# Patient Record
Sex: Female | Born: 1945 | Race: Black or African American | Hispanic: No | Smoking: Never smoker
Health system: Southern US, Community
[De-identification: ages and names within clinical notes are randomized; demographics above are authoritative.]

## PROBLEM LIST (undated history)

## (undated) DIAGNOSIS — J45909 Unspecified asthma, uncomplicated: Secondary | ICD-10-CM

## (undated) DIAGNOSIS — R011 Cardiac murmur, unspecified: Secondary | ICD-10-CM

## (undated) DIAGNOSIS — K5790 Diverticulosis of intestine, part unspecified, without perforation or abscess without bleeding: Secondary | ICD-10-CM

## (undated) DIAGNOSIS — G4730 Sleep apnea, unspecified: Secondary | ICD-10-CM

## (undated) DIAGNOSIS — K635 Polyp of colon: Secondary | ICD-10-CM

## (undated) DIAGNOSIS — K219 Gastro-esophageal reflux disease without esophagitis: Secondary | ICD-10-CM

## (undated) DIAGNOSIS — I10 Essential (primary) hypertension: Secondary | ICD-10-CM

## (undated) DIAGNOSIS — E785 Hyperlipidemia, unspecified: Secondary | ICD-10-CM

## (undated) HISTORY — PX: OTHER SURGICAL HISTORY: SHX169

## (undated) HISTORY — DX: Gastro-esophageal reflux disease without esophagitis: K21.9

## (undated) HISTORY — PX: TONSILLECTOMY: SUR1361

## (undated) HISTORY — DX: Polyp of colon: K63.5

## (undated) HISTORY — PX: DILATION AND CURETTAGE OF UTERUS: SHX78

## (undated) HISTORY — DX: Cardiac murmur, unspecified: R01.1

## (undated) HISTORY — PX: CATARACT EXTRACTION: SUR2

## (undated) HISTORY — DX: Diverticulosis of intestine, part unspecified, without perforation or abscess without bleeding: K57.90

## (undated) HISTORY — DX: Essential (primary) hypertension: I10

## (undated) HISTORY — DX: Hyperlipidemia, unspecified: E78.5

## (undated) HISTORY — PX: BREAST EXCISIONAL BIOPSY: SUR124

## (undated) HISTORY — DX: Sleep apnea, unspecified: G47.30

## (undated) HISTORY — PX: ROTATOR CUFF REPAIR: SHX139

## (undated) HISTORY — PX: OVARIAN CYST REMOVAL: SHX89

## (undated) HISTORY — DX: Unspecified asthma, uncomplicated: J45.909

---

## 1996-01-09 HISTORY — PX: COLONOSCOPY W/ POLYPECTOMY: SHX1380

## 1997-01-08 DIAGNOSIS — K635 Polyp of colon: Secondary | ICD-10-CM

## 1997-01-08 HISTORY — DX: Polyp of colon: K63.5

## 1997-06-11 ENCOUNTER — Other Ambulatory Visit: Admission: RE | Admit: 1997-06-11 | Discharge: 1997-06-11 | Payer: Self-pay | Admitting: *Deleted

## 1998-06-14 ENCOUNTER — Other Ambulatory Visit: Admission: RE | Admit: 1998-06-14 | Discharge: 1998-06-14 | Payer: Self-pay | Admitting: *Deleted

## 1998-10-14 ENCOUNTER — Encounter (INDEPENDENT_AMBULATORY_CARE_PROVIDER_SITE_OTHER): Payer: Self-pay | Admitting: Specialist

## 1998-10-14 ENCOUNTER — Other Ambulatory Visit: Admission: RE | Admit: 1998-10-14 | Discharge: 1998-10-14 | Payer: Self-pay | Admitting: *Deleted

## 1999-01-11 ENCOUNTER — Ambulatory Visit (HOSPITAL_COMMUNITY): Admission: RE | Admit: 1999-01-11 | Discharge: 1999-01-11 | Payer: Self-pay | Admitting: Internal Medicine

## 1999-01-11 ENCOUNTER — Encounter: Payer: Self-pay | Admitting: Internal Medicine

## 1999-03-27 ENCOUNTER — Encounter: Payer: Self-pay | Admitting: *Deleted

## 1999-03-27 ENCOUNTER — Encounter: Admission: RE | Admit: 1999-03-27 | Discharge: 1999-03-27 | Payer: Self-pay | Admitting: *Deleted

## 1999-05-26 ENCOUNTER — Ambulatory Visit (HOSPITAL_COMMUNITY): Admission: RE | Admit: 1999-05-26 | Discharge: 1999-05-26 | Payer: Self-pay | Admitting: Gastroenterology

## 1999-07-25 ENCOUNTER — Other Ambulatory Visit: Admission: RE | Admit: 1999-07-25 | Discharge: 1999-07-25 | Payer: Self-pay | Admitting: *Deleted

## 1999-11-24 ENCOUNTER — Other Ambulatory Visit: Admission: RE | Admit: 1999-11-24 | Discharge: 1999-11-24 | Payer: Self-pay | Admitting: *Deleted

## 1999-11-24 ENCOUNTER — Encounter (INDEPENDENT_AMBULATORY_CARE_PROVIDER_SITE_OTHER): Payer: Self-pay | Admitting: Specialist

## 2000-03-28 ENCOUNTER — Encounter: Admission: RE | Admit: 2000-03-28 | Discharge: 2000-03-28 | Payer: Self-pay | Admitting: *Deleted

## 2000-03-28 ENCOUNTER — Encounter: Payer: Self-pay | Admitting: *Deleted

## 2000-07-31 ENCOUNTER — Other Ambulatory Visit: Admission: RE | Admit: 2000-07-31 | Discharge: 2000-07-31 | Payer: Self-pay | Admitting: *Deleted

## 2001-04-17 ENCOUNTER — Encounter: Payer: Self-pay | Admitting: *Deleted

## 2001-04-17 ENCOUNTER — Encounter: Admission: RE | Admit: 2001-04-17 | Discharge: 2001-04-17 | Payer: Self-pay | Admitting: *Deleted

## 2001-04-30 ENCOUNTER — Ambulatory Visit (HOSPITAL_COMMUNITY): Admission: RE | Admit: 2001-04-30 | Discharge: 2001-04-30 | Payer: Self-pay | Admitting: Gastroenterology

## 2001-07-30 ENCOUNTER — Other Ambulatory Visit: Admission: RE | Admit: 2001-07-30 | Discharge: 2001-07-30 | Payer: Self-pay | Admitting: *Deleted

## 2002-04-23 ENCOUNTER — Encounter: Payer: Self-pay | Admitting: *Deleted

## 2002-04-23 ENCOUNTER — Encounter: Admission: RE | Admit: 2002-04-23 | Discharge: 2002-04-23 | Payer: Self-pay | Admitting: *Deleted

## 2002-07-21 ENCOUNTER — Other Ambulatory Visit: Admission: RE | Admit: 2002-07-21 | Discharge: 2002-07-21 | Payer: Self-pay | Admitting: *Deleted

## 2003-04-26 ENCOUNTER — Ambulatory Visit (HOSPITAL_COMMUNITY): Admission: RE | Admit: 2003-04-26 | Discharge: 2003-04-26 | Payer: Self-pay | Admitting: *Deleted

## 2003-04-29 ENCOUNTER — Encounter: Admission: RE | Admit: 2003-04-29 | Discharge: 2003-04-29 | Payer: Self-pay | Admitting: *Deleted

## 2003-08-19 ENCOUNTER — Other Ambulatory Visit: Admission: RE | Admit: 2003-08-19 | Discharge: 2003-08-19 | Payer: Self-pay | Admitting: *Deleted

## 2004-05-01 ENCOUNTER — Encounter: Admission: RE | Admit: 2004-05-01 | Discharge: 2004-05-01 | Payer: Self-pay | Admitting: *Deleted

## 2004-08-31 ENCOUNTER — Other Ambulatory Visit: Admission: RE | Admit: 2004-08-31 | Discharge: 2004-08-31 | Payer: Self-pay | Admitting: *Deleted

## 2004-09-28 ENCOUNTER — Ambulatory Visit: Payer: Self-pay | Admitting: Internal Medicine

## 2005-05-16 ENCOUNTER — Encounter: Admission: RE | Admit: 2005-05-16 | Discharge: 2005-05-16 | Payer: Self-pay | Admitting: *Deleted

## 2005-09-18 ENCOUNTER — Ambulatory Visit: Payer: Self-pay | Admitting: Internal Medicine

## 2005-09-28 ENCOUNTER — Ambulatory Visit: Payer: Self-pay | Admitting: Family Medicine

## 2005-10-09 ENCOUNTER — Ambulatory Visit: Payer: Self-pay | Admitting: Internal Medicine

## 2005-10-29 ENCOUNTER — Other Ambulatory Visit: Admission: RE | Admit: 2005-10-29 | Discharge: 2005-10-29 | Payer: Self-pay | Admitting: *Deleted

## 2006-01-29 ENCOUNTER — Ambulatory Visit: Payer: Self-pay | Admitting: Internal Medicine

## 2006-02-12 ENCOUNTER — Ambulatory Visit: Payer: Self-pay | Admitting: Internal Medicine

## 2006-02-12 LAB — CONVERTED CEMR LAB
ALT: 13 units/L (ref 0–40)
Cholesterol: 185 mg/dL (ref 0–200)
HDL: 60 mg/dL (ref >39.0)
LDL Cholesterol: 107 mg/dL — ABNORMAL HIGH (ref 0–99)

## 2006-03-20 ENCOUNTER — Emergency Department (HOSPITAL_COMMUNITY): Admission: EM | Admit: 2006-03-20 | Discharge: 2006-03-20 | Payer: Self-pay | Admitting: Family Medicine

## 2006-05-22 ENCOUNTER — Encounter: Admission: RE | Admit: 2006-05-22 | Discharge: 2006-05-22 | Payer: Self-pay | Admitting: *Deleted

## 2006-06-04 DIAGNOSIS — I10 Essential (primary) hypertension: Secondary | ICD-10-CM | POA: Insufficient documentation

## 2006-06-04 DIAGNOSIS — E785 Hyperlipidemia, unspecified: Secondary | ICD-10-CM

## 2006-06-04 DIAGNOSIS — Z86010 Personal history of colonic polyps: Secondary | ICD-10-CM | POA: Insufficient documentation

## 2006-06-21 ENCOUNTER — Telehealth (INDEPENDENT_AMBULATORY_CARE_PROVIDER_SITE_OTHER): Payer: Self-pay | Admitting: *Deleted

## 2006-07-24 ENCOUNTER — Ambulatory Visit: Payer: Self-pay | Admitting: Internal Medicine

## 2006-09-03 ENCOUNTER — Ambulatory Visit: Payer: Self-pay | Admitting: Internal Medicine

## 2006-09-03 ENCOUNTER — Telehealth (INDEPENDENT_AMBULATORY_CARE_PROVIDER_SITE_OTHER): Payer: Self-pay | Admitting: *Deleted

## 2006-09-03 DIAGNOSIS — R011 Cardiac murmur, unspecified: Secondary | ICD-10-CM

## 2006-09-05 ENCOUNTER — Encounter (INDEPENDENT_AMBULATORY_CARE_PROVIDER_SITE_OTHER): Payer: Self-pay | Admitting: *Deleted

## 2006-09-10 ENCOUNTER — Encounter (INDEPENDENT_AMBULATORY_CARE_PROVIDER_SITE_OTHER): Payer: Self-pay | Admitting: *Deleted

## 2006-09-11 ENCOUNTER — Encounter: Payer: Self-pay | Admitting: Internal Medicine

## 2006-09-11 ENCOUNTER — Ambulatory Visit: Payer: Self-pay

## 2006-09-13 ENCOUNTER — Encounter (INDEPENDENT_AMBULATORY_CARE_PROVIDER_SITE_OTHER): Payer: Self-pay | Admitting: *Deleted

## 2006-09-16 ENCOUNTER — Encounter: Payer: Self-pay | Admitting: Internal Medicine

## 2006-09-17 ENCOUNTER — Encounter (INDEPENDENT_AMBULATORY_CARE_PROVIDER_SITE_OTHER): Payer: Self-pay | Admitting: *Deleted

## 2006-09-19 ENCOUNTER — Ambulatory Visit: Payer: Self-pay

## 2006-09-19 ENCOUNTER — Encounter: Payer: Self-pay | Admitting: Internal Medicine

## 2006-09-23 ENCOUNTER — Encounter (INDEPENDENT_AMBULATORY_CARE_PROVIDER_SITE_OTHER): Payer: Self-pay | Admitting: *Deleted

## 2006-10-30 ENCOUNTER — Other Ambulatory Visit: Admission: RE | Admit: 2006-10-30 | Discharge: 2006-10-30 | Payer: Self-pay | Admitting: *Deleted

## 2006-11-04 ENCOUNTER — Telehealth (INDEPENDENT_AMBULATORY_CARE_PROVIDER_SITE_OTHER): Payer: Self-pay | Admitting: *Deleted

## 2006-12-10 ENCOUNTER — Other Ambulatory Visit: Admission: RE | Admit: 2006-12-10 | Discharge: 2006-12-10 | Payer: Self-pay | Admitting: *Deleted

## 2006-12-16 ENCOUNTER — Telehealth (INDEPENDENT_AMBULATORY_CARE_PROVIDER_SITE_OTHER): Payer: Self-pay | Admitting: *Deleted

## 2006-12-17 ENCOUNTER — Telehealth (INDEPENDENT_AMBULATORY_CARE_PROVIDER_SITE_OTHER): Payer: Self-pay | Admitting: *Deleted

## 2007-05-23 ENCOUNTER — Ambulatory Visit: Payer: Self-pay | Admitting: Internal Medicine

## 2007-05-26 ENCOUNTER — Encounter (INDEPENDENT_AMBULATORY_CARE_PROVIDER_SITE_OTHER): Payer: Self-pay | Admitting: *Deleted

## 2007-05-28 ENCOUNTER — Encounter: Admission: RE | Admit: 2007-05-28 | Discharge: 2007-05-28 | Payer: Self-pay | Admitting: Gynecology

## 2007-05-29 ENCOUNTER — Encounter: Payer: Self-pay | Admitting: Internal Medicine

## 2007-06-06 ENCOUNTER — Telehealth (INDEPENDENT_AMBULATORY_CARE_PROVIDER_SITE_OTHER): Payer: Self-pay | Admitting: *Deleted

## 2007-06-10 ENCOUNTER — Ambulatory Visit: Payer: Self-pay | Admitting: Internal Medicine

## 2007-06-12 ENCOUNTER — Encounter (INDEPENDENT_AMBULATORY_CARE_PROVIDER_SITE_OTHER): Payer: Self-pay | Admitting: *Deleted

## 2007-06-12 LAB — CONVERTED CEMR LAB
ALT: 13 units/L (ref 0–35)
AST: 22 units/L (ref 0–37)
Direct LDL: 119.7 mg/dL
Hgb A1c MFr Bld: 5.5 % (ref 4.6–6.0)
Triglycerides: 86 mg/dL (ref 0–149)
VLDL: 17 mg/dL (ref 0–40)

## 2007-06-18 ENCOUNTER — Telehealth (INDEPENDENT_AMBULATORY_CARE_PROVIDER_SITE_OTHER): Payer: Self-pay | Admitting: *Deleted

## 2007-08-27 ENCOUNTER — Telehealth (INDEPENDENT_AMBULATORY_CARE_PROVIDER_SITE_OTHER): Payer: Self-pay | Admitting: *Deleted

## 2007-10-15 ENCOUNTER — Encounter (INDEPENDENT_AMBULATORY_CARE_PROVIDER_SITE_OTHER): Payer: Self-pay | Admitting: *Deleted

## 2007-11-11 ENCOUNTER — Encounter: Payer: Self-pay | Admitting: Internal Medicine

## 2007-11-11 ENCOUNTER — Ambulatory Visit: Payer: Self-pay | Admitting: Internal Medicine

## 2007-11-17 ENCOUNTER — Other Ambulatory Visit: Admission: RE | Admit: 2007-11-17 | Discharge: 2007-11-17 | Payer: Self-pay | Admitting: Gynecology

## 2007-11-26 ENCOUNTER — Encounter (INDEPENDENT_AMBULATORY_CARE_PROVIDER_SITE_OTHER): Payer: Self-pay | Admitting: *Deleted

## 2008-03-01 ENCOUNTER — Telehealth (INDEPENDENT_AMBULATORY_CARE_PROVIDER_SITE_OTHER): Payer: Self-pay | Admitting: *Deleted

## 2008-04-19 ENCOUNTER — Ambulatory Visit: Payer: Self-pay | Admitting: Internal Medicine

## 2008-04-19 DIAGNOSIS — IMO0002 Reserved for concepts with insufficient information to code with codable children: Secondary | ICD-10-CM | POA: Insufficient documentation

## 2008-04-21 ENCOUNTER — Ambulatory Visit: Payer: Self-pay | Admitting: Internal Medicine

## 2008-04-22 ENCOUNTER — Encounter (INDEPENDENT_AMBULATORY_CARE_PROVIDER_SITE_OTHER): Payer: Self-pay | Admitting: *Deleted

## 2008-06-01 ENCOUNTER — Encounter: Admission: RE | Admit: 2008-06-01 | Discharge: 2008-06-01 | Payer: Self-pay | Admitting: Gynecology

## 2008-07-10 ENCOUNTER — Emergency Department (HOSPITAL_COMMUNITY): Admission: EM | Admit: 2008-07-10 | Discharge: 2008-07-10 | Payer: Self-pay | Admitting: Emergency Medicine

## 2008-07-16 ENCOUNTER — Telehealth (INDEPENDENT_AMBULATORY_CARE_PROVIDER_SITE_OTHER): Payer: Self-pay | Admitting: *Deleted

## 2008-07-21 ENCOUNTER — Ambulatory Visit: Payer: Self-pay | Admitting: Internal Medicine

## 2008-07-25 LAB — CONVERTED CEMR LAB
HDL: 64.4 mg/dL (ref >39.00)
Total CHOL/HDL Ratio: 3
Triglycerides: 78 mg/dL (ref 0.0–149.0)
VLDL: 15.6 mg/dL (ref 0.0–40.0)

## 2008-07-26 ENCOUNTER — Encounter (INDEPENDENT_AMBULATORY_CARE_PROVIDER_SITE_OTHER): Payer: Self-pay | Admitting: *Deleted

## 2008-07-29 ENCOUNTER — Ambulatory Visit: Payer: Self-pay | Admitting: Internal Medicine

## 2008-07-30 ENCOUNTER — Encounter: Payer: Self-pay | Admitting: Internal Medicine

## 2008-10-21 ENCOUNTER — Telehealth (INDEPENDENT_AMBULATORY_CARE_PROVIDER_SITE_OTHER): Payer: Self-pay | Admitting: *Deleted

## 2008-11-22 ENCOUNTER — Encounter: Payer: Self-pay | Admitting: Internal Medicine

## 2009-01-08 HISTORY — PX: OTHER SURGICAL HISTORY: SHX169

## 2009-02-22 ENCOUNTER — Emergency Department (HOSPITAL_COMMUNITY): Admission: EM | Admit: 2009-02-22 | Discharge: 2009-02-22 | Payer: Self-pay | Admitting: Family Medicine

## 2009-05-16 ENCOUNTER — Telehealth (INDEPENDENT_AMBULATORY_CARE_PROVIDER_SITE_OTHER): Payer: Self-pay | Admitting: *Deleted

## 2009-05-26 ENCOUNTER — Encounter: Payer: Self-pay | Admitting: Internal Medicine

## 2009-06-03 ENCOUNTER — Encounter: Admission: RE | Admit: 2009-06-03 | Discharge: 2009-06-03 | Payer: Self-pay | Admitting: Gynecology

## 2009-06-13 ENCOUNTER — Telehealth (INDEPENDENT_AMBULATORY_CARE_PROVIDER_SITE_OTHER): Payer: Self-pay | Admitting: *Deleted

## 2009-06-25 ENCOUNTER — Emergency Department (HOSPITAL_COMMUNITY): Admission: EM | Admit: 2009-06-25 | Discharge: 2009-06-25 | Payer: Self-pay | Admitting: Family Medicine

## 2009-07-12 ENCOUNTER — Telehealth (INDEPENDENT_AMBULATORY_CARE_PROVIDER_SITE_OTHER): Payer: Self-pay | Admitting: *Deleted

## 2009-08-01 ENCOUNTER — Ambulatory Visit: Payer: Self-pay | Admitting: Internal Medicine

## 2009-08-09 LAB — CONVERTED CEMR LAB
ALT: 12 units/L (ref 0–35)
AST: 18 units/L (ref 0–37)
Albumin: 4.4 g/dL (ref 3.5–5.2)
Basophils Absolute: 0 10*3/uL (ref 0.0–0.1)
Bilirubin, Direct: 0.2 mg/dL (ref 0.0–0.3)
CO2: 28 meq/L (ref 19–32)
Calcium: 9.3 mg/dL (ref 8.4–10.5)
Chloride: 107 meq/L (ref 96–112)
Cholesterol: 196 mg/dL (ref 0–200)
Eosinophils Relative: 1 % (ref 0.0–5.0)
Glucose, Bld: 102 mg/dL — ABNORMAL HIGH (ref 70–99)
HCT: 39.2 % (ref 36.0–46.0)
Hemoglobin: 13.6 g/dL (ref 12.0–15.0)
MCHC: 34.7 g/dL (ref 30.0–36.0)
MCV: 88.3 fL (ref 78.0–100.0)
Monocytes Absolute: 0.4 10*3/uL (ref 0.1–1.0)
RBC: 4.43 M/uL (ref 3.87–5.11)
RDW: 13.4 % (ref 11.5–14.6)
Sodium: 139 meq/L (ref 135–145)
TSH: 1.41 microintl units/mL (ref 0.35–5.50)
Total Bilirubin: 1 mg/dL (ref 0.3–1.2)
Total CHOL/HDL Ratio: 3
VLDL: 16.2 mg/dL (ref 0.0–40.0)

## 2009-08-12 ENCOUNTER — Ambulatory Visit: Payer: Self-pay | Admitting: Internal Medicine

## 2009-08-12 DIAGNOSIS — K5730 Diverticulosis of large intestine without perforation or abscess without bleeding: Secondary | ICD-10-CM | POA: Insufficient documentation

## 2009-08-12 DIAGNOSIS — N959 Unspecified menopausal and perimenopausal disorder: Secondary | ICD-10-CM | POA: Insufficient documentation

## 2009-09-13 ENCOUNTER — Encounter: Payer: Self-pay | Admitting: Internal Medicine

## 2009-11-25 ENCOUNTER — Ambulatory Visit: Payer: Self-pay | Admitting: Internal Medicine

## 2009-11-25 LAB — CONVERTED CEMR LAB
OCCULT 1: NEGATIVE
OCCULT 3: NEGATIVE

## 2009-11-28 ENCOUNTER — Encounter (INDEPENDENT_AMBULATORY_CARE_PROVIDER_SITE_OTHER): Payer: Self-pay | Admitting: *Deleted

## 2009-12-20 ENCOUNTER — Encounter: Payer: Self-pay | Admitting: Internal Medicine

## 2009-12-20 ENCOUNTER — Ambulatory Visit: Payer: Self-pay | Admitting: Internal Medicine

## 2010-02-05 LAB — CONVERTED CEMR LAB
CK-MB: 1.6 ng/mL (ref 0.3–4.0)
LDL Goal: 160 mg/dL

## 2010-02-09 NOTE — Medication Information (Signed)
Summary: Nonadherence with Labetalol/[facility]  Nonadherence with Labetalol/[facility]   Imported By: Lanelle Bal 06/07/2009 12:16:13  _____________________________________________________________________  External Attachment:    Type:   Image     Comment:   External Document

## 2010-02-09 NOTE — Progress Notes (Signed)
Summary: refill   Phone Note Refill Request Message from:  Patient on May 16, 2009 3:18 PM  Refills Requested: Medication #1:  LABETALOL HCL 300 MG  TABS 1 by mouth bid patient want rx for 90 day supply for mail order - mail rx to  patient   Method Requested: Mail to Patient Next Appointment Scheduled: [id number] cpx Initial call taken by: Okey Regal Spring,  May 16, 2009 3:28 PM  Follow-up for Phone Call        RX Mailed Follow-up by: Shonna Chock,  May 16, 2009 4:26 PM    Prescriptions: LABETALOL HCL 300 MG  TABS (LABETALOL HCL) 1 by mouth bid  #180 x 1   Entered by:   Shonna Chock   Authorized by:   Marga Melnick MD   Signed by:   Shonna Chock on 05/16/2009   Method used:   Print then Mail to Patient   RxID:   [id number]

## 2010-02-09 NOTE — Progress Notes (Signed)
Summary: Med Concerns  Phone Note Call from Patient Call back at Work Phone [phone]   Caller: Patient Summary of Call: Labetolol (Ivax)  is not being disgested properly, she is seeing of the pill in her stool. Her lips are also tingling, but not swollen. Normally they send her Evelene Croon instead. [facility] [address] [facility] rx and also called to [facility] at [phone], option 2.  Initial call taken by: Harold Barban,  June 13, 2009 12:53 PM  Follow-up for Phone Call        patient called back - she said to [facility] [address]  - the mfg is sandoz & [facility] m[facility]  Follow-up by: Okey Regal Spring,  June 14, 2009 8:53 AM  Additional Follow-up for Phone Call Additional follow up Details #1::        Spoke with patient and was told to disreguard info about [facility] and to just [facility] rx [facility] [address]. Patient indicated that she already checked with them and they have the manufactor that she previously had. [facility] manufactors and she has had problems every since like GI problems and Lip swelling, I offerd patient OV and she said she will wait and she how she recovers and call for OV if needed Additional Follow-up by: Shonna Chock,  June 14, 2009 10:05 AM    Prescriptions: LABETALOL HCL 300 MG  TABS (LABETALOL HCL) 1 by mouth bid  #180 x 1   Entered by:   Shonna Chock   Authorized by:   Marga Melnick MD   Signed by:   Shonna Chock on 06/14/2009   Method used:   Electronically to        [facility] [phone][facility] (retail)       [address]       [place], [place]  [id number]       Ph: [phone] or [phone]       Fax: [phone]   RxID:   [id number]

## 2010-02-09 NOTE — Miscellaneous (Signed)
Summary: Orders Update  Clinical Lists Changes  Problems: Added new problem of POSTMENOPAUSAL SYNDROME (ICD-627.9) Orders: Added new Referral order of Radiology Referral (Radiology) - Signed 

## 2010-02-09 NOTE — Progress Notes (Signed)
Summary: NEED LABS FOR 7/25 APPOINTMENT  Phone Note Call from Patient   Caller: Patient Summary of Call: PATIENT HAS APPOINTMENT TO SEE DR HOPPER FOR YEARLY EXAM ON 8/5----HAS LABS SCHEDULED FOR 7/25---  WHAT LABS SHOULD SHE HAVE ON 7/25  ??   AND I WILL PUT THEM ON HER LAB APPOINTMENT   Initial call taken by: Jerolyn Shin,  July 12, 2009 9:34 AM  Follow-up for Phone Call        V70.0, 401.9,272.4, 211.3, 995.20(lipids, BMET, CBC & dif, TSH, hep panel) Follow-up by: Marga Melnick MD,  July 13, 2009 6:02 AM  Additional Follow-up for Phone Call Additional follow up Details #1::        ADDED LABS TO APPT Additional Follow-up by: Jerolyn Shin,  July 13, 2009 3:33 PM

## 2010-02-09 NOTE — Letter (Signed)
Summary: Results Follow up Letter  [facility]  [address] [place], [place] [address]   Phone: [phone]  Fax: [phone]    11/28/2009 MRN: [id number]  [name] [address] [place], [place]  [address]  Dear Ms. Sydney Robinson,  The following are the results of your recent test(s):  Test         Result    Pap Smear:        Normal _____  Not Normal _____ Comments: ______________________________________________________ Cholesterol: LDL(Bad cholesterol):         Your goal is less than:         HDL (Good cholesterol):       Your goal is more than: Comments:  ______________________________________________________ Mammogram:        Normal _____  Not Normal _____ Comments:  ___________________________________________________________________ Hemoccult:        Normal _X____  Not normal _______ Comments:    _____________________________________________________________________ Other Tests:    We routinely do not discuss normal results over the telephone.  If you desire a copy of the results, or you have any questions about this information we can discuss them at your next office visit.   Sincerely,

## 2010-02-09 NOTE — Letter (Signed)
Summary: Cancer Screening/[facility] Personalized Risk Profile  Cancer Screening/[facility] Risk Profile   Imported By: Lanelle Bal 08/23/2009 09:32:57  _____________________________________________________________________  External Attachment:    Type:   Image     Comment:   External Document

## 2010-02-09 NOTE — Assessment & Plan Note (Signed)
Summary: cpx   Vital Signs:  Patient profile:   65 year old female Height:      67 inches Weight:      200.4 pounds BMI:     31.50 Temp:     98.6 degrees F oral Pulse rate:   63 / minute Resp:     16 per minute BP sitting:   127 / 79  (left arm) Cuff size:   large  Vitals Entered By: Shonna Chock CMA (August 12, 2009 9:52 AM)  CC: Lipid Management   CC:  Lipid Management.  History of Present Illness: Sydney Robinson is here for a physical; she is asymptomatic except for some edema. Her daughter's health issues have been a stress.  Hypertension Follow-Up      This is a 65 year old woman who presents for Hypertension follow-up.  The patient reports lightheadedness, but denies urinary frequency, headaches, and fatigue.  Associated symptoms include pedal edema.  The patient denies the following associated symptoms: chest pain, chest pressure, exercise intolerance, dyspnea, palpitations, syncope, and leg edema.  Compliance with medications (by patient report) has been near 100%.  The patient reports that dietary compliance has been fair.  Adjunctive measures currently used by the patient include salt restriction. BP 125-130/84 @ home.  Hyperlipidemia Follow-Up      The patient also presents for Hyperlipidemia follow-up.  The patient denies muscle aches, GI upset, abdominal pain, flushing, itching, constipation, and diarrhea.  The patient denies the following symptoms: syncope.  Compliance with medications (by patient report) has been near 100%.  Adjunctive measures currently used by the patient include fish oil supplements minimally.    Lipid Management History:      Positive NCEP/ATP III risk factors include female age 63 years old or older and hypertension.  Negative NCEP/ATP III risk factors include no history of early menopause without estrogen hormone replacement, non-diabetic, HDL cholesterol greater than 60, no family history for ischemic heart disease, non-tobacco-user status, no ASHD  (atherosclerotic heart disease), no prior stroke/TIA, no peripheral vascular disease, and no history of aortic aneurysm.     Preventive Screening-Counseling & Management  Caffeine-Diet-Exercise     Does Patient Exercise: yes  Allergies (verified): No Known Drug Allergies  Past History:  Past Medical History: Colonic polyps, PMH of, Dr Kinnie Scales 2003 Hyperlipidemia: [place] Study LDL goal = < 160. Hypertension MURMUR, CARDIAC, UNDIAGNOSED (ICD-785.2) Diverticulosis, colon, 2008  Past Surgical History: Fibroidectomy X3; D&C X3; G 2 P 2; Ovarian Cystectomy, Dr Randell Patient Colon polypectomy X1, adenomatous 2003; Sigmoid diverticula 2008; repeat colonoscopy 2013 Rotator cuff repair bilaterally  Tonsillectomy Neuroma resected R foot  Family History: Mother:  DM,Alsheimer's,CHF; M aunt: MI @ 67, P aunt :CVA @ 31; P aunt :uterine cancer; 2 sisters: breast cancer;1 sister: Diverticulitis;1 sister : ? gastroenteritis; MGM: DM;MGGM: DM; M aunts & uncles: DM; P aunt: DM  Social History: Never Smoked Alcohol use-no Retired Regular exercise-yes: walking 3-4 X/ week Married Does Patient Exercise:  yes  Review of Systems  The patient denies anorexia, fever, weight loss, weight gain, vision loss, decreased hearing, hoarseness, prolonged cough, hemoptysis, melena, hematochezia, severe indigestion/heartburn, hematuria, incontinence, suspicious skin lesions, depression, unusual weight change, abnormal bleeding, enlarged lymph nodes, and angioedema.    Physical Exam  General:  well-nourished; alert,appropriate and cooperative throughout examination Head:  Normocephalic and atraumatic without obvious abnormalities.  Eyes:  No corneal or conjunctival inflammation noted. EOMI. Perrla. Funduscopic exam benign, without hemorrhages, exudates or papilledema. Vision grossly normal. Ears:  External  ear exam shows no significant lesions or deformities.  Otoscopic examination reveals clear canals, tympanic  membranes are intact bilaterally without bulging, retraction, inflammation or discharge. Hearing is grossly normal bilaterally. Nose:  External nasal examination shows no deformity or inflammation. Nasal mucosa are pink and moist without lesions or exudates. Mouth:  Oral mucosa and oropharynx without lesions or exudates.  Teeth in good repair. Neck:  No deformities, masses, or tenderness noted. Thyroid full Lungs:  Normal respiratory effort, chest expands symmetrically. Lungs are clear to auscultation, no crackles or wheezes. Heart:  Normal rate and regular rhythm. S1 and S2 normal without gallop, murmur, click, rub . S4 with slurring Abdomen:  Bowel sounds positive,abdomen soft and non-tender without masses, organomegaly or hernias noted. Genitalia:  Dr Chevis Pretty Msk:  No deformity or scoliosis noted of thoracic or lumbar spine.   Pulses:  R and L carotid,radial,dorsalis pedis and posterior tibial pulses are full and equal bilaterally Extremities:  No clubbing, cyanosis, edema, or deformity noted with normal full range of motion of all joints.  Crepitus of knees , L > R Neurologic:  alert & oriented X3 and DTRs symmetrical and normal.   Skin:  Intact without suspicious lesions or rashes Cervical Nodes:  No lymphadenopathy noted Axillary Nodes:  No palpable lymphadenopathy Psych:  memory intact for recent and remote, normally interactive, and good eye contact.     Impression & Recommendations:  Problem # 1:  ROUTINE GENERAL MEDICAL EXAM@HEALTH  CARE FACL (ICD-V70.0)  Orders: EKG w/ Interpretation (93000)  Problem # 2:  HYPERTENSION (ICD-401.9) Controlled Her updated medication list for this problem includes:    Labetalol Hcl 300 Mg Tabs (Labetalol hcl) .Marland Kitchen... 1 by mouth bid  Problem # 3:  HYPERLIPIDEMIA (ICD-272.4) Lipids @ goal Her updated medication list for this problem includes:    Simvastatin 40 Mg Tabs (Simvastatin) .Marland Kitchen... 1 by mouth qd  Problem # 4:  COLONIC POLYPS, HX OF  (ICD-V12.72) as per Dr Kinnie Scales repeat colonoscopy 2013  Complete Medication List: 1)  Simvastatin 40 Mg Tabs (Simvastatin) .Marland Kitchen.. 1 by mouth qd 2)  Labetalol Hcl 300 Mg Tabs (Labetalol hcl) .Marland Kitchen.. 1 by mouth bid 3)  Zyrtec Allergy 10 Mg Tabs (Cetirizine hcl) .Marland Kitchen.. 1 by mouth once daily as needed 4)  Clonazepam 0.5 Mg Tabs (Clonazepam) .... 1/2 to 1 tab hs prn 5)  Multivitamin  6)  Fish Oil  7)  Cyclobenzaprine Hcl 5 Mg Tabs (Cyclobenzaprine hcl) .Marland Kitchen.. 1-2 at bedtime 8)  Caltrate 600 1500 Mg Tabs (Calcium carbonate) .Marland Kitchen.. 1 by mouth once daily  Lipid Assessment/Plan:      Based on NCEP/ATP III, the patient's risk factor category is "0-1 risk factors".  The patient's lipid goals are as follows: Total cholesterol goal is 200; LDL cholesterol goal is 160; HDL cholesterol goal is 40; Triglyceride goal is 150.  Her LDL cholesterol goal has been met.    Patient Instructions: 1)  Check your Blood Pressure regularly. If it is above: 135/85 ON AVERAGE you should make an appointment.Please complete stool cards. Prescriptions: CLONAZEPAM 0.5 MG  TABS (CLONAZEPAM) 1/2 to 1 tab hs prn  #30 x 5   Entered and Authorized by:   Marga Melnick MD   Signed by:   Marga Melnick MD on 08/12/2009   Method used:   Print then Give to Patient   RxID:   [id number] LABETALOL HCL 300 MG  TABS (LABETALOL HCL) 1 by mouth bid  #180 x 3   Entered and Authorized by:  Marga Melnick MD   Signed by:   Marga Melnick MD on 08/12/2009   Method used:   Print then Give to Patient   RxID:   [id number] SIMVASTATIN 40 MG  TABS (SIMVASTATIN) 1 by mouth qd  #90 x 3   Entered and Authorized by:   Marga Melnick MD   Signed by:   Marga Melnick MD on 08/12/2009   Method used:   Print then Give to Patient   RxID:   [id number]

## 2010-02-09 NOTE — Letter (Signed)
Summary: Results Follow up Letter  [facility]  [address] [place], [place] [address]   Phone: [phone]  Fax: [phone]    11/26/2007 MRN: [id number]  Sydney Robinson [address] [place], [place]  [address]  Dear Ms. Raul Del,  The following are the results of your recent test(s):  Test         Result    Pap Smear:        Normal _____  Not Normal _____ Comments: ______________________________________________________ Cholesterol: LDL(Bad cholesterol):         Your goal is less than:         HDL (Good cholesterol):       Your goal is more than: Comments:  ______________________________________________________ Mammogram:        Normal _____  Not Normal _____ Comments:  ___________________________________________________________________ Hemoccult:        Normal _____  Not normal _______ Comments:    _____________________________________________________________________ Other Tests: PLEASE SEE NORMAL BONE DENSITY TEST RESULTS ATTACHED    We routinely do not discuss normal results over the telephone.  If you desire a copy of the results, or you have any questions about this information we can discuss them at your next office visit.   Sincerely,

## 2010-02-09 NOTE — Miscellaneous (Signed)
Summary: BONE DENSITY  Clinical Lists Changes  Orders: Added new Test order of T-Bone Densitometry (77080) - Signed Added new Test order of T-Lumbar Vertebral Assessment (77082) - Signed 

## 2010-04-08 ENCOUNTER — Encounter: Payer: Self-pay | Admitting: Internal Medicine

## 2010-04-11 ENCOUNTER — Ambulatory Visit (INDEPENDENT_AMBULATORY_CARE_PROVIDER_SITE_OTHER): Payer: BC Managed Care – PPO | Admitting: Internal Medicine

## 2010-04-11 ENCOUNTER — Encounter: Payer: Self-pay | Admitting: Internal Medicine

## 2010-04-11 VITALS — BP 108/70 | HR 76 | Temp 98.7°F | Wt 202.4 lb

## 2010-04-11 DIAGNOSIS — D126 Benign neoplasm of colon, unspecified: Secondary | ICD-10-CM

## 2010-04-11 DIAGNOSIS — R198 Other specified symptoms and signs involving the digestive system and abdomen: Secondary | ICD-10-CM

## 2010-04-11 DIAGNOSIS — R194 Change in bowel habit: Secondary | ICD-10-CM

## 2010-04-11 NOTE — Patient Instructions (Signed)
Stop the Neosporin; it has a high risk for causing allergic reactions. Use the vitamin infused  A&D ointment as per the package label. Please complete the stool cards. Take Align once daily; this is a probiotic which will replace normal organisms which should be in the gut. Monitor to make sure that there is no change in bowels related to increased wheat product intake.

## 2010-04-11 NOTE — Progress Notes (Signed)
  Subjective:    Patient ID: Sydney Robinson, female    DOB: 04-08-1945, 65 y.o.   MRN: [id number]  HPI see below    Review of Systemssee below; also cracking @  Corner of mouth on L X 5 days,Rx: Neosporin     Objective:   Physical Exam Loose stool  Onset: 6-8 mos ago;Time course: stable over this period of time;Description: loose but not watery Number of stools per day: 2  Previous treatment:  decreased coffee intake w/o change; Pepto Bismol occasionally helped "extra loose stool" Vomiting: no  Abdominal Pain: no  Weight loss: no  Decreased urine output: no  Lightheadedness: not persistant  ( only related to BP meds)  Recent travel history: no    Sick contacts: no    Suspicious food or water: no (well water used only for cooking). Change in diet: no ;no relation to wheat; she is lactose intolerant & avoids milk except for occasional cheese   Red Flags Fever: no  Bloody stools: no  Recent antibiotics: no  Immuncompromised: no  but  PMH of pre cancerous polyps(Tubular Adenoma 1998) & diverticulosis. Repeat colonoscopy 2003 by Dr Kinnie Scales & by Dr Arlyce Dice  2008; FH of cancers:breast , ovarian , pancreatic , prostate . No FH of  colon cancer; sister had Diverticulitis.  PE: well nourished appearing;in no distress.Eye - Pupils Equal Round Reactive to light, Extraocular movements intact.No icterus. Conjunctiva without redness or discharge Neck:  No deformities, thyromegaly, masses, or tenderness noted.   Good dental hygiene; no oropharyngeal erythema;minor cheilosis corner L mouth..Heart:  Normal rate and regular rhythm. S1 and S2 normal without gallop, murmur, click, rub or other extra sounds.  Lungs:Chest clear to auscultation; no wheezes, rhonchi,rales ,or rubs present.No increased work of breathing. Abdomen: bowel sounds normal, soft and non-tender without masses, organomegaly or hernias noted.  No guarding or rebound Skin:  Intact without suspicious lesions or rashes. No jaundice. No LA  of neck or axilla.   Assessment & Plan:  #1 bowel changes with loose stool; no true diarrhea  #2 past medical history of adenomatous polyps in 1998  #3 cheilosis of lip  Plan: #1 CBC and differential will be collected. Align one daily will be recommended for bowel changes. Stool fecal occult blood will also be collected. Referral to GI will be pursued if this does not resolve her symptoms.  Vitamin A and D ointment will be recommended for the lip lesion. She was asked to stop the Neosporin because of increased risk of drug reaction.

## 2010-04-12 LAB — CBC WITH DIFFERENTIAL/PLATELET
Basophils Absolute: 0 10*3/uL (ref 0.0–0.1)
HCT: 39.7 % (ref 36.0–46.0)
Hemoglobin: 13.8 g/dL (ref 12.0–15.0)
MCHC: 34.6 g/dL (ref 30.0–36.0)
MCV: 88.3 fl (ref 78.0–100.0)
Monocytes Relative: 9.2 % (ref 3.0–12.0)
Neutro Abs: 2 10*3/uL (ref 1.4–7.7)
Neutrophils Relative %: 46.7 % (ref 43.0–77.0)
Platelets: 217 10*3/uL (ref 150.0–400.0)
RBC: 4.5 Mil/uL (ref 3.87–5.11)

## 2010-05-10 ENCOUNTER — Other Ambulatory Visit (INDEPENDENT_AMBULATORY_CARE_PROVIDER_SITE_OTHER): Payer: BC Managed Care – PPO

## 2010-05-10 DIAGNOSIS — Z1211 Encounter for screening for malignant neoplasm of colon: Secondary | ICD-10-CM

## 2010-05-10 LAB — HEMOCCULT GUIAC POC 1CARD (OFFICE)
Card #3 Fecal Occult Blood, POC: NEGATIVE
Fecal Occult Blood, POC: NEGATIVE

## 2010-05-18 ENCOUNTER — Other Ambulatory Visit: Payer: Self-pay | Admitting: Gynecology

## 2010-05-18 DIAGNOSIS — Z1231 Encounter for screening mammogram for malignant neoplasm of breast: Secondary | ICD-10-CM

## 2010-05-26 NOTE — Procedures (Signed)
[facility]  Patient:    LATISIA, HILAIRE                     MRN: [id number] Proc. Date: 05/26/99 Adm. Date:  [id number] Disc. Date: [id number] Attending:  Judeth Cornfield                           Procedure Report  HISTORY OF PRESENT ILLNESS:  Ms. Eliberto Ivory has a history of colon polyps. She was last studied 3 years ago. She has a sister with colon polyps. She has no current GI complaints.  INFORMED CONSENT:  The patient provided consent after risks, benefits, and alternatives were explained.  MEDICATIONS:  Versed 7.5, fentanyl 75 mcg IV.  DESCRIPTION OF PROCEDURE:  The patient was placed in the left lateral decubitus position, administered continuous low flow oxygen and was placed on pulse oximetry. The Olympus video colonoscope was inserted to the cecum which was identified by the ileocecal valve. The prep was good. The scope was then withdrawn and all areas of the colon were examined.  FINDINGS:  Normal colonoscopy to the cecum.  IMPRESSION:  Normal colonoscopy. History of colon polyps.  RECOMMENDATION: Follow-up colonoscopy in approximately 5 years. DD:  03/26/99 TD:  05/31/99 Job: [id number]

## 2010-06-07 ENCOUNTER — Ambulatory Visit
Admission: RE | Admit: 2010-06-07 | Discharge: 2010-06-07 | Disposition: A | Discharge status: Home / Self Care | Payer: BC Managed Care – PPO | Source: Ambulatory Visit | Attending: Gynecology | Admitting: Gynecology

## 2010-06-07 DIAGNOSIS — Z1231 Encounter for screening mammogram for malignant neoplasm of breast: Secondary | ICD-10-CM

## 2010-11-14 ENCOUNTER — Encounter: Payer: Self-pay | Admitting: Internal Medicine

## 2010-11-15 ENCOUNTER — Ambulatory Visit (INDEPENDENT_AMBULATORY_CARE_PROVIDER_SITE_OTHER): Payer: BC Managed Care – PPO | Admitting: Internal Medicine

## 2010-11-15 VITALS — BP 138/86 | HR 64 | Temp 98.3°F | Ht 66.5 in | Wt 199.0 lb

## 2010-11-15 DIAGNOSIS — I10 Essential (primary) hypertension: Secondary | ICD-10-CM

## 2010-11-15 DIAGNOSIS — D72819 Decreased white blood cell count, unspecified: Secondary | ICD-10-CM

## 2010-11-15 DIAGNOSIS — E785 Hyperlipidemia, unspecified: Secondary | ICD-10-CM

## 2010-11-15 DIAGNOSIS — G479 Sleep disorder, unspecified: Secondary | ICD-10-CM

## 2010-11-15 DIAGNOSIS — Z23 Encounter for immunization: Secondary | ICD-10-CM

## 2010-11-15 LAB — HEPATIC FUNCTION PANEL
Albumin: 4.4 g/dL (ref 3.5–5.2)
Total Bilirubin: 1.5 mg/dL — ABNORMAL HIGH (ref 0.3–1.2)

## 2010-11-15 LAB — CBC WITH DIFFERENTIAL/PLATELET
Basophils Relative: 0.4 % (ref 0.0–3.0)
Eosinophils Relative: 1.2 % (ref 0.0–5.0)
HCT: 39.3 % (ref 36.0–46.0)
MCV: 89.5 fl (ref 78.0–100.0)
Monocytes Absolute: 0.4 10*3/uL (ref 0.1–1.0)
Monocytes Relative: 9.6 % (ref 3.0–12.0)
Neutrophils Relative %: 47.7 % (ref 43.0–77.0)
Platelets: 208 10*3/uL (ref 150.0–400.0)
RBC: 4.39 Mil/uL (ref 3.87–5.11)
WBC: 4.3 10*3/uL — ABNORMAL LOW (ref 4.5–10.5)

## 2010-11-15 LAB — BASIC METABOLIC PANEL
BUN: 11 mg/dL (ref 6–23)
CO2: 28 mEq/L (ref 19–32)
Glucose, Bld: 84 mg/dL (ref 70–99)
Potassium: 4.2 mEq/L (ref 3.5–5.1)
Sodium: 143 mEq/L (ref 135–145)

## 2010-11-15 LAB — TSH: TSH: 1.24 u[IU]/mL (ref 0.35–5.50)

## 2010-11-15 LAB — LIPID PANEL
HDL: 69.8 mg/dL (ref >39.00)
LDL Cholesterol: 107 mg/dL — ABNORMAL HIGH (ref 0–99)
Total CHOL/HDL Ratio: 3
VLDL: 13.8 mg/dL (ref 0.0–40.0)

## 2010-11-15 MED ORDER — SIMVASTATIN 40 MG PO TABS: 40.0000 mg | ORAL_TABLET | Freq: Every day | ORAL | Status: DC

## 2010-11-15 MED ORDER — LABETALOL HCL 300 MG PO TABS: 300.0000 mg | ORAL_TABLET | Freq: Two times a day (BID) | ORAL | Status: DC

## 2010-11-15 MED ORDER — CLONAZEPAM 0.5 MG PO TABS: 0.5000 mg | ORAL_TABLET | ORAL | Status: DC

## 2010-11-15 NOTE — Patient Instructions (Signed)
Preventive Health Care: Exercise  30-45  minutes a day, 3-4 days a week. Walking is especially valuable in preventing Osteoporosis. Eat a low-fat diet with lots of fruits and vegetables, up to 7-9 servings per day. Consume less than 30 grams of sugar per day from foods & drinks with High Fructose Corn Syrup as # 1,2,3 or #4 on label. Blood Pressure Goal  Ideally is an AVERAGE < 135/85. This AVERAGE should be calculated from @ least 5-7 BP readings taken @ different times of day on different days of week. You should not respond to isolated BP readings , but rather the AVERAGE for that week To prevent sleep dysfunction follow these instructions for sleep hygiene. Do not read, watch TV, or eat in bed. Do not get into bed until you are ready to turn off the light &  to go to sleep. Do not ingest stimulants ( decongestants, diet pills, nicotine, caffeine) after the evening meal.

## 2010-11-15 NOTE — Progress Notes (Signed)
  Subjective:    Patient ID: Sydney Robinson, female    DOB: 12/06/1945, 65 y.o.   MRN: [id number]  HPI  HYPERTENSION: Disease Monitoring  Blood pressure range: < 135/85 on average; 117/72- 140/87  Chest pain: no   Dyspnea: no   Claudication: no   Medication compliance: yes,   Medication Side Effects  Lightheadedness: rarely when standing up   Urinary frequency: no   Edema: minor    Preventitive Healthcare:  Exercise: yes, walking 3-4 X/ week   Diet Pattern: no plan, heart healthy  Salt Restriction: yes     Review of Systems She questions the decrease in her white count on the last 2 CBCs and differentials. Review of of labs reveals that her white blood count has ranged from 4000-4200. In July 2011 she had an increase in lymphocytes at 46.5. This resolved in April 2012 .She denies fever, chills sweats, or weight loss.  She's been taking the clonazepam for sleep disorder on a very irregular basis to good effect.     Objective:   Physical Exam Gen.: Healthy and well-nourished in appearance. Alert, appropriate and cooperative throughout exam. Lungs: Normal respiratory effort; chest expands symmetrically. Lungs are clear to auscultation without rales, wheezes, or increased work of breathing. Heart: Normal rate and rhythm. Normal S1 and S2. No gallop, click, or rub. s 4 w/o  murmur. Abdomen: Bowel sounds normal; abdomen soft and nontender. No masses, organomegaly or hernias noted. No AAA or renal artery bruits                                                                                 Musculoskeletal/extremities:  No clubbing, cyanosis, edema, or deformity noted.  Nail health  good. Vascular: Carotid, radial artery, dorsalis pedis and  posterior tibial pulses are full and equal. No bruits present. Neurologic: Alert and oriented x3. Deep tendon reflexes symmetrical and normal.          Skin: Intact without suspicious lesions or rashes.  Psych: Mood and affect are normal.  Normally interactive                                                                                         Assessment & Plan:  #1 hypertension, control is excellent based on measurements  #2 reduced white blood count with essentially normal differential. No organomegaly or lymphadenopathy on exam  Plan: See orders and recommendations.

## 2011-04-01 ENCOUNTER — Emergency Department (INDEPENDENT_AMBULATORY_CARE_PROVIDER_SITE_OTHER)
Admission: EM | Admit: 2011-04-01 | Discharge: 2011-04-01 | Disposition: A | Discharge status: Home / Self Care | Payer: BC Managed Care – PPO | Source: Home / Self Care | Attending: Emergency Medicine | Admitting: Emergency Medicine

## 2011-04-01 ENCOUNTER — Emergency Department (INDEPENDENT_AMBULATORY_CARE_PROVIDER_SITE_OTHER): Payer: BC Managed Care – PPO

## 2011-04-01 ENCOUNTER — Encounter (HOSPITAL_COMMUNITY): Payer: Self-pay

## 2011-04-01 DIAGNOSIS — J45909 Unspecified asthma, uncomplicated: Secondary | ICD-10-CM

## 2011-04-01 MED ORDER — BENZONATATE 200 MG PO CAPS: 200.0000 mg | ORAL_CAPSULE | Freq: Three times a day (TID) | ORAL | Status: AC | PRN

## 2011-04-01 MED ORDER — AZITHROMYCIN 250 MG PO TABS: ORAL_TABLET | ORAL | Status: AC

## 2011-04-01 MED ORDER — ALBUTEROL SULFATE HFA 108 (90 BASE) MCG/ACT IN AERS: 1.0000 | INHALATION_SPRAY | Freq: Four times a day (QID) | RESPIRATORY_TRACT | Status: DC | PRN

## 2011-04-01 NOTE — ED Notes (Signed)
Pt has had cough for 3 days and has wheezing at hs

## 2011-04-01 NOTE — ED Provider Notes (Signed)
Chief Complaint  Patient presents with  . Cough    History of Present Illness:   Sydney Robinson is a 66 year old female who has had a four-day history of cough productive of yellowish, bloody drainage, wheezing at nighttime, ear congestion, sore throat, and nasal congestion with clear drainage. She also has had some headaches, but denies any fevers, chills, chest pain, nausea, vomiting, or diarrhea.  Review of Systems:  Other than noted above, the patient denies any of the following symptoms. Systemic:  No fever, chills, sweats, fatigue, myalgias, headache, or anorexia. Eye:  No redness, pain or drainage. ENT:  No earache, nasal congestion, rhinorrhea, sinus pressure, or sore throat. Lungs:  No cough, sputum production, wheezing, shortness of breath. Or chest pain. GI:  No nausea, vomiting, abdominal pain or diarrhea. Skin:  No rash or itching.  PMFSH:  Past medical history, family history, social history, meds, and allergies were reviewed.  Physical Exam:   Vital signs:  BP 116/78  Pulse 65  Temp(Src) 98.5 F (36.9 C) (Oral)  Resp 16  SpO2 98% General:  Alert, in no distress. Eye:  No conjunctival injection or drainage. ENT:  TMs and canals were normal, without erythema or inflammation.  Nasal mucosa was clear and uncongested, without drainage.  Mucous membranes were moist.  Pharynx was clear, without exudate or drainage.  There were no oral ulcerations or lesions. Neck:  Supple, no adenopathy, tenderness or mass. Lungs:  No respiratory distress.  Lungs were clear to auscultation, without wheezes, rales or rhonchi.  Breath sounds were clear and equal bilaterally. Heart:  Regular rhythm, without gallops, murmers or rubs. Skin:  Clear, warm, and dry, without rash or lesions.   Radiology:  Dg Chest 2 View  04/01/2011  *RADIOLOGY REPORT*  Clinical Data: Cough, wheezing, hemoptysis, no fever  CHEST - 2 VIEW  Comparison: 07/10/2008  Findings: Grossly unchanged borderline enlarged cardiac  silhouette with persistent mild tortuosity/ectasia of the thoracic aorta.  No focal parenchymal opacities.  No pleural effusion or pneumothorax. No acute osseous abnormalities.  IMPRESSION: 1.  No acute cardiopulmonary disease.  2.  Grossly unchanged borderline cardiomegaly with mild tortuosity/ectasia of the thoracic aorta.  Original Report Authenticated By: Waynard Reeds, M.D.    Assessment:   Diagnoses that have been ruled out:  None  Diagnoses that are still under consideration:  None  Final diagnoses:  Asthmatic bronchitis      Plan:   1.  The following meds were prescribed:   New Prescriptions   ALBUTEROL (PROVENTIL HFA;VENTOLIN HFA) 108 (90 BASE) MCG/ACT INHALER    Inhale 1-2 puffs into the lungs every 6 (six) hours as needed for wheezing.   AZITHROMYCIN (ZITHROMAX Z-PAK) 250 MG TABLET    Take as directed.   BENZONATATE (TESSALON) 200 MG CAPSULE    Take 1 capsule (200 mg total) by mouth 3 (three) times daily as needed for cough.   2.  The patient was instructed in symptomatic care and handouts were given. 3.  The patient was told to return if becoming worse in any way, if no better in 3 or 4 days, and given some red flag symptoms that would indicate earlier return.   Reuben Likes, MD 04/01/11 [phone]

## 2011-04-01 NOTE — Discharge Instructions (Signed)
Using Your Inhaler 1. Take the cap off the mouthpiece.  2. Shake the inhaler for 5 seconds.  3. Turn the inhaler so the bottle is above the mouthpiece. Hold it away from your mouth, at a distance of the width of 2 fingers.  4. Open your mouth widely, and tilt your head back slightly. Let your breath out.  5. Take a deep breath in slowly through your mouth. At the same time, push down on the bottle 1 time. You will feel the medicine enter your mouth and throat as you breathe.  6. Continue to take a deep breath in very slowly.  7. After you have breathed in completely, hold your breath for 10 seconds. This will help the medicine to settle in your lungs. If you cannot hold your breath for 10 seconds, hold it for as long as you can before you breathe out.  8. If your doctor has told you to take more than 1 puff, wait at least 1 minute between puffs. This will help you get the best results from your medicine.  9. If you use a steroid inhaler, rinse out your mouth after each dose.  10. Wash your inhaler once a day. Remove the bottle from the mouthpiece. Rinse the mouthpiece and cap with warm water. Dry everything well before you put the inhaler back together.  Document Released: 10/04/2007 Document Revised: 12/14/2010 Document Reviewed: 10/12/2008 [facility] Patient Information 2012 [place], [place].  Most upper respiratory infections are caused by viruses and do not require antibiotics.  We try to save the antibiotics for when we really need them to avoid resistance.  This does not mean that there is nothing that can be done.  Here are a few hints about things that can be done at home to get over an upper respiratory infection quicker:  Get extra sleep and extra fluids.  Get 7 to 9 hours of sleep per night and 6 to 8 glasses of water a day.  Getting extra sleep keeps the immune system from getting run down.  Most people with an upper respiratory infection are a little dehydrated.  The extra fluids also keep  the secretions liquified and easier to deal with.  Also, get extra vitamin C.  4000 mg per day is the recommended dose. For the aches, headache, and fever, acetaminophen or ibuprofen are helpful.  These can be alternated every 4 hours.  People with liver disease should avoid large amounts of acetaminophen, and people with ulcer disease, gastroesophageal reflux, gastritis, congestive heart failure, chronic kidney disease, coronary artery disease and the elderly should avoid ibuprofen. For nasal congestion try Mucinex-D, or if you're having lots of sneezing or copious clear nasal drainage Allegra-D-24 hour.  A Saline nasal spray such as Ocean Spray can also help as can decongestant sprays such as Afrin, but you should not use the decongestant sprays for more than 3 or 4 days since they can be habituating.  If nasal dryness is a problem, Ayr Nasal Gel can help moisturize your nasal passages.  Breath Rite nasal strips can also offer a non-drug alternative treatment to nasal congestion, especially at night. For people with symptoms of sinusitis, sleeping with your head elevated can be helpful.  For sinus pain, moist, hot compresses to the face may provide some relief.  Many people find that inhaling steam as in a shower or from a pot of steaming water can help. For sore throat, zinc containing lozenges such as Cold-Eze or Zicam are helpful.  Zinc  helps to fight infection and has a mild astringent effect that relieves the sore, achey throat.  Hot salt water gargles (8 oz of hot water, 1/2 tsp of table salt, and a pinch of baking soda) can give relief as well as hot beverages such as hot tea. For the cough, old time remedies such as honey or honey and lemon are tried and true.  Over the counter cough syrups such as Delsym 2 tsp every 12 hours can help as well.  It's important when you have an upper respiratory infection not to pass the infection to others.  This involves being very careful about the  following:  Frequent hand washing or use of hand sanitizer, especially after coughing, sneezing, blowing your nose or touching your face, nose or eyes. Do not shake hands or touch anyone and try to avoid touching surfaces that other people use such as doorknobs, shopping carts, telephones and computer keyboards. Use tissues and dispose of them properly in a garbage can or ziplock bag. Cough into your sleeve. Do not let others eat or drink after you.  It's also important to recognize the signs of serious illness and get evaluated if they occur: Any respiratory infection that lasts more than 7 to 10 days.  Yellow nasal drainage and sputum are not reliable indicators of a bacterial infection, but if they last for more than 1 week, see your doctor. Fever and sore throat can indicate strep. Fever and cough can indicate influenza or pneumonia. Any kind of severe symptom such as difficulty breathing, intractable vomiting, or severe pain should prompt you to see a doctor as soon as possible.   Your body's immune system is really the thing that will get rid of this infection.  Your immune system is comprised of 2 types of specialized cells called T cells and B cells.  T cells coordinate the array of cells in your body that engulf invading bacteria or viruses while B cells orchestrate the production of antibodies that neutralize infection.  Anything we do or any medications we give you, will just strengthen your immune system or help it clear up the infection quicker.  Here are a few helpful hints to improve your immune system to help overcome this illness or to prevent future infections:  A few vitamins can improve the health of your immune system.  That's why your diet should include plenty of fruits, vegetables, fish, nuts, and whole grains.  Vitamin A and bet-carotene can increase the cells that fight infections (T cells and B cells).  Vitamin A is abundant in dark greens and orange vegetables such as  spinach, greens, sweet potatoes, and carrots.  Vitamin B6 contributes to the maturation of white blood cells, the cells that fight disease.  Foods with vitamin B6 include cold cereal and bananas.  Vitamin C is credited with preventing colds because it increases white blood cells and also prevents cellular damage.  Citrus fruits, peaches and green and red bell peppers are all hight in vitamin C.  Vitamin E is an anti-oxidant that encourages the production of natural killer cells which reject foreign invaders and B cells that produce antibodies.  Foods high in vitamin E include wheat germ, nuts and seeds.  Foods high in omega-3 fatty acids found in foods like salmon, tuna and mackerel boost your immune system and help cells to engulf and absorb germs.  Probiotics are good bacteria that increase your T cells.  These can be found in yogurt and are available in  supplements such as Culturelle or Align.  Moderate exercise increases the strength of your immune system and your ability to recover from illness.  I suggest 3 to 5 moderate intensity 30 minute workouts per week.    Sleep is another component of maintaining a strong immune system.  It enables your body to recuperate from the day's activities, stress and work.  My recommendation is to get between 7 and 9 hours of sleep per night.  If you smoke, try to quit completely or at least cut down.  Drink alcohol only in moderation if at all.  No more than 2 drinks daily for men or 1 for women.  Get a flu vaccine early in the fall or if you have not gotten one yet, once this illness has run its course.  If you are over 65, a smoker, or an asthmatic, get a pneumococcal vaccine.  My final recommendation is to maintain a healthy weight.  Excess weight can impair the immune system by interfering with the way the immune system deals with invading viruses or bacteria.

## 2011-04-30 ENCOUNTER — Telehealth: Payer: Self-pay | Admitting: Internal Medicine

## 2011-04-30 NOTE — Telephone Encounter (Signed)
All Dr.Hopper Records faxed to [facility]  @ [phone]  04/30/11/KM

## 2011-05-18 ENCOUNTER — Other Ambulatory Visit: Payer: Self-pay | Admitting: Gynecology

## 2011-05-18 DIAGNOSIS — Z1231 Encounter for screening mammogram for malignant neoplasm of breast: Secondary | ICD-10-CM

## 2011-06-07 ENCOUNTER — Encounter: Payer: Self-pay | Admitting: Family Medicine

## 2011-06-07 ENCOUNTER — Ambulatory Visit (INDEPENDENT_AMBULATORY_CARE_PROVIDER_SITE_OTHER): Payer: BC Managed Care – PPO | Admitting: Family Medicine

## 2011-06-07 VITALS — BP 112/70 | HR 77 | Temp 98.7°F | Wt 207.4 lb

## 2011-06-07 DIAGNOSIS — L255 Unspecified contact dermatitis due to plants, except food: Secondary | ICD-10-CM

## 2011-06-07 DIAGNOSIS — G479 Sleep disorder, unspecified: Secondary | ICD-10-CM

## 2011-06-07 DIAGNOSIS — L247 Irritant contact dermatitis due to plants, except food: Secondary | ICD-10-CM

## 2011-06-07 MED ORDER — CLONAZEPAM 0.5 MG PO TABS: 0.5000 mg | ORAL_TABLET | ORAL | Status: DC

## 2011-06-07 MED ORDER — PREDNISONE 10 MG PO TABS: ORAL_TABLET | ORAL | Status: DC

## 2011-06-07 NOTE — Patient Instructions (Signed)
Poison Ivy Poison ivy is a inflammation of the skin (contact dermatitis) caused by touching the allergens on the leaves of the ivy plant following previous exposure to the plant. The rash usually appears 48 hours after exposure. The rash is usually bumps (papules) or blisters (vesicles) in a linear pattern. Depending on your own sensitivity, the rash may simply cause redness and itching, or it may also progress to blisters which may break open. These must be well cared for to prevent secondary bacterial (germ) infection, followed by scarring. Keep any open areas dry, clean, dressed, and covered with an antibacterial ointment if needed. The eyes may also get puffy. The puffiness is worst in the morning and gets better as the day progresses. This dermatitis usually heals without scarring, within 2 to 3 weeks without treatment. HOME CARE INSTRUCTIONS  Thoroughly wash with soap and water as soon as you have been exposed to poison ivy. You have about one half hour to remove the plant resin before it will cause the rash. This washing will destroy the oil or antigen on the skin that is causing, or will cause, the rash. Be sure to wash under your fingernails as any plant resin there will continue to spread the rash. Do not rub skin vigorously when washing affected area. Poison ivy cannot spread if no oil from the plant remains on your body. A rash that has progressed to weeping sores will not spread the rash unless you have not washed thoroughly. It is also important to wash any clothes you have been wearing as these may carry active allergens. The rash will return if you wear the unwashed clothing, even several days later. Avoidance of the plant in the future is the best measure. Poison ivy plant can be recognized by the number of leaves. Generally, poison ivy has three leaves with flowering branches on a single stem. Diphenhydramine may be purchased over the counter and used as needed for itching. Do not drive with  this medication if it makes you drowsy.Ask your caregiver about medication for children. SEEK MEDICAL CARE IF:  Open sores develop.   Redness spreads beyond area of rash.   You notice purulent (pus-like) discharge.   You have increased pain.   Other signs of infection develop (such as fever).  Document Released: 12/23/1999 Document Revised: 12/14/2010 Document Reviewed: 11/10/2008 [employer] Patient Information 2012 [employer]. 

## 2011-06-07 NOTE — Progress Notes (Signed)
  Subjective:     Sydney Robinson is a 66 y.o. female who complains of a rash. Symptoms began several weeks ago. Patient describes the rash as erythematous, red, scattered. Characteristics of rash and associated history: Similar rash in the past? yes, Is rash pruritic?  yes, Alleviating factors? no. Patient's previous dermatologic history includes contact dermatitis: plants poison ivy. Family history of derm problems: contact dermatitis: plants poison ivy. Medications currently using: benadryl. Environmental exposures or allergies: poison ivy  The following portions of the patient's history were reviewed and updated as appropriate: allergies, current medications, past family history, past medical history, past social history, past surgical history and problem list.   Review of Systems Pertinent items are noted in HPI.    Objective:    BP 112/70  Pulse 77  Temp(Src) 98.7 F (37.1 C) (Oral)  Wt 207 lb 6.4 oz (94.076 kg)  SpO2 97% Physical Exam  General:  alert, cooperative, appears stated age and no distress  HEENT:  PERRLA and extra ocular movement intact  Lymph Nodes:  Cervical, supraclavicular, and axillary nodes normal.  Lungs:  clear to auscultation bilaterally  Heart:  regular rate and rhythm, S1, S2 normal, no murmur, click, rub or gallop  Extremities:  extremities normal, atraumatic, no cyanosis or edema  Skin:   Rash located on the abdomen, extremities, legs.   Shape:   linear   Consistency:   nontender  Color:   red  Type: papule(s) -arm(s) bilateral, abdomen, lower leg(s) bilateral  Size: severalmm    The remainder of the skin exam:  color normal, vascularity normal, no rashes or suspicious lesions  Laboratory: ;none      Assessment:    contact dermatitis: plants poison ivy    Plan:    1.  benadryl and steroids: prednisone taper 2.  Written patient instruction given. 3. Follow up as needed for acute illness

## 2011-06-15 ENCOUNTER — Ambulatory Visit: Payer: BC Managed Care – PPO

## 2011-06-18 ENCOUNTER — Ambulatory Visit
Admission: RE | Admit: 2011-06-18 | Discharge: 2011-06-18 | Disposition: A | Discharge status: Home / Self Care | Payer: BC Managed Care – PPO | Source: Ambulatory Visit | Attending: Gynecology | Admitting: Gynecology

## 2011-06-18 DIAGNOSIS — Z1231 Encounter for screening mammogram for malignant neoplasm of breast: Secondary | ICD-10-CM

## 2011-11-20 ENCOUNTER — Ambulatory Visit (INDEPENDENT_AMBULATORY_CARE_PROVIDER_SITE_OTHER): Payer: BC Managed Care – PPO | Admitting: Internal Medicine

## 2011-11-20 ENCOUNTER — Encounter: Payer: Self-pay | Admitting: Internal Medicine

## 2011-11-20 VITALS — BP 126/82 | HR 75 | Temp 98.2°F | Wt 205.0 lb

## 2011-11-20 DIAGNOSIS — J069 Acute upper respiratory infection, unspecified: Secondary | ICD-10-CM

## 2011-11-20 MED ORDER — AMOXICILLIN 500 MG PO CAPS: 1000.0000 mg | ORAL_CAPSULE | Freq: Two times a day (BID) | ORAL | Status: DC

## 2011-11-20 MED ORDER — AZELASTINE HCL 0.1 % NA SOLN: 2.0000 | Freq: Two times a day (BID) | NASAL | Status: DC

## 2011-11-20 NOTE — Progress Notes (Signed)
  Subjective:    Patient ID: Sydney Robinson, female    DOB: March 28, 1945, 66 y.o.   MRN: [id number]  HPI Acute visit 5-6 days history of frontal headache, nasal and sinus congestion, sneezing, itchy eyes. She is taking Advil over-the-counter, she's not taking decongestants. No history of asthma but at some point she was prescribed albuterol, currently denies wheezing.  Past Medical History  Diagnosis Date  . 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 Diverticulosis   . Hyperlipidemia   . Hypertension   . Murmur       Review of Systems 3 days ago had a low-grade fever and some chills, no further problems. Very mild cough but she does bring up yellow or green sputum. Some nasal discharge, yellow in color. No nausea, vomiting, diarrhea    Objective:   Physical Exam  General -- alert, well-developed.No apparent distress.  HEENT -- TMs normal, throat w/o redness, face symmetric and not tender to palpation, nose moderately  congested   Lungs -- normal respiratory effort, no intercostal retractions, no accessory muscle use, and normal breath sounds.   Heart-- normal rate, regular rhythm, no murmur, and no gallop.    Psych-- Cognition and judgment appear intact. Alert and cooperative with normal attention span and concentration.  not anxious appearing and not depressed appearing.       Assessment & Plan:   URI, see instructions

## 2011-11-20 NOTE — Patient Instructions (Addendum)
Rest, fluids , tylenol For cough, take Mucinex DM twice a day as needed  For congestion use astelin nasal spray twice a day until you feel better Take the antibiotic as prescribed  (Amoxicillin) only if not better in 2-3 days Call if no better in few days Call anytime if the symptoms are severe Get your flu shot once you are better

## 2011-11-21 ENCOUNTER — Encounter: Payer: Self-pay | Admitting: Internal Medicine

## 2011-12-03 ENCOUNTER — Ambulatory Visit (INDEPENDENT_AMBULATORY_CARE_PROVIDER_SITE_OTHER): Payer: BC Managed Care – PPO | Admitting: Internal Medicine

## 2011-12-03 ENCOUNTER — Encounter: Payer: Self-pay | Admitting: Internal Medicine

## 2011-12-03 VITALS — BP 102/70 | HR 69 | Temp 98.1°F | Ht 66.08 in | Wt 203.2 lb

## 2011-12-03 DIAGNOSIS — Z0000 Encounter for general adult medical examination without abnormal findings: Secondary | ICD-10-CM

## 2011-12-03 DIAGNOSIS — I10 Essential (primary) hypertension: Secondary | ICD-10-CM

## 2011-12-03 DIAGNOSIS — Z23 Encounter for immunization: Secondary | ICD-10-CM

## 2011-12-03 DIAGNOSIS — J45909 Unspecified asthma, uncomplicated: Secondary | ICD-10-CM | POA: Insufficient documentation

## 2011-12-03 DIAGNOSIS — E785 Hyperlipidemia, unspecified: Secondary | ICD-10-CM

## 2011-12-03 MED ORDER — CLONAZEPAM 0.5 MG PO TABS: ORAL_TABLET | ORAL | Status: DC

## 2011-12-03 MED ORDER — LABETALOL HCL 300 MG PO TABS: 300.0000 mg | ORAL_TABLET | Freq: Two times a day (BID) | ORAL | Status: DC

## 2011-12-03 MED ORDER — SIMVASTATIN 40 MG PO TABS: 40.0000 mg | ORAL_TABLET | Freq: Every day | ORAL | Status: DC

## 2011-12-03 NOTE — Patient Instructions (Addendum)
Exercise  30-45  minutes a day, 3-4 days a week. Walking is especially valuable in preventing Osteoporosis.The best exercises for the low back include freestyle swimming, stretch aerobics, and yoga. Eat a low-fat diet with lots of fruits and vegetables, up to 7-9 servings per day.  Consume less than 30 grams of sugar per day from foods & drinks with High Fructose Corn Syrup as #1,2,3 or #4 on label. Please  schedule fasting Labs : BMET,Lipids, hepatic panel, CBC & dif, TSH. PLEASE BRING THESE INSTRUCTIONS TO FOLLOW UP  LAB APPOINTMENT.This will guarantee correct labs are drawn, eliminating need for repeat blood sampling ( needle sticks ! ). Diagnoses /Codes: V70.0. If you activate My Chart; the results can be released to you as soon as they populate from the lab. If you choose not to use this program; the labs have to be reviewed, copied & mailed   causing a delay in getting the results to you.

## 2011-12-03 NOTE — Progress Notes (Signed)
  Subjective:    Patient ID: Sydney Robinson, female    DOB: 1945-12-09, 66 y.o.   MRN: [id number]  HPI  Sydney Robinson is here for a physical;acute issues include leg weakness .      Review of Systems  She has a history of bulging disc. She has noted some weakness in the right lower extremity in the thigh area. This improves with walking and take a nonsteroidal agent. There's been no associated numbness, tingling, or incontinence of urine or stool.     Objective:   Physical Exam Gen.: Healthy and well-nourished in appearance. Alert, appropriate and cooperative throughout exam. Head: Normocephalic without obvious abnormalities  Eyes: No corneal or conjunctival inflammation noted. Pupils equal round reactive to light and accommodation. Fundal exam is benign without hemorrhages, exudate, papilledema. Extraocular motion intact. Vision grossly normal. Ears: External  ear exam reveals no significant lesions or deformities. Canals clear .TMs normal. Hearing is grossly normal bilaterally. Nose: External nasal exam reveals no deformity or inflammation. Nasal mucosa are pink and moist. No lesions or exudates noted.   Mouth: Oral mucosa and oropharynx reveal no lesions or exudates. Teeth in good repair.Upper partial Neck: No deformities, masses, or tenderness noted. Range of motion & Thyroid normal Lungs: Normal respiratory effort; chest expands symmetrically. Lungs are clear to auscultation without rales, wheezes, or increased work of breathing. Heart: Normal rate and rhythm. Normal S1 and S2. No gallop, click, or rub. S4 with slurring at  LSB  Abdomen: Bowel sounds normal; abdomen soft and nontender. No masses, organomegaly or hernias noted. Genitalia: Dr Chevis Pretty, Gyn Musculoskeletal/extremities: No deformity or scoliosis noted of  the thoracic or lumbar spine. No clubbing, cyanosis, edema, or deformity noted. Range of motion  normal .Tone & strength  normal.Joints normal. Nail health  good. Vascular:  Carotid, radial artery, dorsalis pedis and  posterior tibial pulses are full and equal. No bruits present. Neurologic: Alert and oriented x3. Deep tendon reflexes symmetrical and normal.          Skin: Intact without suspicious lesions or rashes. Lymph: No cervical, axillary lymphadenopathy present. Psych: Mood and affect are normal. Normally interactive                                                                                       Assessment & Plan:  #1 comprehensive physical exam; no acute findings #2 L-1 radiculopthy Plan: see Orders

## 2011-12-11 ENCOUNTER — Other Ambulatory Visit (INDEPENDENT_AMBULATORY_CARE_PROVIDER_SITE_OTHER): Payer: BC Managed Care – PPO

## 2011-12-11 DIAGNOSIS — Z0000 Encounter for general adult medical examination without abnormal findings: Secondary | ICD-10-CM

## 2011-12-11 LAB — HEPATIC FUNCTION PANEL
ALT: 12 U/L (ref 0–35)
AST: 16 U/L (ref 0–37)
Bilirubin, Direct: 0.1 mg/dL (ref 0.0–0.3)
Total Bilirubin: 0.9 mg/dL (ref 0.3–1.2)

## 2011-12-11 LAB — CBC WITH DIFFERENTIAL/PLATELET
Basophils Relative: 0.5 % (ref 0.0–3.0)
Eosinophils Absolute: 0.1 10*3/uL (ref 0.0–0.7)
Lymphs Abs: 1.8 10*3/uL (ref 0.7–4.0)
MCHC: 33.7 g/dL (ref 30.0–36.0)
MCV: 87.7 fl (ref 78.0–100.0)
Monocytes Absolute: 0.3 10*3/uL (ref 0.1–1.0)
Neutrophils Relative %: 48.7 % (ref 43.0–77.0)
RBC: 4.4 Mil/uL (ref 3.87–5.11)

## 2011-12-11 LAB — BASIC METABOLIC PANEL
CO2: 28 mEq/L (ref 19–32)
Calcium: 8.9 mg/dL (ref 8.4–10.5)
Creatinine, Ser: 0.9 mg/dL (ref 0.4–1.2)
Glucose, Bld: 101 mg/dL — ABNORMAL HIGH (ref 70–99)

## 2011-12-11 LAB — LDL CHOLESTEROL, DIRECT: Direct LDL: 140 mg/dL

## 2011-12-17 ENCOUNTER — Ambulatory Visit: Payer: BC Managed Care – PPO

## 2011-12-17 DIAGNOSIS — R7309 Other abnormal glucose: Secondary | ICD-10-CM

## 2012-06-16 ENCOUNTER — Other Ambulatory Visit: Payer: Self-pay

## 2012-06-16 DIAGNOSIS — Z1231 Encounter for screening mammogram for malignant neoplasm of breast: Secondary | ICD-10-CM

## 2012-06-23 ENCOUNTER — Ambulatory Visit
Admission: RE | Admit: 2012-06-23 | Discharge: 2012-06-23 | Disposition: A | Discharge status: Home / Self Care | Payer: BC Managed Care – PPO | Source: Ambulatory Visit

## 2012-06-23 DIAGNOSIS — Z1231 Encounter for screening mammogram for malignant neoplasm of breast: Secondary | ICD-10-CM | POA: Diagnosis not present

## 2012-10-14 DIAGNOSIS — Z23 Encounter for immunization: Secondary | ICD-10-CM | POA: Diagnosis not present

## 2012-11-04 ENCOUNTER — Ambulatory Visit (INDEPENDENT_AMBULATORY_CARE_PROVIDER_SITE_OTHER): Payer: Medicare Other | Admitting: Internal Medicine

## 2012-11-04 ENCOUNTER — Other Ambulatory Visit: Payer: Self-pay | Admitting: Internal Medicine

## 2012-11-04 VITALS — BP 121/78 | HR 71 | Temp 98.8°F | Wt 208.8 lb

## 2012-11-04 DIAGNOSIS — B369 Superficial mycosis, unspecified: Secondary | ICD-10-CM

## 2012-11-04 DIAGNOSIS — Z833 Family history of diabetes mellitus: Secondary | ICD-10-CM | POA: Diagnosis not present

## 2012-11-04 DIAGNOSIS — R7301 Impaired fasting glucose: Secondary | ICD-10-CM

## 2012-11-04 DIAGNOSIS — R7303 Prediabetes: Secondary | ICD-10-CM | POA: Insufficient documentation

## 2012-11-04 MED ORDER — KETOCONAZOLE 2 % EX CREA: TOPICAL_CREAM | CUTANEOUS | Status: DC

## 2012-11-04 NOTE — Progress Notes (Signed)
  Subjective:    Patient ID: Sydney Robinson, female    DOB: 02-13-1945, 67 y.o.   MRN: [id number]  HPI  She's had a pruritis in the left axilla for over a year. In the last 2 weeks she's had a pruritis rash in the right axilla. This was not related to any new cosmetics or deodorants.  She denied any associated rash in the inguinal areas or in the inframammary areas.  She's had some chills which she attributed to recent upper respiratory tract infection. She did have a fungal infection with vaginal itching one month ago after antibiotics.  She is used cortisone cream, baking soda, & Neosporin with variable response.    Review of Systems  She denies any other rash or skin lesions.  She has not had fever or sweats. She also denies frontal headache, facial pain, or significant nasal purulence at this time  She has no polyuria, polydipsia, or polyphagia. Her last A1c was 5.7% in December 2013.     Objective:   Physical Exam General appearance:good health ;well nourished; no acute distress or increased work of breathing is present.  No  lymphadenopathy about the head, neck, or axilla noted.   Eyes: No conjunctival inflammation or lid edema is present.   Ears:  External ear exam shows no significant lesions or deformities.  Otoscopic examination reveals clear canals, tympanic membranes are intact bilaterally without bulging, retraction, inflammation or discharge.  Nose:  External nasal examination shows no deformity or inflammation. Nasal mucosa are pink and moist without lesions or exudates. No septal dislocation or deviation.No obstruction to airflow.   Oral exam: Dental hygiene is good; lips and gums are healthy appearing.There is no oropharyngeal erythema or exudate noted.   Neck:  No deformities,  masses, or tenderness noted.     Heart:  Normal rate and regular rhythm. S1 and S2 normal without gallop, murmur, click, rub or other extra sounds.   Lungs:Chest clear to auscultation;  no wheezes, rhonchi,rales ,or rubs present.No increased work of breathing.    Extremities:  No cyanosis, edema, or clubbing  noted   No organomegaly or masses present  Skin: 4.5 x 5 cm raised violaceous rash in the right axilla. No dermatitis in the left axilla. Well-healed operative scar on the left        Assessment & Plan:  #1 dermatitis right axilla rule out active fungal infection. Neosporin could cause a contact dermatitis as well.  Plan see orders

## 2012-11-04 NOTE — Patient Instructions (Signed)
Your next office appointment will be determined based upon  your medication response.  Please report any significant change in your symptoms.

## 2012-11-05 ENCOUNTER — Telehealth: Payer: Self-pay | Admitting: *Deleted

## 2012-11-05 ENCOUNTER — Other Ambulatory Visit: Payer: Self-pay | Admitting: *Deleted

## 2012-11-05 DIAGNOSIS — Z79899 Other long term (current) drug therapy: Secondary | ICD-10-CM | POA: Diagnosis not present

## 2012-11-05 MED ORDER — CLONAZEPAM 0.5 MG PO TABS: ORAL_TABLET | ORAL | Status: DC

## 2012-11-05 NOTE — Telephone Encounter (Signed)
clonazePAM (KLONOPIN) 0.5 MG tablet Last refill: 12/03/2011 #30, 5 refills Last OV: 11/04/2012 No contract on file

## 2012-11-05 NOTE — Telephone Encounter (Signed)
Clonazepam refilled. Controlled substance contract printed for patient to sign. Pt notified.

## 2012-11-05 NOTE — Telephone Encounter (Signed)
OK # [phone] , R X [phone]

## 2012-11-13 ENCOUNTER — Other Ambulatory Visit: Payer: Self-pay

## 2012-11-25 ENCOUNTER — Other Ambulatory Visit: Payer: Self-pay | Admitting: Internal Medicine

## 2012-11-27 NOTE — Telephone Encounter (Signed)
Labetalol refilled

## 2012-12-19 ENCOUNTER — Telehealth: Payer: Self-pay

## 2012-12-19 NOTE — Telephone Encounter (Signed)
Medication and allergies:  Reviewed and updated  90 day supply/mail order: na Local pharmacy: [facility] [address]   Immunizations due:  Shingles, PNA  A/P:   No changes to FH or PSH [facility]--06/2012--benign findings Bone Density--none [facility]--04/2011--Dr Medoff--next 2018--polyp hx Flu vaccine--10/14/2012  To Discuss with Provider: Shingles and PNA vaccines Possible Derm referral Husband is in hospital

## 2012-12-22 ENCOUNTER — Encounter: Payer: Self-pay | Admitting: Internal Medicine

## 2012-12-22 ENCOUNTER — Ambulatory Visit (INDEPENDENT_AMBULATORY_CARE_PROVIDER_SITE_OTHER): Payer: Medicare Other | Admitting: Internal Medicine

## 2012-12-22 VITALS — BP 121/75 | HR 80 | Temp 98.8°F | Ht 66.75 in | Wt 211.6 lb

## 2012-12-22 DIAGNOSIS — Z124 Encounter for screening for malignant neoplasm of cervix: Secondary | ICD-10-CM | POA: Diagnosis not present

## 2012-12-22 DIAGNOSIS — E785 Hyperlipidemia, unspecified: Secondary | ICD-10-CM

## 2012-12-22 DIAGNOSIS — R079 Chest pain, unspecified: Secondary | ICD-10-CM

## 2012-12-22 DIAGNOSIS — I10 Essential (primary) hypertension: Secondary | ICD-10-CM

## 2012-12-22 DIAGNOSIS — Z86010 Personal history of colonic polyps: Secondary | ICD-10-CM | POA: Diagnosis not present

## 2012-12-22 DIAGNOSIS — Z01419 Encounter for gynecological examination (general) (routine) without abnormal findings: Secondary | ICD-10-CM | POA: Diagnosis not present

## 2012-12-22 DIAGNOSIS — Z0000 Encounter for general adult medical examination without abnormal findings: Secondary | ICD-10-CM | POA: Diagnosis not present

## 2012-12-22 NOTE — Patient Instructions (Signed)
Order for labs entered into  the computer; these will be performed at [address] [facility]. across from [facility]. No appointment is necessary.

## 2012-12-22 NOTE — Progress Notes (Signed)
Subjective:    Patient ID: Sydney Robinson, female    DOB: Oct 14, 1945, 67 y.o.   MRN: [id number]  HPI  Medicare Wellness Visit: Psychosocial and medical history were reviewed as required by Medicare (history related to abuse, antisocial behavior , firearm risk). Social history: Caffeine:2 cups/ day  , Alcohol: rarely , Tobacco ZHY:QMVHQ Exercise:see below Personal safety/fall risk:no Limitations of activities of daily living:no Seatbelt/ smoke alarm use:yes Healthcare Power of Attorney/Living Will status: ? POA  needed Ophthalmologic exam status:current Hearing evaluation status:not current Orientation: Oriented X 3 Memory and recall: good Spelling or math testing: good Depression/anxiety assessment: in context of husband's issues (BM transplant) Foreign travel history:2008 [place] Immunization status for influenza/pneumonia/ shingles /tetanus:Shingles & PNA needed Transfusion history:no Preventive health care maintenance status: Colonoscopy/BMD/mammogram/Pap as per protocol/standard care:current Dental care:every 6 mos Chart reviewed and updated. Active issues reviewed and addressed as documented below.    Review of Systems A heart healthy diet is not followed;no  exercise program . Family history is + for premature coronary disease in M aunt MI < 109.. No advanced cholesterol testing to date. There is medication non compliance with the statin; it is taken 3-4 X/ week  Low dose ASA not taken She has had intermittent chest pain in last week in context of stress w/o palpitations. Some DOE due to inactivity.No claudication.  Significant abdominal symptoms, memory deficit, or myalgias not present. BP @ home 110/70s- 130/80.     Objective:   Physical Exam Gen.: Healthy and well-nourished in appearance. Alert, appropriate and cooperative throughout exam. Appears younger than stated age  Head: Normocephalic without obvious abnormalities Eyes: No corneal or conjunctival  inflammation noted. Pupils equal round reactive to light and accommodation. Extraocular motion intact.  Ears: External  ear exam reveals no significant lesions or deformities. Canals clear .TMs normal. Hearing is grossly normal bilaterally. Nose: External nasal exam reveals no deformity or inflammation. Nasal mucosa are pink and moist. No lesions or exudates noted.   Mouth: Oral mucosa and oropharynx reveal no lesions or exudates. Teeth in good repair. Neck: No deformities, masses, or tenderness noted. Range of motion & Thyroid normal. Lungs: Normal respiratory effort; chest expands symmetrically. Lungs are clear to auscultation without rales, wheezes, or increased work of breathing. Heart: Normal rate and rhythm. Normal S1 and S2. No gallop, click, or rub.  S4 with slurring at  LSB; no definite  murmur. Abdomen: Bowel sounds normal; abdomen soft and nontender. No masses, organomegaly or hernias noted. Genitalia: as per Gyn                                  Musculoskeletal/extremities:    Accentuated curvature of upper thoracic spine. No clubbing, cyanosis, edema, or significant extremity  deformity noted. Range of motion normal .Tone & strength normal. Crepitus knees Hand joints normal . Fingernail  health good. Able to lie down & sit up w/o help. Negative SLR bilaterally Vascular: Carotid, radial artery, dorsalis pedis and  posterior tibial pulses are full and equal. No bruits present. Neurologic: Alert and oriented x3. Deep tendon reflexes symmetrical and normal.     Skin: Intact without suspicious lesions or rashes. Lymph: No cervical, axillary lymphadenopathy present. Psych: Mood and affect are normal. Normally interactive  Assessment & Plan:  #1 Medicare Wellness Exam; criteria met ; data entered #2 Problem List/Diagnoses reviewed Plan:  Assessments made/ Orders entered  

## 2012-12-22 NOTE — Progress Notes (Signed)
Pre visit review using our clinic review tool, if applicable. No additional management support is needed unless otherwise documented below in the visit note. 

## 2012-12-29 ENCOUNTER — Other Ambulatory Visit (INDEPENDENT_AMBULATORY_CARE_PROVIDER_SITE_OTHER): Payer: Medicare Other

## 2012-12-29 DIAGNOSIS — I10 Essential (primary) hypertension: Secondary | ICD-10-CM | POA: Diagnosis not present

## 2012-12-29 DIAGNOSIS — Z860100 Personal history of colon polyps, unspecified: Secondary | ICD-10-CM

## 2012-12-29 DIAGNOSIS — E785 Hyperlipidemia, unspecified: Secondary | ICD-10-CM | POA: Diagnosis not present

## 2012-12-29 LAB — CBC WITH DIFFERENTIAL/PLATELET
Basophils Absolute: 0 10*3/uL (ref 0.0–0.1)
Eosinophils Absolute: 0.1 10*3/uL (ref 0.0–0.7)
HCT: 38.7 % (ref 36.0–46.0)
Lymphs Abs: 2 10*3/uL (ref 0.7–4.0)
MCHC: 34.1 g/dL (ref 30.0–36.0)
MCV: 85.8 fl (ref 78.0–100.0)
Monocytes Absolute: 0.4 10*3/uL (ref 0.1–1.0)
Neutro Abs: 2.2 10*3/uL (ref 1.4–7.7)
Platelets: 203 10*3/uL (ref 150.0–400.0)
RDW: 13.7 % (ref 11.5–14.6)

## 2012-12-29 LAB — BASIC METABOLIC PANEL
BUN: 12 mg/dL (ref 6–23)
CO2: 27 mEq/L (ref 19–32)
Chloride: 108 mEq/L (ref 96–112)
GFR: 80.57 mL/min (ref >60.00)
Glucose, Bld: 97 mg/dL (ref 70–99)
Potassium: 3.8 mEq/L (ref 3.5–5.1)

## 2012-12-29 LAB — LIPID PANEL
Cholesterol: 209 mg/dL — ABNORMAL HIGH (ref 0–200)
Total CHOL/HDL Ratio: 3
Triglycerides: 89 mg/dL (ref 0.0–149.0)
VLDL: 17.8 mg/dL (ref 0.0–40.0)

## 2012-12-29 LAB — HEPATIC FUNCTION PANEL
Albumin: 4.3 g/dL (ref 3.5–5.2)
Total Bilirubin: 1.1 mg/dL (ref 0.3–1.2)

## 2013-01-20 ENCOUNTER — Encounter: Payer: Self-pay | Admitting: Internal Medicine

## 2013-02-12 ENCOUNTER — Other Ambulatory Visit: Payer: Self-pay | Admitting: Internal Medicine

## 2013-02-12 NOTE — Telephone Encounter (Signed)
Med filled.  

## 2013-05-11 ENCOUNTER — Encounter: Payer: Self-pay | Admitting: Internal Medicine

## 2013-05-11 ENCOUNTER — Ambulatory Visit (INDEPENDENT_AMBULATORY_CARE_PROVIDER_SITE_OTHER): Payer: Medicare Other | Admitting: Internal Medicine

## 2013-05-11 VITALS — BP 118/80 | HR 87 | Temp 98.3°F | Resp 14 | Wt 210.2 lb

## 2013-05-11 DIAGNOSIS — R000 Tachycardia, unspecified: Secondary | ICD-10-CM

## 2013-05-11 NOTE — Progress Notes (Signed)
Pre visit review using our clinic review tool, if applicable. No additional management support is needed unless otherwise documented below in the visit note. 

## 2013-05-11 NOTE — Patient Instructions (Signed)
Please do not exercise on machines which have documented exertional  tachycardia until the stress test has been completed. Any exercise such as walking which is not associated with tachycardia could be conducted.

## 2013-05-11 NOTE — Progress Notes (Signed)
   Subjective:    Patient ID: Sydney Robinson, female    DOB: 1945-04-04, 68 y.o.   MRN: [id number]  HPI   She's been exercising since January of this year as [facility] She'll exercise 3 times a week for 60-90 minutes.  Although asymptomatic the machine will measure her heart rate up to 216. Her sister uses the same machine but no such phenomenon occurs.  Extensive labs 12/29/12 were normal    Review of Systems  She denies associated chest pain, dyspnea, palpitations, nausea, or diaphoresis.      Objective:   Physical Exam Appears healthy and well-nourished & in no acute distress. Some weight excess  No carotid bruits are present.No neck pain distention present at 10 - 15 degrees. Thyroid normal to palpation  Heart rhythm and rate are normal with no gallop or murmur  Chest is clear with no increased work of breathing  There is no evidence of aortic aneurysm or renal artery bruits  Abdomen soft with no organomegaly or masses. No HJR  No clubbing, cyanosis  Trace edema present.  Pedal pulses are intact   No ischemic skin changes are present . Fingernails/ toenails healthy   Alert and oriented. Strength, tone, DTRs reflexes normal         Assessment & Plan:  #1 exertional tachycardia. EKG is normal with no accessory pathway. See orders

## 2013-05-22 ENCOUNTER — Ambulatory Visit (INDEPENDENT_AMBULATORY_CARE_PROVIDER_SITE_OTHER): Payer: Medicare Other | Admitting: Internal Medicine

## 2013-05-22 ENCOUNTER — Encounter: Payer: Self-pay | Admitting: Internal Medicine

## 2013-05-22 VITALS — BP 110/72 | HR 79 | Temp 98.3°F | Ht 67.0 in | Wt 209.2 lb

## 2013-05-22 DIAGNOSIS — M545 Low back pain, unspecified: Secondary | ICD-10-CM | POA: Diagnosis not present

## 2013-05-22 MED ORDER — CYCLOBENZAPRINE HCL 5 MG PO TABS: ORAL_TABLET | ORAL | Status: DC

## 2013-05-22 MED ORDER — TRAMADOL HCL 50 MG PO TABS: 50.0000 mg | ORAL_TABLET | Freq: Four times a day (QID) | ORAL | Status: DC | PRN

## 2013-05-22 NOTE — Progress Notes (Signed)
   Subjective:    Patient ID: Sydney Robinson, female    DOB: [birth year], 68 y.o.   MRN: [id number]  HPI   Her symptoms started 05/14/13 less than 24 hours after working in yard & painting over several hours on 5/6. The pain is described at the right lumbosacral/hip area as sharp up to level IX and positional. It does improve when she stands up. It is worse with thorax movement or waist flexion.  Nonsteroidals, massage, bio freeze, and heat had been of partial benefit  temporarily.  She has associated myalgias    Review of Systems  She denies fever, chills, sweats, or unexplained weight loss  She's had no abdominal pain, trauma, rectal bleeding, or change in stools  She also denies dysuria, pyuria, or hematuria.  There is no radiation of the pain into the legs. She's had no associated limb weakness, numbness, or tingling.  There's been no incontinence of urine and stool  There has been no rash or change in color/temperature of skin in the area of the pain      Objective:   Physical Exam  General appearance is one of good health and nourishment w/o distress.  Eyes: No conjunctival inflammation or scleral icterus is present. Heart:  Normal rate and regular rhythm. S1 and S2 normal without gallop, murmur, click, rub or other extra sounds     Lungs:Chest clear to auscultation; no wheezes, rhonchi,rales ,or rubs present.No increased work of breathing.   Abdomen: bowel sounds normal, soft and non-tender without masses, organomegaly or hernias noted.  No guarding or rebound . No tenderness over the flanks to percussion  Musculoskeletal: Able to lie flat and sit up without help. Negative straight leg raising bilaterally. Gait normal  Skin:Warm & dry.  Intact without suspicious lesions or rashes ; no jaundice or tenting  Lymphatic: No lymphadenopathy is noted about the head, neck, axilla.             Assessment & Plan:  #1 acute low back pain, musculoskeletal without  neurologic deficit  See orders and after visit summary.  Physical therapy referral if symptoms persist or progress

## 2013-05-22 NOTE — Progress Notes (Signed)
Pre visit review using our clinic review tool, if applicable. No additional management support is needed unless otherwise documented below in the visit note. 

## 2013-05-22 NOTE — Patient Instructions (Signed)
The best exercises for the low back include freestyle swimming, stretch aerobics, and yoga.Cybex & Nautilus machines with < 25 #  rather than dead weights are better for the back.

## 2013-06-15 ENCOUNTER — Other Ambulatory Visit: Payer: Self-pay

## 2013-06-15 DIAGNOSIS — Z1231 Encounter for screening mammogram for malignant neoplasm of breast: Secondary | ICD-10-CM

## 2013-06-25 ENCOUNTER — Ambulatory Visit
Admission: RE | Admit: 2013-06-25 | Discharge: 2013-06-25 | Disposition: A | Discharge status: Home / Self Care | Payer: Medicare Other | Source: Ambulatory Visit

## 2013-06-25 DIAGNOSIS — Z1231 Encounter for screening mammogram for malignant neoplasm of breast: Secondary | ICD-10-CM | POA: Diagnosis not present

## 2013-06-26 ENCOUNTER — Telehealth: Payer: Self-pay

## 2013-06-26 ENCOUNTER — Other Ambulatory Visit: Payer: Self-pay | Admitting: Internal Medicine

## 2013-06-26 DIAGNOSIS — R000 Tachycardia, unspecified: Secondary | ICD-10-CM

## 2013-06-26 NOTE — Telephone Encounter (Signed)
Phone call from patient stating a stress test was to be ordered for her. Please advise.

## 2013-06-26 NOTE — Telephone Encounter (Signed)
I entered order but EMR states already ordered 05/11/13; please F/U

## 2013-09-11 ENCOUNTER — Encounter: Payer: Self-pay | Admitting: Internal Medicine

## 2013-09-11 ENCOUNTER — Ambulatory Visit (INDEPENDENT_AMBULATORY_CARE_PROVIDER_SITE_OTHER): Payer: Medicare Other | Admitting: Internal Medicine

## 2013-09-11 VITALS — BP 140/100 | HR 82 | Temp 98.2°F | Wt 209.2 lb

## 2013-09-11 DIAGNOSIS — J309 Allergic rhinitis, unspecified: Secondary | ICD-10-CM | POA: Diagnosis not present

## 2013-09-11 DIAGNOSIS — H1010 Acute atopic conjunctivitis, unspecified eye: Secondary | ICD-10-CM

## 2013-09-11 DIAGNOSIS — R42 Dizziness and giddiness: Secondary | ICD-10-CM

## 2013-09-11 DIAGNOSIS — I10 Essential (primary) hypertension: Secondary | ICD-10-CM | POA: Diagnosis not present

## 2013-09-11 MED ORDER — HYDROCHLOROTHIAZIDE 12.5 MG PO CAPS: 12.5000 mg | ORAL_CAPSULE | Freq: Every day | ORAL | Status: DC

## 2013-09-11 NOTE — Progress Notes (Signed)
   Subjective:    Patient ID: Sydney Robinson, female    DOB: 17-Sep-1945, 68 y.o.   MRN: [id number]  HPI   She has had sensation of near syncope intermittently over the last two months. Specifically this occurs after she's been up and walked approximately 15 steps. It is not related to  postural change such as standing up & not related to turning over in bed Symptoms of near syncope will last several seconds. She does describe symptoms of extrinsic rhinoconjunctivitis which she's taken Zyrtec. She denies taking Zyrtec D No symptoms of rhinosinusitis.  She has no history of asthma and does not smoke.     Review of Systems   Denied were any change in heart rhythm or rate prior to the events. There was no associated chest pain or shortness of breath .  Chest pain, palpitations, tachycardia,  paroxysmal nocturnal dyspnea, claudication or edema are absent.   Increased humidity has been a trigger for some exertional dyspnea. She has no other cardiovascular symptoms .She has no dyspnea while active in air-conditioned environments.  She has no hearing loss, tinnitus, blurred vision, double vision,or loss of vision. She has occasional mild headaches over the crown. She denies any numbness, tingling, or weakness in the extremities.  Frontal headache, facial pain , nasal purulence, dental pain, sore throat , otic pain or otic discharge denied. No fever , chills or sweats.      Objective:   Physical Exam  Positive or pertinent findings include: Upper partial. The first heart sound is slightly accentuated . Abdomen slightly protuberant. She has crepitus of her knees.  General appearance :adequately nourished; in no distress. Eyes: No conjunctival inflammation or scleral icterus is present. Oral exam: Lips and gums are healthy appearing.There is no oropharyngeal erythema or exudate noted.  Heart:  Normal rate and regular rhythm. S2 normal without gallop, murmur, click, rub or other extra  sounds   Lungs:Chest clear to auscultation; no wheezes, rhonchi,rales ,or rubs present.No increased work of breathing.  Abdomen: bowel sounds normal, soft and non-tender without masses, organomegaly or hernias noted.  No guarding or rebound.  Skin:Warm & dry.  Intact without suspicious lesions or rashes ; no jaundice or tenting Lymphatic: No lymphadenopathy is noted about the head, neck, axilla Neurologic exam :Cn 2-7 intact Strength equal & normal in upper & lower extremities Able to walk on heels and toes.   Balance normal  Romberg normal, finger to nose normal           Assessment & Plan:  #1 dizziness in context of #2 #2extrinsic rhinoconjunctivitis #3 HTN See orders

## 2013-09-11 NOTE — Patient Instructions (Signed)

## 2013-09-11 NOTE — Progress Notes (Signed)
Pre visit review using our clinic review tool, if applicable. No additional management support is needed unless otherwise documented below in the visit note. 

## 2013-10-13 DIAGNOSIS — Z23 Encounter for immunization: Secondary | ICD-10-CM | POA: Diagnosis not present

## 2013-11-23 ENCOUNTER — Other Ambulatory Visit: Payer: Self-pay

## 2013-11-23 DIAGNOSIS — I10 Essential (primary) hypertension: Secondary | ICD-10-CM

## 2013-11-23 MED ORDER — HYDROCHLOROTHIAZIDE 12.5 MG PO CAPS: 12.5000 mg | ORAL_CAPSULE | Freq: Every day | ORAL | Status: DC

## 2013-12-07 ENCOUNTER — Other Ambulatory Visit (INDEPENDENT_AMBULATORY_CARE_PROVIDER_SITE_OTHER): Payer: Medicare Other

## 2013-12-07 ENCOUNTER — Other Ambulatory Visit: Payer: Self-pay | Admitting: Internal Medicine

## 2013-12-07 ENCOUNTER — Encounter: Payer: Self-pay | Admitting: Internal Medicine

## 2013-12-07 ENCOUNTER — Ambulatory Visit (INDEPENDENT_AMBULATORY_CARE_PROVIDER_SITE_OTHER): Payer: Medicare Other | Admitting: Internal Medicine

## 2013-12-07 VITALS — BP 122/80 | HR 70 | Temp 97.9°F | Resp 13 | Ht 67.0 in | Wt 210.0 lb

## 2013-12-07 DIAGNOSIS — R42 Dizziness and giddiness: Secondary | ICD-10-CM | POA: Diagnosis not present

## 2013-12-07 DIAGNOSIS — E785 Hyperlipidemia, unspecified: Secondary | ICD-10-CM | POA: Diagnosis not present

## 2013-12-07 DIAGNOSIS — R0600 Dyspnea, unspecified: Secondary | ICD-10-CM

## 2013-12-07 DIAGNOSIS — R000 Tachycardia, unspecified: Secondary | ICD-10-CM | POA: Diagnosis not present

## 2013-12-07 DIAGNOSIS — Z86010 Personal history of colonic polyps: Secondary | ICD-10-CM | POA: Diagnosis not present

## 2013-12-07 DIAGNOSIS — R7301 Impaired fasting glucose: Secondary | ICD-10-CM

## 2013-12-07 DIAGNOSIS — I10 Essential (primary) hypertension: Secondary | ICD-10-CM

## 2013-12-07 DIAGNOSIS — Z0000 Encounter for general adult medical examination without abnormal findings: Secondary | ICD-10-CM | POA: Diagnosis not present

## 2013-12-07 LAB — CBC WITH DIFFERENTIAL/PLATELET
BASOS ABS: 0 10*3/uL (ref 0.0–0.1)
Basophils Relative: 0.5 % (ref 0.0–3.0)
Eosinophils Absolute: 0.1 10*3/uL (ref 0.0–0.7)
Eosinophils Relative: 1.5 % (ref 0.0–5.0)
HCT: 42.3 % (ref 36.0–46.0)
Hemoglobin: 14.2 g/dL (ref 12.0–15.0)
LYMPHS ABS: 2 10*3/uL (ref 0.7–4.0)
Lymphocytes Relative: 38.3 % (ref 12.0–46.0)
MCHC: 33.5 g/dL (ref 30.0–36.0)
MCV: 86.2 fl (ref 78.0–100.0)
Monocytes Absolute: 0.4 10*3/uL (ref 0.1–1.0)
Monocytes Relative: 7.3 % (ref 3.0–12.0)
NEUTROS PCT: 52.4 % (ref 43.0–77.0)
Neutro Abs: 2.7 10*3/uL (ref 1.4–7.7)
Platelets: 246 10*3/uL (ref 150.0–400.0)
RBC: 4.91 Mil/uL (ref 3.87–5.11)
RDW: 13.6 % (ref 11.5–15.5)
WBC: 5.2 10*3/uL (ref 4.0–10.5)

## 2013-12-07 LAB — HEPATIC FUNCTION PANEL
ALT: 14 U/L (ref 0–35)
AST: 20 U/L (ref 0–37)
Albumin: 4.6 g/dL (ref 3.5–5.2)
Alkaline Phosphatase: 64 U/L (ref 39–117)
BILIRUBIN DIRECT: 0.1 mg/dL (ref 0.0–0.3)
BILIRUBIN TOTAL: 1.1 mg/dL (ref 0.2–1.2)
Total Protein: 7.4 g/dL (ref 6.0–8.3)

## 2013-12-07 LAB — BASIC METABOLIC PANEL
BUN: 15 mg/dL (ref 6–23)
CALCIUM: 9.4 mg/dL (ref 8.4–10.5)
CO2: 27 mEq/L (ref 19–32)
Chloride: 101 mEq/L (ref 96–112)
Creatinine, Ser: 1 mg/dL (ref 0.4–1.2)
GFR: 68 mL/min (ref >60.00)
GLUCOSE: 91 mg/dL (ref 70–99)
Potassium: 3.8 mEq/L (ref 3.5–5.1)
Sodium: 137 mEq/L (ref 135–145)

## 2013-12-07 LAB — TSH: TSH: 1.52 u[IU]/mL (ref 0.35–4.50)

## 2013-12-07 MED ORDER — CLONAZEPAM 0.5 MG PO TABS: ORAL_TABLET | ORAL | Status: DC

## 2013-12-07 NOTE — Progress Notes (Signed)
Pre visit review using our clinic review tool, if applicable. No additional management support is needed unless otherwise documented below in the visit note. 

## 2013-12-07 NOTE — Assessment & Plan Note (Signed)
Blood pressure goals reviewed. BMET 

## 2013-12-07 NOTE — Progress Notes (Signed)
Subjective:    Patient ID: Sydney Robinson, female    DOB: 01/06/1946, 68 y.o.   MRN: [id number]  HPI UHC/Medicare Wellness Visit: Psychosocial and medical history were reviewed as required by Medicare (history related to abuse, antisocial behavior , firearm risk). Social history: Caffeine: 2 twelve oz cups / day Alcohol: rare wine Tobacco WUJ:[id number] Exercise:see below Personal safety/fall risk:no Limitations of activities of daily living:no Seatbelt/ smoke alarm use:yes Healthcare Power of Attorney/Living Will status and End of Life process assessment : UTD Ophthalmologic exam status:UTD Hearing evaluation status:not UTD Orientation: Oriented X 3 Memory and recall: good Spelling testing: good Depression/anxiety assessment: no Foreign travel history: 2008 [place] Immunization status for influenza/pneumonia/ shingles /tetanus:Shingles & PNA needed Transfusion history:no Preventive health care maintenance status: Colonoscopy/BMD/mammogram/Pap as per protocol/standard care: UTD Dental care:every 6 mos Chart reviewed and updated. Active issues reviewed and addressed as documented below.   She exercises on average once a week. She does have some exertional dyspnea when she rushes. She's not noted the exertional dyspnea when she does exercise per se. Stress test had been ordered in 5/15 to assess exertional tachycardia; but she was never called.  She has been compliant with her medicines; some lightheadedness in context of BP 90/72. BP averages 116/78.  She is on a heart healthy diet for the most part.    Review of Systems  Significant headaches, epistaxis, chest pain, palpitations, claudication, paroxysmal nocturnal dyspnea, or edema absent. No GI symptoms , memory loss or myalgias       Objective:   Physical Exam Gen.: Healthy and well-nourished in appearance. Alert, appropriate and cooperative throughout exam. Appears younger than stated age  Head: Normocephalic without  obvious abnormalities  Eyes: No corneal or conjunctival inflammation noted. Pupils equal round reactive to light and accommodation. Extraocular motion intact.  Ears: External  ear exam reveals no significant lesions or deformities. Canals clear .TMs normal. Hearing is grossly normal bilaterally. Nose: External nasal exam reveals no deformity or inflammation. Nasal mucosa are pink and moist. No lesions or exudates noted.   Mouth: Oral mucosa and oropharynx reveal no lesions or exudates. Teeth in good repair.Upper partial  Neck: No deformities, masses, or tenderness noted. Range of motion & Thyroid normal. Lungs: Normal respiratory effort; chest expands symmetrically. Lungs are clear to auscultation without rales, wheezes, or increased work of breathing. Heart: Normal rate and rhythm. Normal S1 and S2. No gallop, click, or rub. No murmur. Abdomen: Bowel sounds normal; abdomen soft and nontender. No masses, organomegaly or hernias noted. Genitalia: as per Gyn                                  Musculoskeletal/extremities: No deformity or scoliosis noted of  the thoracic or lumbar spine.  No clubbing, cyanosis, edema, or significant extremity  deformity noted.  Range of motion normal . Tone & strength normal. Hand joints normal  Fingernail / toenail health good. Crepitus of  Knees, L > R Pes planus  Able to lie down & sit up w/o help.  Negative SLR bilaterally Vascular: Carotid, radial artery, dorsalis pedis and  posterior tibial pulses are full and equal. Decreased DPP.No bruits present. Neurologic: Alert and oriented x3. Deep tendon reflexes symmetrical and normal.  Gait normal    Skin: Intact without suspicious lesions or rashes. Lymph: No cervical, axillary lymphadenopathy present. Psych: Mood and affect are normal. Normally interactive  Assessment & Plan:  See Current Assessment & Plan in Problem  List under specific DiagnosisThe labs will be reviewed and risks and options assessed. Written recommendations will be provided by mail or directly through My Chart.Further evaluation or change in medical therapy will be directed by those results. #2 dyspnea #3 exertional tachycardia

## 2013-12-07 NOTE — Assessment & Plan Note (Signed)
NMR Lipids, LFTs, TSH  

## 2013-12-07 NOTE — Patient Instructions (Addendum)
The stress test will be scheduled and you'll be notified of the time.Please call the Referral Co-Ordinator @ [phone] if you have not been notified of appointment time within 7-10 days.  Minimal Blood Pressure Goal= AVERAGE < 140/90;  Ideal is an AVERAGE < 135/85. This AVERAGE should be calculated from @ least 5-7 BP readings taken @ different times of day on different days of week. You should not respond to isolated BP readings , but rather the AVERAGE for that week .Please bring your  blood pressure cuff to office visits to verify that it is reliable.It  can also be checked against the blood pressure device at the pharmacy. Finger or wrist cuffs are not dependable; an arm cuff is.  Your next office appointment will be determined based upon review of your pending labs . Those instructions will be transmitted to you through My Chart

## 2013-12-07 NOTE — Assessment & Plan Note (Signed)
A1c

## 2013-12-09 LAB — NMR LIPOPROFILE WITH LIPIDS
Cholesterol, Total: 264 mg/dL — ABNORMAL HIGH (ref 100–199)
HDL PARTICLE NUMBER: 50.4 umol/L (ref >=30.5)
HDL Size: 9.1 nm — ABNORMAL LOW (ref >=9.2)
HDL-C: 74 mg/dL (ref >39)
LARGE HDL: 8.1 umol/L (ref >=4.8)
LDL (calc): 155 mg/dL — ABNORMAL HIGH (ref 0–99)
LDL Particle Number: 1998 nmol/L — ABNORMAL HIGH (ref <1000)
LDL SIZE: 20.6 nm (ref >=20.8)
LP-IR Score: 52 — ABNORMAL HIGH (ref <=45)
Large VLDL-P: 6.7 nmol/L — ABNORMAL HIGH (ref <=2.7)
Small LDL Particle Number: 1111 nmol/L — ABNORMAL HIGH (ref <=527)
Triglycerides: 176 mg/dL — ABNORMAL HIGH (ref 0–149)
VLDL Size: 47.8 nm — ABNORMAL HIGH (ref <=46.6)

## 2013-12-23 DIAGNOSIS — Z1212 Encounter for screening for malignant neoplasm of rectum: Secondary | ICD-10-CM | POA: Diagnosis not present

## 2013-12-23 DIAGNOSIS — Z01419 Encounter for gynecological examination (general) (routine) without abnormal findings: Secondary | ICD-10-CM | POA: Diagnosis not present

## 2014-01-19 ENCOUNTER — Telehealth (HOSPITAL_COMMUNITY): Payer: Self-pay

## 2014-01-19 NOTE — Telephone Encounter (Signed)
Encounter complete. 

## 2014-01-21 ENCOUNTER — Ambulatory Visit (HOSPITAL_COMMUNITY)
Admission: RE | Admit: 2014-01-21 | Discharge: 2014-01-21 | Disposition: A | Discharge status: Home / Self Care | Payer: Medicare Other | Source: Ambulatory Visit | Attending: Internal Medicine | Admitting: Internal Medicine

## 2014-01-21 DIAGNOSIS — R000 Tachycardia, unspecified: Secondary | ICD-10-CM

## 2014-01-21 DIAGNOSIS — I10 Essential (primary) hypertension: Secondary | ICD-10-CM

## 2014-01-21 DIAGNOSIS — R0600 Dyspnea, unspecified: Secondary | ICD-10-CM

## 2014-01-22 ENCOUNTER — Telehealth: Payer: Self-pay | Admitting: Internal Medicine

## 2014-01-22 NOTE — Telephone Encounter (Signed)
Pt requesting call regarding a stress test, BP was too high so test not performed.  [phone]

## 2014-01-22 NOTE — Telephone Encounter (Signed)
Phone call to patient. Advised her of Dr Hopper's response. She states she is not going to do the stress test at this time.

## 2014-01-22 NOTE — Telephone Encounter (Signed)
The cardiologist notified me that it has been rescheduled HCTZ can be taken that am.Also she could take Clonazepam that am as well pre test

## 2014-02-01 ENCOUNTER — Other Ambulatory Visit: Payer: Self-pay

## 2014-02-01 DIAGNOSIS — I10 Essential (primary) hypertension: Secondary | ICD-10-CM

## 2014-02-01 MED ORDER — LABETALOL HCL 300 MG PO TABS: ORAL_TABLET | ORAL | Status: DC

## 2014-02-08 ENCOUNTER — Telehealth: Payer: Self-pay | Admitting: Internal Medicine

## 2014-02-08 NOTE — Telephone Encounter (Signed)
I called patient back. Very confusing. She would like to have her stress test over at [facility]. Initially she could not get the stress test due to blood pressure being so high. She is wondering if she needs something prescribed in addition to the HCTZ she takes. I advised her she may need an office visit to come in a see you. Please advise.

## 2014-02-08 NOTE — Telephone Encounter (Signed)
The message I received is that she did not want to pursue the stress ; if she has changed her mind; I can schedule it

## 2014-02-08 NOTE — Telephone Encounter (Signed)
Pt called in and would like for Nurse to call her back about her stress Test?  Best number is cell number

## 2014-02-08 NOTE — Telephone Encounter (Signed)
Please bring BP cuff & readings to see if we need to change BP meds

## 2014-02-08 NOTE — Telephone Encounter (Signed)
Results, Dr Linna Darner?

## 2014-02-09 ENCOUNTER — Encounter: Payer: Self-pay | Admitting: Internal Medicine

## 2014-02-09 ENCOUNTER — Ambulatory Visit (INDEPENDENT_AMBULATORY_CARE_PROVIDER_SITE_OTHER): Payer: Medicare Other | Admitting: Internal Medicine

## 2014-02-09 ENCOUNTER — Encounter (HOSPITAL_COMMUNITY): Payer: Medicare Other

## 2014-02-09 VITALS — BP 120/84 | HR 79 | Temp 97.8°F | Ht 67.0 in | Wt 221.1 lb

## 2014-02-09 DIAGNOSIS — I10 Essential (primary) hypertension: Secondary | ICD-10-CM

## 2014-02-09 DIAGNOSIS — R0600 Dyspnea, unspecified: Secondary | ICD-10-CM | POA: Diagnosis not present

## 2014-02-09 MED ORDER — RAMIPRIL 5 MG PO CAPS: 5.0000 mg | ORAL_CAPSULE | Freq: Every day | ORAL | Status: DC

## 2014-02-09 NOTE — Progress Notes (Signed)
   Subjective:    Patient ID: Sydney Robinson, female    DOB: September 14, 1945, 69 y.o.   MRN: [id number]  HPI She is here to F/U on HTN. The stress test could not completed because of profound hypertension off the Beta blocker. The diastolic had been 542; she does not remember the systolic the day of the stress test 01/21/14.  Prior to the scheduled test her blood pressure ranged 90/67-126/86. Since that time blood pressures range 115/76-130/90 as per a detailed spread sheet. No medication side effects reported.  She has started walking 1 time a week without cardio pulmonary symptoms  Family history includes myocardial infarctions in paternal uncle in his 68s and maternal aunt in her 26s.   Review of Systems    Chest pain, palpitations, tachycardia, exertional dyspnea, paroxysmal nocturnal dyspnea, claudication or edema are absent.       Objective:   Physical Exam  Appears healthy and well-nourished & in no acute distress. As per CDC Guidelines ,Epic documents obesity as being present .  No carotid bruits are present.No neck vein distention present at 10 - 15 degrees. Thyroid normal to palpation  Heart rhythm and rate are normal with no gallop or murmur  Chest is clear with no increased work of breathing  There is no evidence of aortic aneurysm or renal artery bruits  Abdomen protuberant but soft with no organomegaly or masses. No HJR  No clubbing, cyanosis or edema present.  Pedal pulses are intact   No ischemic skin changes are present . Fingernails healthy   Alert and oriented. Strength, tone, DTRs reflexes normal        Assessment & Plan:  #1HTN #2 dyspnea, R/O anginal variant See orders

## 2014-02-09 NOTE — Progress Notes (Signed)
Pre visit review using our clinic review tool, if applicable. No additional management support is needed unless otherwise documented below in the visit note. 

## 2014-02-09 NOTE — Patient Instructions (Signed)
Report any significant cough or swelling of the lips or tongue with the new blood pressure pill. These are not expected but there are rare occurrences. Stop the medicine immediately should either occur.

## 2014-02-09 NOTE — Telephone Encounter (Signed)
Patient has been advised and transferred to scheduling for an appointment. 

## 2014-02-17 DIAGNOSIS — H1013 Acute atopic conjunctivitis, bilateral: Secondary | ICD-10-CM | POA: Diagnosis not present

## 2014-02-21 ENCOUNTER — Other Ambulatory Visit: Payer: Self-pay | Admitting: Internal Medicine

## 2014-04-21 ENCOUNTER — Ambulatory Visit (INDEPENDENT_AMBULATORY_CARE_PROVIDER_SITE_OTHER): Payer: Medicare Other | Admitting: Nurse Practitioner

## 2014-04-21 DIAGNOSIS — R0600 Dyspnea, unspecified: Secondary | ICD-10-CM

## 2014-04-21 DIAGNOSIS — I10 Essential (primary) hypertension: Secondary | ICD-10-CM

## 2014-04-21 NOTE — Progress Notes (Signed)
Exercise Treadmill Test  Pre-Exercise Testing Evaluation Rhythm: normal sinus  Rate: 83 bpm     Test  Exercise Tolerance Test Ordering MD: Ena Dawley, MD  Interpreting MD: Ignacia Bayley, NP  Unique Test No: 1  Treadmill:  1  Indication for ETT: exertional dyspnea  Contraindication to ETT: No   Stress Modality: exercise - treadmill  Cardiac Imaging Performed: non   Protocol: standard Bruce - maximal  Max BP:  160/85  Max MPHR (bpm):  153 85% MPR (bpm):  130  MPHR obtained (bpm):  131 % MPHR obtained:  85  Reached 85% MPHR (min:sec):  5:23 Total Exercise Time (min-sec):  6:00  Workload in METS:  7.0 Borg Scale: 13  Reason ETT Terminated:  desired heart rate attained    ST Segment Analysis At Rest: normal ST segments - no evidence of significant ST depression With Exercise: no evidence of significant ST depression  Other Information Arrhythmia:  No Angina during ETT:  absent (0) Quality of ETT:  diagnostic  ETT Interpretation:  normal - no evidence of ischemia by ST analysis  Comments: Fair exercise tolerance. No acute ST/T changes.  Recommendations: No acute ST/T changes.  No chest pain. No further ischemic work-up warranted at this time.

## 2014-04-23 ENCOUNTER — Encounter (HOSPITAL_COMMUNITY): Payer: Self-pay | Admitting: Emergency Medicine

## 2014-04-23 ENCOUNTER — Emergency Department (INDEPENDENT_AMBULATORY_CARE_PROVIDER_SITE_OTHER)
Admission: EM | Admit: 2014-04-23 | Discharge: 2014-04-23 | Disposition: A | Discharge status: Home / Self Care | Payer: Medicare Other | Source: Home / Self Care | Attending: Family Medicine | Admitting: Family Medicine

## 2014-04-23 DIAGNOSIS — J069 Acute upper respiratory infection, unspecified: Secondary | ICD-10-CM

## 2014-04-23 MED ORDER — IPRATROPIUM BROMIDE 0.06 % NA SOLN: 2.0000 | Freq: Four times a day (QID) | NASAL | Status: DC

## 2014-04-23 MED ORDER — MINOCYCLINE HCL 100 MG PO CAPS: 100.0000 mg | ORAL_CAPSULE | Freq: Two times a day (BID) | ORAL | Status: DC

## 2014-04-23 NOTE — ED Notes (Signed)
Patient c/o sinus headaches, persistent productive cough with yellow phlegm x 6 days. Patient is in NAD.

## 2014-04-23 NOTE — ED Provider Notes (Signed)
CSN: [id number]     Arrival date & time 04/23/14  1110 History   First MD Initiated Contact with Patient 04/23/14 1239     Chief Complaint  Patient presents with  . Sinusitis   (Consider location/radiation/quality/duration/timing/severity/associated sxs/prior Treatment) Patient is a 69 y.o. female presenting with URI. The history is provided by the patient.  URI Presenting symptoms: congestion, cough and rhinorrhea   Presenting symptoms: no fever   Severity:  Mild Onset quality:  Gradual Duration:  6 days Progression:  Unchanged Chronicity:  New Relieved by:  None tried Associated symptoms: no sinus pain, no swollen glands and no wheezing   Risk factors: no sick contacts     Past Medical History  Diagnosis Date  . 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 Diverticulosis   . Hyperlipidemia   . Hypertension   . Murmur    Past Surgical History  Procedure Laterality Date  . Dilation and curettage of uterus      x 3  . Ovarian cyst removal    . Colonoscopy w/ polypectomy  1998     negative 2003 & 2008; 2013  . Rotator cuff repair    . Tonsillectomy    . Neuroma resected foot      Right   . Fibroidectomy      x3  . G2 p2    . Hemorrhoidal banding  2011    Dr Earlean Shawl   Family History  Problem Relation Age of Onset  . Diabetes Mother   . Alzheimer's disease Mother   . Heart failure Mother   . Heart attack Maternal Aunt 59  . Stroke Paternal Aunt 7    cns aneurysm  . Uterine cancer Paternal Aunt   . Breast cancer Sister     2 sisters   . Diverticulitis Sister   . Diabetes Maternal Grandmother   . Diabetes Other     MGGM  . Diabetes Maternal Aunt   . Diabetes Maternal Uncle   . Diabetes Paternal Aunt    History  Substance Use Topics  . Smoking status: Never Smoker   . Smokeless tobacco: Never Used  . Alcohol Use: No     Comment: Wine rarely   OB History    No data available     Review of Systems  Constitutional: Negative.  Negative for fever.  HENT:  Positive for congestion, postnasal drip and rhinorrhea. Negative for facial swelling.   Respiratory: Positive for cough. Negative for shortness of breath and wheezing.   Cardiovascular: Negative.     Allergies  Review of patient's allergies indicates no known allergies.  Home Medications   Prior to Admission medications   Medication Sig Start Date End Date Taking? Authorizing Provider  Cetirizine HCl (ZYRTEC PO) Take 1 tablet by mouth daily.    Historical Provider, MD  clonazePAM (KLONOPIN) 0.5 MG tablet 1/2-1 by mouth qhs  prn 12/07/13   Hendricks Limes, MD  hydrochlorothiazide (MICROZIDE) 12.5 MG capsule Take 1 capsule (12.5 mg total) by mouth daily. 11/23/13   Hendricks Limes, MD  ipratropium (ATROVENT) 0.06 % nasal spray Place 2 sprays into both nostrils 4 (four) times daily. 04/23/14   Billy Fischer, MD  minocycline (MINOCIN,DYNACIN) 100 MG capsule Take 1 capsule (100 mg total) by mouth 2 (two) times daily. 04/23/14   Billy Fischer, MD  Multiple Vitamins-Minerals (CENTRUM SILVER PO) Take 1 tablet by mouth daily.    Historical Provider, MD  ramipril (ALTACE) 5 MG capsule  Take 1 capsule (5 mg total) by mouth daily. 02/09/14   Hendricks Limes, MD  simvastatin (ZOCOR) 40 MG tablet TAKE 1 TABLET (40 MG TOTAL) BY MOUTH AT BEDTIME. 02/22/14   Hendricks Limes, MD   BP 125/75 mmHg  Pulse 72  Temp(Src) 98.4 F (36.9 C) (Oral)  Resp 16  SpO2 100% Physical Exam  Constitutional: She is oriented to person, place, and time. She appears well-developed and well-nourished.  HENT:  Head: Normocephalic.  Right Ear: External ear normal.  Left Ear: External ear normal.  Nose: Rhinorrhea present. Right sinus exhibits no maxillary sinus tenderness and no frontal sinus tenderness. Left sinus exhibits no maxillary sinus tenderness and no frontal sinus tenderness.  Mouth/Throat: Oropharynx is clear and moist.  Eyes: Pupils are equal, round, and reactive to light.  Neck: Normal range of motion. Neck  supple.  Lymphadenopathy:    She has no cervical adenopathy.  Neurological: She is alert and oriented to person, place, and time.  Skin: Skin is warm and dry.  Nursing note and vitals reviewed.   ED Course  Procedures (including critical care time) Labs Review Labs Reviewed - No data to display  Imaging Review No results found.   MDM   1. URI (upper respiratory infection)        Billy Fischer, MD 04/23/14 1254

## 2014-04-28 ENCOUNTER — Telehealth: Payer: Self-pay | Admitting: Internal Medicine

## 2014-04-28 NOTE — Telephone Encounter (Signed)
Pt called in would like a nurse to call her about her blood pressure meds?     Best number [phone]

## 2014-04-28 NOTE — Telephone Encounter (Signed)
Left a message for patient to return call.

## 2014-04-28 NOTE — Telephone Encounter (Signed)
We need BP averages to address. Minimal Blood Pressure Goal= AVERAGE < 140/90;  Ideal is an AVERAGE < 135/85. This AVERAGE should be calculated from @ least 5-7 BP readings taken @ different times of day on different days of week. You should not respond to isolated BP readings , but rather the AVERAGE for that week .Please bring your  blood pressure cuff to office visits to verify that it is reliable.It  can also be checked against the blood pressure device at the pharmacy. Finger or wrist cuffs are not dependable; an arm cuff is.

## 2014-04-28 NOTE — Telephone Encounter (Signed)
Patient called back. She states she is currently on Labetolol 300 mg 1/2 in the morning and 1/2 at night. She feels light head x 3 days. She also takes HCTZ. Currently is on an antibiotic.

## 2014-04-28 NOTE — Telephone Encounter (Signed)
Phone call to patient. She has been advised    

## 2014-04-29 ENCOUNTER — Other Ambulatory Visit: Payer: Self-pay

## 2014-04-29 DIAGNOSIS — I10 Essential (primary) hypertension: Secondary | ICD-10-CM

## 2014-04-29 MED ORDER — RAMIPRIL 5 MG PO CAPS: 5.0000 mg | ORAL_CAPSULE | Freq: Every day | ORAL | Status: DC

## 2014-06-14 ENCOUNTER — Other Ambulatory Visit: Payer: Self-pay | Admitting: Internal Medicine

## 2014-06-14 ENCOUNTER — Other Ambulatory Visit: Payer: Self-pay

## 2014-06-14 DIAGNOSIS — Z1231 Encounter for screening mammogram for malignant neoplasm of breast: Secondary | ICD-10-CM

## 2014-06-19 ENCOUNTER — Other Ambulatory Visit: Payer: Self-pay | Admitting: Internal Medicine

## 2014-06-22 ENCOUNTER — Encounter: Payer: Self-pay | Admitting: Internal Medicine

## 2014-06-23 ENCOUNTER — Other Ambulatory Visit: Payer: Self-pay | Admitting: Emergency Medicine

## 2014-06-23 ENCOUNTER — Telehealth: Payer: Self-pay | Admitting: Emergency Medicine

## 2014-06-23 DIAGNOSIS — I10 Essential (primary) hypertension: Secondary | ICD-10-CM

## 2014-06-23 MED ORDER — LABETALOL HCL 100 MG PO TABS: 150.0000 mg | ORAL_TABLET | Freq: Two times a day (BID) | ORAL | Status: DC

## 2014-06-23 NOTE — Telephone Encounter (Signed)
Pt requested Ramipril to be removed from her med list due to side effect of coughing and for  labetalol 150mg  twice a day to be added. She stated she has been on labetalol since 02-24-2014.

## 2014-06-28 ENCOUNTER — Ambulatory Visit
Admission: RE | Admit: 2014-06-28 | Discharge: 2014-06-28 | Disposition: A | Discharge status: Home / Self Care | Payer: Medicare Other | Source: Ambulatory Visit

## 2014-06-28 DIAGNOSIS — Z1231 Encounter for screening mammogram for malignant neoplasm of breast: Secondary | ICD-10-CM | POA: Diagnosis not present

## 2014-11-01 DIAGNOSIS — Z23 Encounter for immunization: Secondary | ICD-10-CM | POA: Diagnosis not present

## 2014-11-12 ENCOUNTER — Ambulatory Visit (INDEPENDENT_AMBULATORY_CARE_PROVIDER_SITE_OTHER): Payer: Medicare Other | Admitting: Internal Medicine

## 2014-11-12 ENCOUNTER — Encounter: Payer: Self-pay | Admitting: Internal Medicine

## 2014-11-12 VITALS — BP 102/78 | HR 84 | Wt 197.0 lb

## 2014-11-12 DIAGNOSIS — R140 Abdominal distension (gaseous): Secondary | ICD-10-CM | POA: Diagnosis not present

## 2014-11-12 DIAGNOSIS — R1013 Epigastric pain: Secondary | ICD-10-CM | POA: Diagnosis not present

## 2014-11-12 MED ORDER — RANITIDINE HCL 150 MG PO TABS: 150.0000 mg | ORAL_TABLET | Freq: Two times a day (BID) | ORAL | Status: DC

## 2014-11-12 NOTE — Patient Instructions (Signed)
Reflux of gastric acid may be asymptomatic as this may occur mainly during sleep.The triggers for reflux  include stress; the "aspirin family" ; alcohol; peppermint; and caffeine (coffee, tea, cola, and chocolate). The aspirin family would include aspirin and the nonsteroidal agents such as ibuprofen &  Naproxen. Tylenol would not cause reflux. If having symptoms ; food & drink should be avoided for @ least 2 hours before going to bed.  

## 2014-11-12 NOTE — Progress Notes (Signed)
Pre visit review using our clinic review tool, if applicable. No additional management support is needed unless otherwise documented below in the visit note. 

## 2014-11-12 NOTE — Progress Notes (Signed)
   Subjective:    Patient ID: Sydney Robinson, female    DOB: 1945/03/26, 69 y.o.   MRN: [id number]  HPI  Her symptoms began 11/08/14 with upper abdominal bloating. She's had intermittent abdominal pain since then. She describes burning in her throat and discomfort below the inferior rib cage bilaterally. She also has burping with a bitter taste. She has taken Pepto-Bismol and TUMS with some relief. She also has used Alka-Seltzer. She has had loose stool for last 4 days without diarrhea.  She denies other GI or GU symptoms.  She does not drink or smoke. She does not ingest peppermint. She denies intake of nonsteroidals. She's increased her amount of tea recently. She will have coffee up to 2-3 cups a day.  Her gynecologic exam is due in January. Colonoscopy revealed diverticulosis. She has had tubular adenoma in 1999 but not on follow-up colonoscopies.  Review of Systems  There is no significant cough, sputum production,hemoptysis, wheezing,or  paroxysmal nocturnal dyspnea. Unexplained weight loss,  dysphagia, melena, rectal bleeding, or persistently small caliber stools are not present. Dysuria, pyuria, hematuria, frequency, nocturia or polyuria are denied.     Objective:   Physical Exam  Pertinent or positive findings include: She has an upper dental plate. She has slight tenderness in the right lower quadrant.  General appearance :adequately nourished; in no distress.  Eyes: No conjunctival inflammation or scleral icterus is present.  Oral exam:  Lips and gums are healthy appearing.There is no oropharyngeal erythema or exudate noted. Dental hygiene is good.  Heart:  Normal rate and regular rhythm. S1 and S2 normal without gallop, murmur, click, rub or other extra sounds    Lungs:Chest clear to auscultation; no wheezes, rhonchi,rales ,or rubs present.No increased work of breathing.   Abdomen: bowel sounds normal, soft  without masses, organomegaly or hernias noted.  No guarding or  rebound.   Vascular : all pulses equal ; no bruits present.  Skin:Warm & dry.  Intact without suspicious lesions or rashes ; no tenting    Lymphatic: No lymphadenopathy is noted about the head, neck, axilla.   Neuro: Strength, tone  normal.      Assessment & Plan:  #1 epigastric abdominal pain suggestive of reflux with esophagitis  #2 bloating  Plan: See orders

## 2014-11-26 ENCOUNTER — Telehealth: Payer: Self-pay

## 2014-11-26 NOTE — Telephone Encounter (Signed)
Call to the patient to schedule AWV; Agreed to come in 11/22 at 2:30 Stated that last year; 2 wellness assessments took place and she was billed and ended up paying this bill. Found [id number] x 1 in procedure codes. Did not see another exam for 2015 but states she had 2 and had to pay for one. Also stated she had "wellness" to soon.  Forward to [place] to Review as appropriate.

## 2014-11-30 ENCOUNTER — Telehealth: Payer: Self-pay

## 2014-11-30 NOTE — Patient Instructions (Signed)
  Sydney Robinson , Thank you for taking time to come for your Medicare Wellness Visit. I appreciate your ongoing commitment to your health goals. Please review the following plan we discussed and let me know if I can assist you in the future.   Exercise;  (recommended 60 minutes; 5 days a week) BMI reviewed and educated Educated regarding online nutrition programs as Gum[employer].nl and http://vang.com/;   To monitor High Fructose corn syrup;    These are the goals we discussed: Goals    . Exercise 150 minutes per week (moderate activity)     Will walk 60  Minutes M-F and will continue until Christmas and re-evaluate       This is a list of the screening recommended for you and due dates:  Health Maintenance  Topic Date Due  .  Hepatitis C: One time screening is recommended by Center for Disease Control  (CDC) for  adults born from [birth year] through 1963.   October 03, 1945  . Shingles Vaccine  01/13/2007  . DEXA scan (bone density measurement)  01/13/2012  . Pneumonia vaccines (1 of 2 - PCV13) 01/13/2012  . Flu Shot  08/09/2015  . Mammogram  06/27/2016  . Tetanus Vaccine  07/30/2018  . Colon Cancer Screening  05/02/2021

## 2014-11-30 NOTE — Telephone Encounter (Signed)
This patient came in for AWV but after a lengthy discussion;  could not determine if she had to wait 11 months between [id number] and G0439 or 12 months. Stated she was charged last year for AWV because there was not 11 months between her G0402 and G0438 and did not want to be charged for the AWV today.  Decided to reschedule for 12/6 at 8:30 to completed AWV; Will see Dr. Quay Burow at [facility] for CPE.  Will fup with [facility] regarding charges last year; see previous note

## 2014-12-14 ENCOUNTER — Encounter: Payer: Self-pay | Admitting: Internal Medicine

## 2014-12-14 ENCOUNTER — Other Ambulatory Visit (INDEPENDENT_AMBULATORY_CARE_PROVIDER_SITE_OTHER): Payer: Medicare Other

## 2014-12-14 ENCOUNTER — Ambulatory Visit (INDEPENDENT_AMBULATORY_CARE_PROVIDER_SITE_OTHER): Payer: Medicare Other | Admitting: Internal Medicine

## 2014-12-14 VITALS — BP 106/66 | HR 70 | Temp 98.1°F | Resp 16 | Wt 202.0 lb

## 2014-12-14 DIAGNOSIS — R7301 Impaired fasting glucose: Secondary | ICD-10-CM | POA: Diagnosis not present

## 2014-12-14 DIAGNOSIS — Z139 Encounter for screening, unspecified: Secondary | ICD-10-CM

## 2014-12-14 DIAGNOSIS — N959 Unspecified menopausal and perimenopausal disorder: Secondary | ICD-10-CM

## 2014-12-14 DIAGNOSIS — Z23 Encounter for immunization: Secondary | ICD-10-CM | POA: Diagnosis not present

## 2014-12-14 DIAGNOSIS — Z0000 Encounter for general adult medical examination without abnormal findings: Secondary | ICD-10-CM

## 2014-12-14 DIAGNOSIS — E785 Hyperlipidemia, unspecified: Secondary | ICD-10-CM | POA: Diagnosis not present

## 2014-12-14 DIAGNOSIS — I10 Essential (primary) hypertension: Secondary | ICD-10-CM

## 2014-12-14 LAB — CBC WITH DIFFERENTIAL/PLATELET
Basophils Absolute: 0 10*3/uL (ref 0.0–0.1)
Basophils Relative: 0.3 % (ref 0.0–3.0)
EOS PCT: 1.3 % (ref 0.0–5.0)
Eosinophils Absolute: 0.1 10*3/uL (ref 0.0–0.7)
HCT: 40.8 % (ref 36.0–46.0)
Hemoglobin: 13.7 g/dL (ref 12.0–15.0)
LYMPHS ABS: 2 10*3/uL (ref 0.7–4.0)
Lymphocytes Relative: 30.5 % (ref 12.0–46.0)
MCHC: 33.6 g/dL (ref 30.0–36.0)
MCV: 85.6 fl (ref 78.0–100.0)
MONO ABS: 0.5 10*3/uL (ref 0.1–1.0)
Monocytes Relative: 7.7 % (ref 3.0–12.0)
Neutro Abs: 4.1 10*3/uL (ref 1.4–7.7)
Neutrophils Relative %: 60.2 % (ref 43.0–77.0)
Platelets: 217 10*3/uL (ref 150.0–400.0)
RBC: 4.76 Mil/uL (ref 3.87–5.11)
RDW: 13.5 % (ref 11.5–15.5)
WBC: 6.7 10*3/uL (ref 4.0–10.5)

## 2014-12-14 LAB — COMPREHENSIVE METABOLIC PANEL
ALK PHOS: 61 U/L (ref 39–117)
ALT: 8 U/L (ref 0–35)
AST: 13 U/L (ref 0–37)
Albumin: 4.4 g/dL (ref 3.5–5.2)
BUN: 15 mg/dL (ref 6–23)
CALCIUM: 9.6 mg/dL (ref 8.4–10.5)
CHLORIDE: 104 meq/L (ref 96–112)
CO2: 30 mEq/L (ref 19–32)
CREATININE: 1.04 mg/dL (ref 0.40–1.20)
GFR: 67.79 mL/min (ref >60.00)
Glucose, Bld: 102 mg/dL — ABNORMAL HIGH (ref 70–99)
Potassium: 3.8 mEq/L (ref 3.5–5.1)
Sodium: 142 mEq/L (ref 135–145)
Total Bilirubin: 0.9 mg/dL (ref 0.2–1.2)
Total Protein: 7 g/dL (ref 6.0–8.3)

## 2014-12-14 LAB — LIPID PANEL
CHOLESTEROL: 199 mg/dL (ref 0–200)
HDL: 62.1 mg/dL (ref >39.00)
LDL Cholesterol: 120 mg/dL — ABNORMAL HIGH (ref 0–99)
NonHDL: 137.3
TRIGLYCERIDES: 85 mg/dL (ref 0.0–149.0)
Total CHOL/HDL Ratio: 3
VLDL: 17 mg/dL (ref 0.0–40.0)

## 2014-12-14 LAB — HEMOGLOBIN A1C: HEMOGLOBIN A1C: 5.5 % (ref 4.6–6.5)

## 2014-12-14 LAB — TSH: TSH: 2.53 u[IU]/mL (ref 0.35–4.50)

## 2014-12-14 NOTE — Assessment & Plan Note (Signed)
Taking simvastatin daily Check lipid panel, CMP

## 2014-12-14 NOTE — Assessment & Plan Note (Signed)
due for DEXA ordered

## 2014-12-14 NOTE — Patient Instructions (Addendum)
We have reviewed your prior records including labs and tests today.  Test(s) ordered today. Your results will be released to [place] (or called to you) after review, usually within 72hours after test completion. If any changes need to be made, you will be notified at that same time.  All other Health Maintenance issues reviewed.   All recommended immunizations and age-appropriate screenings are up-to-date/discussed.  prevnar vaccine administered today.  Schedule a shingles vaccine if it is covered by your insurance.  Medications reviewed and updated.  No changes recommended at this time.  Increase your exercise  Please schedule followup annually, sooner if needed.  Health Maintenance, Female Adopting a healthy lifestyle and getting preventive care can go a long way to promote health and wellness. Talk with your health care provider about what schedule of regular examinations is right for you. This is a good chance for you to check in with your provider about disease prevention and staying healthy. In between checkups, there are plenty of things you can do on your own. Experts have done a lot of research about which lifestyle changes and preventive measures are most likely to keep you healthy. Ask your health care provider for more information. WEIGHT AND DIET  Eat a healthy diet  Be sure to include plenty of vegetables, fruits, low-fat dairy products, and lean protein.  Do not eat a lot of foods high in solid fats, added sugars, or salt.  Get regular exercise. This is one of the most important things you can do for your health.  Most adults should exercise for at least 150 minutes each week. The exercise should increase your heart rate and make you sweat (moderate-intensity exercise).  Most adults should also do strengthening exercises at least twice a week. This is in addition to the moderate-intensity exercise.  Maintain a healthy weight  Body mass index (BMI) is a measurement  that can be used to identify possible weight problems. It estimates body fat based on height and weight. Your health care provider can help determine your BMI and help you achieve or maintain a healthy weight.  For females 17 years of age and older:   A BMI below 18.5 is considered underweight.  A BMI of 18.5 to 24.9 is normal.  A BMI of 25 to 29.9 is considered overweight.  A BMI of 30 and above is considered obese.  Watch levels of cholesterol and blood lipids  You should start having your blood tested for lipids and cholesterol at 67 years of age, then have this test every 5 years.  You may need to have your cholesterol levels checked more often if:  Your lipid or cholesterol levels are high.  You are older than 67 years of age.  You are at high risk for heart disease.  CANCER SCREENING   Lung Cancer  Lung cancer screening is recommended for adults 59-32 years old who are at high risk for lung cancer because of a history of smoking.  A yearly low-dose CT scan of the lungs is recommended for people who:  Currently smoke.  Have quit within the past 15 years.  Have at least a 30-pack-year history of smoking. A pack year is smoking an average of one pack of cigarettes a day for 1 year.  Yearly screening should continue until it has been 15 years since you quit.  Yearly screening should stop if you develop a health problem that would prevent you from having lung cancer treatment.  Breast Cancer  Practice  breast self-awareness. This means understanding how your breasts normally appear and feel.  It also means doing regular breast self-exams. Let your health care provider know about any changes, no matter how small.  If you are in your 20s or 30s, you should have a clinical breast exam (CBE) by a health care provider every 1-3 years as part of a regular health exam.  If you are 61 or older, have a CBE every year. Also consider having a breast X-ray (mammogram) every  year.  If you have a family history of breast cancer, talk to your health care provider about genetic screening.  If you are at high risk for breast cancer, talk to your health care provider about having an MRI and a mammogram every year.  Breast cancer gene (BRCA) assessment is recommended for women who have family members with BRCA-related cancers. BRCA-related cancers include:  Breast.  Ovarian.  Tubal.  Peritoneal cancers.  Results of the assessment will determine the need for genetic counseling and BRCA1 and BRCA2 testing. Cervical Cancer Your health care provider may recommend that you be screened regularly for cancer of the pelvic organs (ovaries, uterus, and vagina). This screening involves a pelvic examination, including checking for microscopic changes to the surface of your cervix (Pap test). You may be encouraged to have this screening done every 3 years, beginning at age 56.  For women ages 68-65, health care providers may recommend pelvic exams and Pap testing every 3 years, or they may recommend the Pap and pelvic exam, combined with testing for human papilloma virus (HPV), every 5 years. Some types of HPV increase your risk of cervical cancer. Testing for HPV may also be done on women of any age with unclear Pap test results.  Other health care providers may not recommend any screening for nonpregnant women who are considered low risk for pelvic cancer and who do not have symptoms. Ask your health care provider if a screening pelvic exam is right for you.  If you have had past treatment for cervical cancer or a condition that could lead to cancer, you need Pap tests and screening for cancer for at least 20 years after your treatment. If Pap tests have been discontinued, your risk factors (such as having a new sexual partner) need to be reassessed to determine if screening should resume. Some women have medical problems that increase the chance of getting cervical cancer. In  these cases, your health care provider may recommend more frequent screening and Pap tests. Colorectal Cancer  This type of cancer can be detected and often prevented.  Routine colorectal cancer screening usually begins at 67 years of age and continues through 67 years of age.  Your health care provider may recommend screening at an earlier age if you have risk factors for colon cancer.  Your health care provider may also recommend using home test kits to check for hidden blood in the stool.  A small camera at the end of a tube can be used to examine your colon directly (sigmoidoscopy or colonoscopy). This is done to check for the earliest forms of colorectal cancer.  Routine screening usually begins at age 47.  Direct examination of the colon should be repeated every 5-10 years through 67 years of age. However, you may need to be screened more often if early forms of precancerous polyps or small growths are found. Skin Cancer  Check your skin from head to toe regularly.  Tell your health care provider about any new  moles or changes in moles, especially if there is a change in a mole's shape or color.  Also tell your health care provider if you have a mole that is larger than the size of a pencil eraser.  Always use sunscreen. Apply sunscreen liberally and repeatedly throughout the day.  Protect yourself by wearing long sleeves, pants, a wide-brimmed hat, and sunglasses whenever you are outside. HEART DISEASE, DIABETES, AND HIGH BLOOD PRESSURE   High blood pressure causes heart disease and increases the risk of stroke. High blood pressure is more likely to develop in:  People who have blood pressure in the high end of the normal range (130-139/85-89 mm Hg).  People who are overweight or obese.  People who are African American.  If you are 26-14 years of age, have your blood pressure checked every 3-5 years. If you are 75 years of age or older, have your blood pressure checked  every year. You should have your blood pressure measured twice--once when you are at a hospital or clinic, and once when you are not at a hospital or clinic. Record the average of the two measurements. To check your blood pressure when you are not at a hospital or clinic, you can use:  An automated blood pressure machine at a pharmacy.  A home blood pressure monitor.  If you are between 52 years and 44 years old, ask your health care provider if you should take aspirin to prevent strokes.  Have regular diabetes screenings. This involves taking a blood sample to check your fasting blood sugar level.  If you are at a normal weight and have a low risk for diabetes, have this test once every three years after 67 years of age.  If you are overweight and have a high risk for diabetes, consider being tested at a younger age or more often. PREVENTING INFECTION  Hepatitis B  If you have a higher risk for hepatitis B, you should be screened for this virus. You are considered at high risk for hepatitis B if:  You were born in a country where hepatitis B is common. Ask your health care provider which countries are considered high risk.  Your parents were born in a high-risk country, and you have not been immunized against hepatitis B (hepatitis B vaccine).  You have HIV or AIDS.  You use needles to inject street drugs.  You live with someone who has hepatitis B.  You have had sex with someone who has hepatitis B.  You get hemodialysis treatment.  You take certain medicines for conditions, including cancer, organ transplantation, and autoimmune conditions. Hepatitis C  Blood testing is recommended for:  Everyone born from [birth year] through 1965.  Anyone with known risk factors for hepatitis C. Sexually transmitted infections (STIs)  You should be screened for sexually transmitted infections (STIs) including gonorrhea and chlamydia if:  You are sexually active and are younger than 67 years  of age.  You are older than 67 years of age and your health care provider tells you that you are at risk for this type of infection.  Your sexual activity has changed since you were last screened and you are at an increased risk for chlamydia or gonorrhea. Ask your health care provider if you are at risk.  If you do not have HIV, but are at risk, it may be recommended that you take a prescription medicine daily to prevent HIV infection. This is called pre-exposure prophylaxis (PrEP). You are considered at risk if:  You are sexually active and do not regularly use condoms or know the HIV status of your partner(s).  You take drugs by injection.  You are sexually active with a partner who has HIV. Talk with your health care provider about whether you are at high risk of being infected with HIV. If you choose to begin PrEP, you should first be tested for HIV. You should then be tested every 3 months for as long as you are taking PrEP.  PREGNANCY   If you are premenopausal and you may become pregnant, ask your health care provider about preconception counseling.  If you may become pregnant, take 400 to 800 micrograms (mcg) of folic acid every day.  If you want to prevent pregnancy, talk to your health care provider about birth control (contraception). OSTEOPOROSIS AND MENOPAUSE   Osteoporosis is a disease in which the bones lose minerals and strength with aging. This can result in serious bone fractures. Your risk for osteoporosis can be identified using a bone density scan.  If you are 53 years of age or older, or if you are at risk for osteoporosis and fractures, ask your health care provider if you should be screened.  Ask your health care provider whether you should take a calcium or vitamin D supplement to lower your risk for osteoporosis.  Menopause may have certain physical symptoms and risks.  Hormone replacement therapy may reduce some of these symptoms and risks. Talk to your  health care provider about whether hormone replacement therapy is right for you.  HOME CARE INSTRUCTIONS   Schedule regular health, dental, and eye exams.  Stay current with your immunizations.   Do not use any tobacco products including cigarettes, chewing tobacco, or electronic cigarettes.  If you are pregnant, do not drink alcohol.  If you are breastfeeding, limit how much and how often you drink alcohol.  Limit alcohol intake to no more than 1 drink per day for nonpregnant women. One drink equals 12 ounces of beer, 5 ounces of wine, or 1 ounces of hard liquor.  Do not use street drugs.  Do not share needles.  Ask your health care provider for help if you need support or information about quitting drugs.  Tell your health care provider if you often feel depressed.  Tell your health care provider if you have ever been abused or do not feel safe at home.   This information is not intended to replace advice given to you by your health care provider. Make sure you discuss any questions you have with your health care provider.   Document Released: 07/10/2010 Document Revised: 01/15/2014 Document Reviewed: 11/26/2012 Elsevier Interactive Patient Education [employer].

## 2014-12-14 NOTE — Assessment & Plan Note (Signed)
Will check A1c Encouraged low sugar/provider diet regular exercise Encouraged weight loss

## 2014-12-14 NOTE — Progress Notes (Signed)
HPI Review of Systems   Physical Exam    Subjective:    Sydney Robinson is a 69 y.o. female who presents for Medicare Annual/Subsequent preventive examination / Physical exam.  Preventive Screening-Counseling & Management  Tobacco History  Smoking status  . Never Smoker   Smokeless tobacco  . Never Used       Current Problems (verified) Patient Active Problem List   Diagnosis Date Noted  . Fasting hyperglycemia 11/04/2012  . RAD (reactive airway disease) 12/03/2011  . DIVERTICULOSIS, COLON 08/12/2009  . Menopausal and postmenopausal disorder 08/12/2009  . LUMBAR RADICULOPATHY, RIGHT 04/19/2008  . SYMPTOM, MURMUR, CARDIAC, UNDIAGNOSED 09/03/2006  . Hyperlipidemia 06/04/2006  . Essential hypertension 06/04/2006  . History of colonic polyps 06/04/2006    Medications Prior to Visit Current Outpatient Prescriptions on File Prior to Visit  Medication Sig Dispense Refill  . Cetirizine HCl (ZYRTEC PO) Take 1 tablet by mouth daily.    . clonazePAM (KLONOPIN) 0.5 MG tablet 1/2-1 by mouth qhs  prn 30 tablet 2  . hydrochlorothiazide (MICROZIDE) 12.5 MG capsule TAKE 1 CAPSULE (12.5 MG TOTAL) BY MOUTH DAILY. 90 capsule 1  . ipratropium (ATROVENT) 0.06 % nasal spray Place 2 sprays into both nostrils 4 (four) times daily. 15 mL 1  . labetalol (NORMODYNE) 100 MG tablet Take 1.5 tablets (150 mg total) by mouth 2 (two) times daily. 60 tablet 0  . Multiple Vitamins-Minerals (CENTRUM SILVER PO) Take 1 tablet by mouth daily.    . ranitidine (ZANTAC) 150 MG tablet Take 1 tablet (150 mg total) by mouth 2 (two) times daily. 60 tablet 2  . simvastatin (ZOCOR) 40 MG tablet TAKE 1 TABLET (40 MG TOTAL) BY MOUTH AT BEDTIME. 90 tablet 2   No current facility-administered medications on file prior to visit.    Current Medications (verified) Current Outpatient Prescriptions  Medication Sig Dispense Refill  . Cetirizine HCl (ZYRTEC PO) Take 1 tablet by mouth daily.    . clonazePAM  (KLONOPIN) 0.5 MG tablet 1/2-1 by mouth qhs  prn 30 tablet 2  . hydrochlorothiazide (MICROZIDE) 12.5 MG capsule TAKE 1 CAPSULE (12.5 MG TOTAL) BY MOUTH DAILY. 90 capsule 1  . ipratropium (ATROVENT) 0.06 % nasal spray Place 2 sprays into both nostrils 4 (four) times daily. 15 mL 1  . labetalol (NORMODYNE) 100 MG tablet Take 1.5 tablets (150 mg total) by mouth 2 (two) times daily. 60 tablet 0  . Multiple Vitamins-Minerals (CENTRUM SILVER PO) Take 1 tablet by mouth daily.    . ranitidine (ZANTAC) 150 MG tablet Take 1 tablet (150 mg total) by mouth 2 (two) times daily. 60 tablet 2  . simvastatin (ZOCOR) 40 MG tablet TAKE 1 TABLET (40 MG TOTAL) BY MOUTH AT BEDTIME. 90 tablet 2   No current facility-administered medications for this visit.     Allergies (verified) Review of patient's allergies indicates no known allergies.   PAST HISTORY  Family History Family History  Problem Relation Age of Onset  . Diabetes Mother   . Alzheimer's disease Mother   . Heart failure Mother   . Heart attack Maternal Aunt 59  . Stroke Paternal Aunt 73    cns aneurysm  . Uterine cancer Paternal Aunt   . Breast cancer Sister     2 sisters   . Diverticulitis Sister   . Diabetes Maternal Grandmother   . Diabetes Other     MGGM  . Colon cancer Other     cousin  . Diabetes Maternal Aunt   .  Diabetes Maternal Uncle   . Diabetes Paternal Aunt     Social History Social History  Substance Use Topics  . Smoking status: Never Smoker   . Smokeless tobacco: Never Used  . Alcohol Use: No     Comment: Wine rarely     Are there smokers in your home (other than you)? No  Risk Factors Current exercise habits:  Dietary issues discussed: watches salt and fat, could do better with carbohydrates, tries to limit sweets   Cardiac risk factors: advanced age (older than 66 for men, 10 for women), dyslipidemia, hypertension and obesity (BMI >= 30 kg/m2).  Depression Screen (Note: if answer to either of the  following is "Yes", a more complete depression screening is indicated)   Over the past two weeks, have you felt down, depressed or hopeless? No  Over the past two weeks, have you felt little interest or pleasure in doing things? No  Have you lost interest or pleasure in daily life? No  Do you often feel hopeless? No  Do you cry easily over simple problems? No  Activities of Daily Living In your present state of health, do you have any difficulty performing the following activities?:  Driving? Yes Managing money?  Yes Feeding yourself? Yes Getting from bed to chair? Yes Climbing a flight of stairs? Yes Preparing food and eating?: Yes Bathing or showering? Yes Getting dressed: Yes Getting to the toilet? Yes Using the toilet:Yes Moving around from place to place: Yes In the past year have you fallen or had a near fall?:No, one month ago - was wearing flip flops, addicent   Are you sexually active?  Yes  Do you have more than one partner?  No  Hearing Difficulties: No Do you often ask people to speak up or repeat themselves? No Do you experience ringing or noises in your ears? No Do you have difficulty understanding soft or whispered voices? No   Do you feel that you have a problem with memory? No  Do you often misplace items? No  Do you feel safe at home?  Yes  Cognitive Testing  Alert? Yes  Normal Appearance?Yes  Oriented to person? Yes  Place? Yes   Time? Yes  Recall of three objects?  Yes  Can perform simple calculations? Yes  Displays appropriate judgment?Yes  Can read the correct time from a watch face?Yes   Advanced Directives have been discussed with the patient? Yes  List the Names of Other Physician/Practitioners you currently use: 1.  Only eye doctor, she does not recall name   Immunization History  Administered Date(s) Administered  . Influenza Split 11/15/2010  . Influenza Whole 01/08/2001, 10/08/2007  . Influenza, High Dose Seasonal PF 11/01/2014  .  Influenza, Seasonal, Injecte, Preservative Fre 12/03/2011  . Influenza,inj,Quad PF,36+ Mos 10/13/2013  . Influenza-Unspecified 10/14/2012, 11/01/2014  . Pneumococcal Conjugate-13 12/14/2014  . Td 07/29/2008    Screening Tests Health Maintenance  Topic Date Due  . Hepatitis C Screening  19-Feb-1945  . ZOSTAVAX  01/13/2007  . DEXA SCAN  01/13/2012  . INFLUENZA VACCINE  08/09/2015  . PNA vac Low Risk Adult (2 of 2 - PPSV23) 12/14/2015  . MAMMOGRAM  06/27/2016  . TETANUS/TDAP  07/30/2018  . COLONOSCOPY  05/02/2021    Review of Systems    Constitutional: Negative for fever, chills, appetite change and fatigue.  HENT: Negative for hearing loss.   Eyes: Negative for visual disturbance.  Respiratory: Negative for cough, shortness of breath and wheezing.  Cardiovascular: Negative for chest pain, palpitations and leg swelling.  Gastrointestinal: Negative for nausea, abdominal pain, diarrhea, constipation and blood in stool.       GERD on occasion  Genitourinary: Negative for dysuria and hematuria.  Musculoskeletal: Negative for back pain and arthralgias.  Skin: Negative for rash.  Neurological: Negative for dizziness, weakness, light-headedness, numbness and headaches.  Psychiatric/Behavioral: Negative for dysphoric mood. The patient is not nervous/anxious.     Objective:      Body mass index is 31.15 kg/(m^2). BP 106/66 mmHg  Pulse 70  Temp(Src) 98.1 F (36.7 C) (Oral)  Resp 16  Wt 202 lb (91.627 kg)  SpO2 98%  Physical Exam: Constitutional: She appears well-developed and well-nourished. No distress.  HENT:  Head: Normocephalic and atraumatic.  Right Ear: External ear normal.  Left Ear: External ear normal.  Mouth/Throat: Oropharynx is clear and moist.  Normal bilateral ear canals and tympanic membranes  Eyes: Conjunctivae and EOM are normal.  Neck: Neck supple. No tracheal deviation present. No thyromegaly present.  No carotid bruit  Cardiovascular: Normal rate,  regular rhythm and normal heart sounds.   No murmur heard. Pulmonary/Chest: Effort normal and breath sounds normal. No respiratory distress. She has no wheezes. She has no rales.  Abdominal: Soft. She exhibits no distension. There is no tenderness.  Musculoskeletal: She exhibits no edema.  Lymphadenopathy:    She has no cervical adenopathy.  Skin: Skin is warm and dry. She is not diaphoretic.  Psychiatric: She has a normal mood and affect. Her behavior is normal.       Assessment:    Physical Exam/wellness visit: Screening blood work ordered, including hepatitis c dexa ordered mammo up to date Colonoscopy up to date EKG done in the past year prevnar given today, discussed shingles vaccine ( will check with insurance if it is covered) Other immunizations up to date Stressed regular exercise encouraged weight loss No depression/anxiety Discussed skin cancer prevention/monitoring skin       Plan:     During the course of the visit the patient was educated and counseled about appropriate screening and preventive services including:    Pneumococcal vaccine   Bone densitometry screening  Diabetes screening  Increase exercise  Diet review for nutrition referral? no   Patient Instructions (the written plan) was given to the patient.  Medicare Attestation I have personally reviewed: The patient's medical and social history Their use of alcohol, tobacco or illicit drugs Their current medications and supplements The patient's functional ability including ADLs,fall risks, home safety risks, cognitive, and hearing and visual impairment Diet and physical activities Evidence for depression or mood disorders  The patient's weight, height, BMI, and visual acuity have been recorded in the chart.  I have made referrals, counseling, and provided education to the patient based on review of the above and I have provided the patient with a written personalized care plan for  preventive services.     See problem list for assessment and plan for chronic medical problems   [name], MD   12/14/2014

## 2014-12-14 NOTE — Assessment & Plan Note (Signed)
Blood pressure well controlled Continue current medication 

## 2014-12-14 NOTE — Progress Notes (Signed)
Pre visit review using our clinic review tool, if applicable. No additional management support is needed unless otherwise documented below in the visit note. 

## 2014-12-15 LAB — HEPATITIS C ANTIBODY: HCV AB: NEGATIVE

## 2014-12-16 ENCOUNTER — Other Ambulatory Visit: Payer: Self-pay | Admitting: Internal Medicine

## 2014-12-16 ENCOUNTER — Encounter: Payer: Self-pay | Admitting: Internal Medicine

## 2014-12-27 ENCOUNTER — Other Ambulatory Visit: Payer: Self-pay | Admitting: Internal Medicine

## 2014-12-28 ENCOUNTER — Other Ambulatory Visit: Payer: Self-pay

## 2014-12-28 MED ORDER — LABETALOL HCL 100 MG PO TABS: 150.0000 mg | ORAL_TABLET | Freq: Two times a day (BID) | ORAL | Status: DC

## 2014-12-28 NOTE — Addendum Note (Signed)
Addended by: Karle Barr on: 12/28/2014 08:31 AM   Modules accepted: Orders

## 2014-12-31 ENCOUNTER — Telehealth: Payer: Self-pay | Admitting: *Deleted

## 2014-12-31 MED ORDER — LABETALOL HCL 300 MG PO TABS: 150.0000 mg | ORAL_TABLET | Freq: Two times a day (BID) | ORAL | Status: DC

## 2014-12-31 NOTE — Telephone Encounter (Signed)
Received call pt states she came in for her appt 12/6, and requested a refill on her Labetalol. She states she was taking Labetalol 300 mg take 1/2 twice a day. The rx that was sent was for 100 mg take 1.5. Twice a day. Pt states she does not want all those pills, she would like what she had before 300 mg and she can cut in half. Verified pharmacy inform will send to [facility].../lmb

## 2015-01-11 DIAGNOSIS — Z124 Encounter for screening for malignant neoplasm of cervix: Secondary | ICD-10-CM | POA: Diagnosis not present

## 2015-01-11 DIAGNOSIS — Z6832 Body mass index (BMI) 32.0-32.9, adult: Secondary | ICD-10-CM | POA: Diagnosis not present

## 2015-01-28 ENCOUNTER — Other Ambulatory Visit: Payer: Self-pay | Admitting: Emergency Medicine

## 2015-01-28 DIAGNOSIS — R1013 Epigastric pain: Secondary | ICD-10-CM

## 2015-01-28 MED ORDER — RANITIDINE HCL 150 MG PO TABS: 150.0000 mg | ORAL_TABLET | Freq: Two times a day (BID) | ORAL | Status: DC

## 2015-02-10 DIAGNOSIS — N958 Other specified menopausal and perimenopausal disorders: Secondary | ICD-10-CM | POA: Diagnosis not present

## 2015-02-10 DIAGNOSIS — N644 Mastodynia: Secondary | ICD-10-CM | POA: Diagnosis not present

## 2015-06-16 ENCOUNTER — Other Ambulatory Visit: Payer: Self-pay | Admitting: Internal Medicine

## 2015-06-16 DIAGNOSIS — Z1231 Encounter for screening mammogram for malignant neoplasm of breast: Secondary | ICD-10-CM

## 2015-06-21 ENCOUNTER — Other Ambulatory Visit: Payer: Self-pay | Admitting: Internal Medicine

## 2015-06-22 ENCOUNTER — Telehealth: Payer: Self-pay | Admitting: Emergency Medicine

## 2015-06-22 NOTE — Telephone Encounter (Signed)
Pts last OV 12/2014. Last refill for Klonopin 2015. Please advise.

## 2015-06-22 NOTE — Telephone Encounter (Signed)
Faxed Klonopin to [facility]

## 2015-07-04 ENCOUNTER — Ambulatory Visit
Admission: RE | Admit: 2015-07-04 | Discharge: 2015-07-04 | Disposition: A | Discharge status: Home / Self Care | Payer: Medicare Other | Source: Ambulatory Visit | Attending: Internal Medicine | Admitting: Internal Medicine

## 2015-07-04 ENCOUNTER — Other Ambulatory Visit: Payer: Self-pay | Admitting: Internal Medicine

## 2015-07-04 DIAGNOSIS — Z1231 Encounter for screening mammogram for malignant neoplasm of breast: Secondary | ICD-10-CM | POA: Diagnosis not present

## 2015-08-06 ENCOUNTER — Other Ambulatory Visit: Payer: Self-pay | Admitting: Internal Medicine

## 2015-08-06 DIAGNOSIS — R1013 Epigastric pain: Secondary | ICD-10-CM

## 2015-08-26 DIAGNOSIS — H10413 Chronic giant papillary conjunctivitis, bilateral: Secondary | ICD-10-CM | POA: Diagnosis not present

## 2015-09-15 ENCOUNTER — Other Ambulatory Visit: Payer: Self-pay | Admitting: Internal Medicine

## 2015-10-05 DIAGNOSIS — Z23 Encounter for immunization: Secondary | ICD-10-CM | POA: Diagnosis not present

## 2015-10-07 ENCOUNTER — Telehealth: Payer: Self-pay | Admitting: *Deleted

## 2015-10-07 MED ORDER — SCOPOLAMINE 1 MG/3DAYS TD PT72: 1.0000 | MEDICATED_PATCH | TRANSDERMAL | 0 refills | Status: DC

## 2015-10-07 NOTE — Telephone Encounter (Signed)
Rec'd call pt states she is going on a 7 day cruise next week wanting to see if MD would rx something for motion sickness. MD out of office pls advise...Sydney Robinson

## 2015-10-07 NOTE — Telephone Encounter (Signed)
Notified pt w/MD response.../lmb 

## 2015-10-07 NOTE — Telephone Encounter (Signed)
Have sent in scopolamine patches that she can use. She will put it behind her ear morning of cruise. Switch in 3 days. Given 2 patches (second patch switch ears and remove old patch on day 3) which should be sufficient for the trip. Another option is dramamine over the counter which is taken only as needed if she gets problems.

## 2015-12-13 ENCOUNTER — Other Ambulatory Visit: Payer: Self-pay | Admitting: Internal Medicine

## 2015-12-23 ENCOUNTER — Ambulatory Visit (INDEPENDENT_AMBULATORY_CARE_PROVIDER_SITE_OTHER): Payer: Medicare Other | Admitting: Internal Medicine

## 2015-12-23 ENCOUNTER — Encounter: Payer: Self-pay | Admitting: Internal Medicine

## 2015-12-23 VITALS — BP 100/74 | HR 75 | Temp 98.1°F | Resp 16 | Ht 68.0 in | Wt 207.0 lb

## 2015-12-23 DIAGNOSIS — E7800 Pure hypercholesterolemia, unspecified: Secondary | ICD-10-CM | POA: Diagnosis not present

## 2015-12-23 DIAGNOSIS — R7301 Impaired fasting glucose: Secondary | ICD-10-CM | POA: Diagnosis not present

## 2015-12-23 DIAGNOSIS — Z0000 Encounter for general adult medical examination without abnormal findings: Secondary | ICD-10-CM

## 2015-12-23 DIAGNOSIS — M545 Low back pain, unspecified: Secondary | ICD-10-CM | POA: Insufficient documentation

## 2015-12-23 DIAGNOSIS — I10 Essential (primary) hypertension: Secondary | ICD-10-CM | POA: Diagnosis not present

## 2015-12-23 DIAGNOSIS — Z23 Encounter for immunization: Secondary | ICD-10-CM | POA: Diagnosis not present

## 2015-12-23 DIAGNOSIS — G8929 Other chronic pain: Secondary | ICD-10-CM

## 2015-12-23 MED ORDER — LABETALOL HCL 300 MG PO TABS: 150.0000 mg | ORAL_TABLET | Freq: Two times a day (BID) | ORAL | 3 refills | Status: DC

## 2015-12-23 NOTE — Progress Notes (Signed)
Pre visit review using our clinic review tool, if applicable. No additional management support is needed unless otherwise documented below in the visit note. 

## 2015-12-23 NOTE — Progress Notes (Signed)
Subjective:    Patient ID: Sydney Robinson, female    DOB: Nov 15, 1945, 70 y.o.   MRN: [id number]  HPI Here for medicare wellness exam and follow up of her chronic medical conditions.  Hypertension: She is taking her medication daily. She is compliant with a low sodium diet.  She denies chest pain, palpitations, edema, shortness of breath and regular headaches. She is exercising regularly.  She does monitor her blood pressure at home at occasion an it is controlleed.    Hyperlipidemia: She is taking her medication daily. She is compliant with a low fat/cholesterol diet. She is exercising regularly. She denies myalgias.   Hyperglycemia: She avoids sweets and is exercising.   GERD:  She is taking her medication as needed only.  She takes it 1-2 times a week.  She denies daily GERD symptoms and feels her GERD is well controlled with taking the medication as needed.    Insomnia:  She takes clonazepam as needed.  She denies side effects and it works well.    Back pain:  She has intermittent lower back pain - it is intermittent. She denies radiation/numbness/tingling/weakness in her legs.  She just restart exercise and is hoping that will help.  She may want it evaluated if it does not improve.    I have personally reviewed and have noted 1.The patient's medical and social history 2.Their use of alcohol, tobacco or illicit drugs 3.Their current medications and supplements 4.The patient's functional ability including ADL's, fall risks, home safety risks and hearing or visual impairment. 5.Diet and physical activities 6.Evidence for depression or mood disorders 7.Care team reviewed - eye - Dr Delman Cheadle, GI - dr Earlean Shawl, Gyn - Dr Ophelia Charter   Are there smokers in your home (other than you)? No  Risk Factors [facility] [facility] [place] [facility] [place] Dietary issues discussed: well balanced, not as much and veges, tries to avoid  fried  Cardiac risk factors: advanced age, hypertension, hyperlipidemia, and obesity (BMI >= 30 kg/m2).  Depression Screen  Have you felt down, depressed or hopeless? No  Have you felt little interest or pleasure in doing things?  No  Activities of Daily Living In your present state of health, do you have any difficulty performing the following activities?:  Driving? No Managing money?  No Feeding yourself? No Getting from bed to chair? No Climbing a flight of stairs? No Preparing food and eating?: No Bathing or showering? No Getting dressed: No Getting to/using the toilet? No Moving around from place to place: No In the past year have you fallen or had a near fall?: No   Are you sexually active?  No  Do you have more than one partner?  N/A  Hearing Difficulties: No Do you often ask people to speak up or repeat themselves? No Do you experience ringing or noises in your ears? No Do you have difficulty understanding soft or whispered voices? No Vision:              Any change in vision:  Mild - new prescription this year             Up to date with eye exam:   Up to date   Memory:  Do you feel that you have a problem with memory? No  Do you often misplace items? No  Do you feel safe at home?  Yes  Cognitive Testing  Alert, Orientated? Yes  Normal Appearance? Yes  Recall of three objects?  Yes  Can perform simple calculations? Yes  Displays appropriate judgment? Yes  Can read the correct time from a watch face? Yes   Advanced Directives have been discussed with the patient? Yes - in place   Medications and allergies reviewed with patient and updated if appropriate.  Patient Active Problem List   Diagnosis Date Noted  . Fasting hyperglycemia 11/04/2012  . RAD (reactive airway disease) 12/03/2011  . DIVERTICULOSIS, COLON 08/12/2009  . Menopausal and postmenopausal disorder 08/12/2009  . LUMBAR RADICULOPATHY, RIGHT 04/19/2008  . SYMPTOM, MURMUR, CARDIAC, UNDIAGNOSED  09/03/2006  . Hyperlipidemia 06/04/2006  . Essential hypertension 06/04/2006  . History of colonic polyps 06/04/2006    Current Outpatient Prescriptions on File Prior to Visit  Medication Sig Dispense Refill  . Cetirizine HCl (ZYRTEC PO) Take 1 tablet by mouth daily.    . clonazePAM (KLONOPIN) 0.5 MG tablet TAKE 1/2-1 TABLET AT BEDTIME AS NEEDED 30 tablet 2  . hydrochlorothiazide (MICROZIDE) 12.5 MG capsule Take 1 capsule (12.5 mg total) by mouth daily. Yearly physical w/labs are due must see MD for future refills 30 capsule 0  . ranitidine (ZANTAC) 150 MG tablet Take 1 tablet (150 mg total) by mouth 2 (two) times daily. 180 tablet 1  . simvastatin (ZOCOR) 40 MG tablet TAKE 1 TABLET (40 MG TOTAL) BY MOUTH AT BEDTIME. 90 tablet 2   No current facility-administered medications on file prior to visit.     Past Medical History:  Diagnosis Date  . 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 Diverticulosis   . Hyperlipidemia   . Hypertension   . Murmur     Past Surgical History:  Procedure Laterality Date  . COLONOSCOPY W/ POLYPECTOMY  1998    negative 2003 & 2008; 2013  . DILATION AND CURETTAGE OF UTERUS     x 3  . fibroidectomy     x3  . g2 p2    . hemorrhoidal banding  2011   Dr Earlean Shawl  . Neuroma Resected Foot     Right   . OVARIAN CYST REMOVAL    . ROTATOR CUFF REPAIR    . TONSILLECTOMY      Social History   Social History  . Marital status: Married    Spouse name: N/A  . Number of children: N/A  . Years of education: N/A   Social History Main Topics  . Smoking status: Never Smoker  . Smokeless tobacco: Never Used  . Alcohol use No     Comment: Wine rarely  . Drug use: No  . Sexual activity: Not Asked   Other Topics Concern  . None   Social History Narrative   Exercise: walking 2-3 times a week    Family History  Problem Relation Age of Onset  . Diabetes Mother   . Alzheimer's disease Mother   . Heart failure Mother   . Heart attack Maternal Aunt 59  .  Stroke Paternal Aunt 27    cns aneurysm  . Uterine cancer Paternal Aunt   . Breast cancer Sister     2 sisters   . Diverticulitis Sister   . Diabetes Maternal Grandmother   . Diabetes Other     MGGM  . Colon cancer Other     cousin  . Diabetes Maternal Aunt   . Diabetes Maternal Uncle   . Diabetes Paternal Aunt     Review of Systems  Constitutional: Negative for appetite change, chills, fever and unexpected weight change.  HENT: Negative for hearing loss and  tinnitus.   Eyes: Negative for visual disturbance.  Respiratory: Negative for cough, shortness of breath and wheezing.   Cardiovascular: Positive for palpitations (rare, transient). Negative for chest pain and leg swelling.  Gastrointestinal: Negative for abdominal pain, blood in stool, constipation, diarrhea and nausea.  Genitourinary: Negative for dysuria and hematuria.  Musculoskeletal: Positive for arthralgias (right hip, left hip) and back pain.  Skin: Negative for color change and rash.  Neurological: Positive for headaches (occ). Negative for dizziness and light-headedness.  Psychiatric/Behavioral: Negative for decreased concentration. The patient is not nervous/anxious.        Objective:   Vitals:   12/23/15 1522  BP: 100/74  Pulse: 75  Resp: 16  Temp: 98.1 F (36.7 C)   Filed Weights   12/23/15 1522  Weight: 207 lb (93.9 kg)   Body mass index is 31.47 kg/m.   Physical Exam Constitutional: She appears well-developed and well-nourished. No distress.  HENT:  Head: Normocephalic and atraumatic.  Right Ear: External ear normal. Normal ear canal and TM Left Ear: External ear normal.  Normal ear canal and TM Mouth/Throat: Oropharynx is clear and moist.  Eyes: Conjunctivae and EOM are normal.  Neck: Neck supple. No tracheal deviation present. No thyromegaly present.  No carotid bruit  Cardiovascular: Normal rate, regular rhythm and normal heart sounds.   1/6 systolic murmur heard.  No  edema. Pulmonary/Chest: Effort normal and breath sounds normal. No respiratory distress. She has no wheezes. She has no rales.  Breast: deferred to Gyn Abdominal: Soft. She exhibits no distension. There is no tenderness.  Lymphadenopathy: She has no cervical adenopathy.  Skin: Skin is warm and dry. She is not diaphoretic.  Psychiatric: She has a normal mood and affect. Her behavior is normal.        Assessment & Plan:   Wellness Exam: Immunizations pneumovax today, shingles discussed Colonoscopy  Up to date  -  Due 04/2011 Mammogram   Up to date  Dexa  Up to date  49  - Up to date - Dr Ophelia Charter Eye exam  Up to date  Hearing loss  none Memory concerns/difficulties  None, beyond what is expected for her age - recall Independent of ADLs -- fully Stressed the importance of regular exercise   Patient received copy of preventative screening tests/immunizations recommended for the next 5-10 years.   See Problem List for Assessment and Plan of chronic medical problems.   F/u annually - wellness and follow up

## 2015-12-23 NOTE — Assessment & Plan Note (Signed)
Check lipid panel  Continue daily statin Regular exercise and healthy diet encouraged  

## 2015-12-23 NOTE — Assessment & Plan Note (Signed)
Intermittent, no radiculopathy Will let me know if she wants a referral for further evaluation

## 2015-12-23 NOTE — Patient Instructions (Addendum)
  Sydney Robinson , Thank you for taking time to come for your Medicare Wellness Visit. I appreciate your ongoing commitment to your health goals. Please review the following plan we discussed and let me know if I can assist you in the future.   These are the goals we discussed: Goals    . Exercise 150 minutes per week (moderate activity)            This is a list of the screening recommended for you and due dates:  Health Maintenance  Topic Date Due  . Shingles Vaccine  01/13/2007  . Pneumonia vaccines (2 of 2 - PPSV23) 12/14/2015  . Colon Cancer Screening  05/02/2016  . DEXA scan (bone density measurement)  02/09/2017  . Mammogram  07/03/2017  . Tetanus Vaccine  07/30/2018  . Flu Shot  Completed  .  Hepatitis C: One time screening is recommended by Center for Disease Control  (CDC) for  adults born from [birth year] through 1963.   Completed   Test(s) ordered today. Your results will be released to [place] (or called to you) after review, usually within 72hours after test completion. If any changes need to be made, you will be notified at that same time.  All other Health Maintenance issues reviewed.   All recommended immunizations and age-appropriate screenings are up-to-date or discussed.  Pneumovax pneumonia vaccine administered today.   Medications reviewed and updated.  No changes recommended at this time.  Your prescription(s) have been submitted to your pharmacy. Please take as directed and contact our office if you believe you are having problem(s) with the medication(s).   Please followup in one year

## 2015-12-23 NOTE — Assessment & Plan Note (Signed)
Exercising Advised weight loss Check a1c

## 2015-12-23 NOTE — Assessment & Plan Note (Signed)
BP well controlled Current regimen effective and well tolerated Continue current medications at current doses Cmp, tsh, cbc.lipid

## 2015-12-28 ENCOUNTER — Other Ambulatory Visit (INDEPENDENT_AMBULATORY_CARE_PROVIDER_SITE_OTHER): Payer: Medicare Other

## 2015-12-28 DIAGNOSIS — R7301 Impaired fasting glucose: Secondary | ICD-10-CM | POA: Diagnosis not present

## 2015-12-28 DIAGNOSIS — E7800 Pure hypercholesterolemia, unspecified: Secondary | ICD-10-CM | POA: Diagnosis not present

## 2015-12-28 DIAGNOSIS — I10 Essential (primary) hypertension: Secondary | ICD-10-CM

## 2015-12-28 LAB — CBC WITH DIFFERENTIAL/PLATELET
BASOS PCT: 0.4 % (ref 0.0–3.0)
Basophils Absolute: 0 10*3/uL (ref 0.0–0.1)
EOS ABS: 0.1 10*3/uL (ref 0.0–0.7)
EOS PCT: 1.5 % (ref 0.0–5.0)
HEMATOCRIT: 39.7 % (ref 36.0–46.0)
HEMOGLOBIN: 13.6 g/dL (ref 12.0–15.0)
LYMPHS PCT: 40.7 % (ref 12.0–46.0)
Lymphs Abs: 2.1 10*3/uL (ref 0.7–4.0)
MCHC: 34.3 g/dL (ref 30.0–36.0)
MCV: 84.7 fl (ref 78.0–100.0)
Monocytes Absolute: 0.5 10*3/uL (ref 0.1–1.0)
Monocytes Relative: 9.4 % (ref 3.0–12.0)
NEUTROS ABS: 2.5 10*3/uL (ref 1.4–7.7)
Neutrophils Relative %: 48 % (ref 43.0–77.0)
PLATELETS: 220 10*3/uL (ref 150.0–400.0)
RBC: 4.68 Mil/uL (ref 3.87–5.11)
RDW: 13.9 % (ref 11.5–15.5)
WBC: 5.3 10*3/uL (ref 4.0–10.5)

## 2015-12-28 LAB — COMPREHENSIVE METABOLIC PANEL
ALT: 10 U/L (ref 0–35)
AST: 14 U/L (ref 0–37)
Albumin: 4.5 g/dL (ref 3.5–5.2)
Alkaline Phosphatase: 52 U/L (ref 39–117)
BILIRUBIN TOTAL: 1 mg/dL (ref 0.2–1.2)
BUN: 18 mg/dL (ref 6–23)
CO2: 31 meq/L (ref 19–32)
Calcium: 9.6 mg/dL (ref 8.4–10.5)
Chloride: 105 mEq/L (ref 96–112)
Creatinine, Ser: 1.13 mg/dL (ref 0.40–1.20)
GFR: 61.41 mL/min (ref >60.00)
GLUCOSE: 108 mg/dL — AB (ref 70–99)
Potassium: 4.2 mEq/L (ref 3.5–5.1)
SODIUM: 143 meq/L (ref 135–145)
Total Protein: 7 g/dL (ref 6.0–8.3)

## 2015-12-28 LAB — LIPID PANEL
CHOL/HDL RATIO: 3
Cholesterol: 204 mg/dL — ABNORMAL HIGH (ref 0–200)
HDL: 71.1 mg/dL (ref >39.00)
LDL CALC: 114 mg/dL — AB (ref 0–99)
NONHDL: 132.77
TRIGLYCERIDES: 96 mg/dL (ref 0.0–149.0)
VLDL: 19.2 mg/dL (ref 0.0–40.0)

## 2015-12-28 LAB — TSH: TSH: 2.07 u[IU]/mL (ref 0.35–4.50)

## 2015-12-28 LAB — HEMOGLOBIN A1C: Hgb A1c MFr Bld: 5.5 % (ref 4.6–6.5)

## 2015-12-29 ENCOUNTER — Encounter: Payer: Self-pay | Admitting: Internal Medicine

## 2016-01-08 ENCOUNTER — Other Ambulatory Visit: Payer: Self-pay | Admitting: Internal Medicine

## 2016-01-18 DIAGNOSIS — Z6832 Body mass index (BMI) 32.0-32.9, adult: Secondary | ICD-10-CM | POA: Diagnosis not present

## 2016-01-18 DIAGNOSIS — Z01419 Encounter for gynecological examination (general) (routine) without abnormal findings: Secondary | ICD-10-CM | POA: Diagnosis not present

## 2016-02-04 ENCOUNTER — Other Ambulatory Visit: Payer: Self-pay | Admitting: Internal Medicine

## 2016-02-04 DIAGNOSIS — R1013 Epigastric pain: Secondary | ICD-10-CM

## 2016-02-08 ENCOUNTER — Other Ambulatory Visit: Payer: Self-pay | Admitting: Internal Medicine

## 2016-03-18 ENCOUNTER — Other Ambulatory Visit: Payer: Self-pay | Admitting: Internal Medicine

## 2016-04-30 DIAGNOSIS — L309 Dermatitis, unspecified: Secondary | ICD-10-CM | POA: Diagnosis not present

## 2016-04-30 DIAGNOSIS — L918 Other hypertrophic disorders of the skin: Secondary | ICD-10-CM | POA: Diagnosis not present

## 2016-04-30 DIAGNOSIS — D2230 Melanocytic nevi of unspecified part of face: Secondary | ICD-10-CM | POA: Diagnosis not present

## 2016-04-30 DIAGNOSIS — L821 Other seborrheic keratosis: Secondary | ICD-10-CM | POA: Diagnosis not present

## 2016-04-30 DIAGNOSIS — L723 Sebaceous cyst: Secondary | ICD-10-CM | POA: Diagnosis not present

## 2016-04-30 DIAGNOSIS — D1800 Hemangioma unspecified site: Secondary | ICD-10-CM | POA: Diagnosis not present

## 2016-05-12 ENCOUNTER — Ambulatory Visit (HOSPITAL_COMMUNITY)
Admission: EM | Admit: 2016-05-12 | Discharge: 2016-05-12 | Disposition: A | Discharge status: Home / Self Care | Payer: Medicare Other | Attending: Internal Medicine | Admitting: Internal Medicine

## 2016-05-12 ENCOUNTER — Encounter (HOSPITAL_COMMUNITY): Payer: Self-pay | Admitting: Emergency Medicine

## 2016-05-12 DIAGNOSIS — R69 Illness, unspecified: Secondary | ICD-10-CM

## 2016-05-12 DIAGNOSIS — R531 Weakness: Secondary | ICD-10-CM

## 2016-05-12 DIAGNOSIS — R05 Cough: Secondary | ICD-10-CM | POA: Diagnosis not present

## 2016-05-12 DIAGNOSIS — R0981 Nasal congestion: Secondary | ICD-10-CM

## 2016-05-12 DIAGNOSIS — J111 Influenza due to unidentified influenza virus with other respiratory manifestations: Secondary | ICD-10-CM

## 2016-05-12 LAB — POCT URINALYSIS DIP (DEVICE)
Bilirubin Urine: NEGATIVE
GLUCOSE, UA: NEGATIVE mg/dL
KETONES UR: NEGATIVE mg/dL
LEUKOCYTES UA: NEGATIVE
Nitrite: NEGATIVE
PROTEIN: NEGATIVE mg/dL
SPECIFIC GRAVITY, URINE: 1.025 (ref 1.005–1.030)
UROBILINOGEN UA: 0.2 mg/dL (ref 0.0–1.0)
pH: 5.5 (ref 5.0–8.0)

## 2016-05-12 MED ORDER — ACETAMINOPHEN 325 MG PO TABS
ORAL_TABLET | ORAL | Status: AC
  Filled 2016-05-12: qty 2

## 2016-05-12 MED ORDER — ACETAMINOPHEN 325 MG PO TABS
650.0000 mg | ORAL_TABLET | Freq: Once | ORAL | Status: AC
  Administered 2016-05-12: 650 mg via ORAL

## 2016-05-12 MED ORDER — OSELTAMIVIR PHOSPHATE 75 MG PO CAPS: 75.0000 mg | ORAL_CAPSULE | Freq: Two times a day (BID) | ORAL | 0 refills | Status: AC

## 2016-05-12 NOTE — ED Triage Notes (Signed)
Fever onset yesterday and today.  Patient is incontinent.    Bites to right thigh  Patient went to [place] a week ago

## 2016-05-12 NOTE — ED Provider Notes (Signed)
CSN: [id number]     Arrival date & time 05/12/16  1911 History   First MD Initiated Contact with Patient 05/12/16 2048     Chief Complaint  Patient presents with  . Fever   (Consider location/radiation/quality/duration/timing/severity/associated sxs/prior Treatment) Patient presents today with flu-like symptoms. Symptoms started suddenly yesterday with coughing, running nose, congestion, fever, chills, body aches, generalized weakness. She had some sore throat and headache but both resolved currently. She has no wheezing or SOB. She also has no abdominal pain or chest pain. Fever highest at 104.0 at home. Current temp is 103.5. Patient denies numbness or tingling sensation. Daughter is present in room and states patient's mental status is intact. She have been in and out of the hospital visiting family members.  Also states that she got bite some insect 1-2 weeks ago on her right thigh; this area is itchy.       Past Medical History:  Diagnosis Date  . 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 Diverticulosis   . Hyperlipidemia   . Hypertension   . Murmur    Past Surgical History:  Procedure Laterality Date  . COLONOSCOPY W/ POLYPECTOMY  1998    negative 2003 & 2008; 2013  . DILATION AND CURETTAGE OF UTERUS     x 3  . fibroidectomy     x3  . g2 p2    . hemorrhoidal banding  2011   Dr Earlean Shawl  . Neuroma Resected Foot     Right   . OVARIAN CYST REMOVAL    . ROTATOR CUFF REPAIR    . TONSILLECTOMY     Family History  Problem Relation Age of Onset  . Diabetes Mother   . Alzheimer's disease Mother   . Heart failure Mother   . Heart attack Maternal Aunt 59  . Stroke Paternal Aunt 67    cns aneurysm  . Uterine cancer Paternal Aunt   . Breast cancer Sister     2 sisters   . Diverticulitis Sister   . Diabetes Maternal Grandmother   . Diabetes Other     MGGM  . Colon cancer Other     cousin  . Diabetes Maternal Aunt   . Diabetes Maternal Uncle   . Diabetes Paternal Aunt     Social History  Substance Use Topics  . Smoking status: Never Smoker  . Smokeless tobacco: Never Used  . Alcohol use No     Comment: Wine rarely   OB History    No data available     Review of Systems  Constitutional: Positive for appetite change, chills, fatigue and fever.  HENT: Positive for congestion, rhinorrhea and sore throat.   Respiratory: Positive for cough. Negative for shortness of breath.   Cardiovascular: Negative for chest pain.  Gastrointestinal: Negative for abdominal pain and nausea.  Neurological: Positive for weakness and headaches. Negative for dizziness.    Allergies  Patient has no known allergies.  Home Medications   Prior to Admission medications   Medication Sig Start Date End Date Taking? Authorizing Provider  Cetirizine HCl (ZYRTEC PO) Take 1 tablet by mouth daily.    [provider]  clonazePAM (KLONOPIN) 0.5 MG tablet TAKE 1/2-1 TABLET AT BEDTIME AS NEEDED 06/22/15   [name], MD  hydrochlorothiazide (MICROZIDE) 12.5 MG capsule Take 1 capsule (12.5 mg total) by mouth daily. 01/10/16   [name], MD  hydrochlorothiazide (MICROZIDE) 12.5 MG capsule TAKE 1 CAPSULE EVERY DAY 02/09/16   Burns, Claudina Lick,  MD  labetalol (NORMODYNE) 300 MG tablet Take 0.5 tablets (150 mg total) by mouth 2 (two) times daily. 12/23/15   [name], MD  oseltamivir (TAMIFLU) 75 MG capsule Take 1 capsule (75 mg total) by mouth every 12 (twelve) hours. 05/12/16 05/17/16  Barry Dienes, NP  ranitidine (ZANTAC) 150 MG tablet TAKE 1 TABLET TWICE A DAY 02/06/16   [name], MD  simvastatin (ZOCOR) 40 MG tablet TAKE 1 TABLET (40 MG TOTAL) BY MOUTH AT BEDTIME. 03/19/16   Burns, Claudina Lick, MD   Meds Ordered and Administered this Visit   Medications  acetaminophen (TYLENOL) tablet 650 mg (650 mg Oral Given 05/12/16 2025)    BP 111/77 (BP Location: Right Arm)   Pulse (!) 104   Temp (!) 103.5 F (39.7 C) (Oral)   Resp (!) 24   SpO2 97%  No data found.   Physical  Exam  Constitutional: She is oriented to person, place, and time. She appears well-developed and well-nourished.  HENT:  Head: Normocephalic and atraumatic.  Right Ear: External ear normal.  Left Ear: External ear normal.  Nose: Nose normal.  Mouth/Throat: Oropharynx is clear and moist. No oropharyngeal exudate.  TM pearly gray with no erythema  Eyes: Conjunctivae are normal. Pupils are equal, round, and reactive to light.  Neck: Normal range of motion. Neck supple.  Cardiovascular: Normal rate, regular rhythm and normal heart sounds.   Pulmonary/Chest: Effort normal and breath sounds normal.  Abdominal: Soft. Bowel sounds are normal. She exhibits no distension. There is no tenderness.  Neurological: She is alert and oriented to person, place, and time. No cranial nerve deficit or sensory deficit. Coordination normal.  Skin: Skin is warm and dry.  Three erythematous distinct papule rash on right thigh.   Nursing note and vitals reviewed.   Urgent Care Course     Procedures (including critical care time)  Labs Review Labs Reviewed  POCT URINALYSIS DIP (DEVICE) - Abnormal; Notable for the following:       Result Value   Hgb urine dipstick SMALL (*)    All other components within normal limits    Imaging Review No results found.  MDM   1. Influenza-like illness    Physical examination unremarkable. No red flags. She lives with husband at home. Temp is coming down. Temp at discharge is 101.5. Will treat for influenza today with tamiflu BID x 5 days. Continue with tylenol and/or ibuprofen for fever. May do robitussin or delsym for cough. A lot of rest and hydration. Return precaution discussed. May apply OTC hydrocortisone cream for the insect bite. Doubt the insect bite and the current symptoms are related.     Barry Dienes, NP 05/12/16 2124

## 2016-05-14 ENCOUNTER — Encounter: Payer: Self-pay | Admitting: Internal Medicine

## 2016-05-14 ENCOUNTER — Ambulatory Visit (INDEPENDENT_AMBULATORY_CARE_PROVIDER_SITE_OTHER): Payer: Medicare Other | Admitting: Internal Medicine

## 2016-05-14 DIAGNOSIS — R69 Illness, unspecified: Secondary | ICD-10-CM

## 2016-05-14 DIAGNOSIS — J111 Influenza due to unidentified influenza virus with other respiratory manifestations: Secondary | ICD-10-CM

## 2016-05-14 MED ORDER — BENZONATATE 200 MG PO CAPS: 200.0000 mg | ORAL_CAPSULE | Freq: Three times a day (TID) | ORAL | 0 refills | Status: DC | PRN

## 2016-05-14 NOTE — Progress Notes (Signed)
   Subjective:    Patient ID: Sydney Robinson, female    DOB: 13-Nov-1945, 71 y.o.   MRN: [id number]  HPI The patient is a 71 YO female coming in for urgent care follow up. She was seen there over the weekend for symptoms starting Friday including headaches, chills, body aches. She was diagnosed with flu and given tamiflu, no flu testing done. She is taking this and starting to feel better today. Still with nose drainage and mild cough. She is not having SOB although she is fatigued. Some of the body aches are gone. She denies fevers or chills since the weekend. She is not taking any otc meds right now.   Review of Systems  Constitutional: Positive for activity change and fatigue. Negative for appetite change, chills, fever and unexpected weight change.  HENT: Positive for congestion, postnasal drip and rhinorrhea. Negative for dental problem, drooling, ear discharge, ear pain, sinus pain, sinus pressure, sore throat and trouble swallowing.   Eyes: Negative.   Respiratory: Positive for cough. Negative for chest tightness, shortness of breath and wheezing.   Cardiovascular: Negative.   Gastrointestinal: Negative.   Musculoskeletal: Negative.   Neurological: Negative.       Objective:   Physical Exam  Constitutional: She appears well-developed and well-nourished.  HENT:  Head: Normocephalic and atraumatic.  Oropharynx with redness and clear drainage, nose without crusting.   Eyes: EOM are normal.  Neck: Normal range of motion.  Cardiovascular: Normal rate and regular rhythm.   Pulmonary/Chest: Effort normal and breath sounds normal. No respiratory distress. She has no wheezes. She has no rales.  Abdominal: Soft. She exhibits no distension. There is no tenderness. There is no rebound.  Skin: Skin is warm and dry.   Vitals:   05/14/16 1558  BP: 126/80  Pulse: 84  Resp: 12  Temp: 98.3 F (36.8 C)  TempSrc: Oral  SpO2: 98%  Weight: 202 lb (91.6 kg)  Height: 5\' 8"  (1.727 m)        Assessment & Plan:

## 2016-05-14 NOTE — Patient Instructions (Signed)
I would keep taking the tamiflu until it is gone.   I would keep taking zyrtec (cetirizine) daily until you feel well.  We have sent in tesslon perles for cough which you can take up to 3 times per day for cough as needed.

## 2016-05-14 NOTE — Progress Notes (Signed)
Pre visit review using our clinic review tool, if applicable. No additional management support is needed unless otherwise documented below in the visit note. 

## 2016-05-15 DIAGNOSIS — J111 Influenza due to unidentified influenza virus with other respiratory manifestations: Secondary | ICD-10-CM | POA: Insufficient documentation

## 2016-05-15 DIAGNOSIS — R69 Illness, unspecified: Principal | ICD-10-CM

## 2016-05-15 NOTE — Assessment & Plan Note (Signed)
Advised to continue tamiflu. Encouraged her that this is likely viral and could continue for another 4-5 days and coughing for another 3-4 weeks. Given rx for tessalon perles for cough. Otherwise conservative measures for symptom relief discussed.

## 2016-06-07 DIAGNOSIS — K648 Other hemorrhoids: Secondary | ICD-10-CM | POA: Diagnosis not present

## 2016-06-07 DIAGNOSIS — Z86010 Personal history of colonic polyps: Secondary | ICD-10-CM | POA: Diagnosis not present

## 2016-06-07 DIAGNOSIS — Z1211 Encounter for screening for malignant neoplasm of colon: Secondary | ICD-10-CM | POA: Diagnosis not present

## 2016-06-07 LAB — HM COLONOSCOPY

## 2016-06-14 ENCOUNTER — Encounter: Payer: Self-pay | Admitting: Internal Medicine

## 2016-07-04 ENCOUNTER — Other Ambulatory Visit: Payer: Self-pay | Admitting: Internal Medicine

## 2016-07-04 DIAGNOSIS — K648 Other hemorrhoids: Secondary | ICD-10-CM | POA: Diagnosis not present

## 2016-07-04 DIAGNOSIS — Z1231 Encounter for screening mammogram for malignant neoplasm of breast: Secondary | ICD-10-CM

## 2016-07-04 DIAGNOSIS — E669 Obesity, unspecified: Secondary | ICD-10-CM | POA: Insufficient documentation

## 2016-07-18 DIAGNOSIS — K648 Other hemorrhoids: Secondary | ICD-10-CM | POA: Diagnosis not present

## 2016-07-24 ENCOUNTER — Ambulatory Visit
Admission: RE | Admit: 2016-07-24 | Discharge: 2016-07-24 | Disposition: A | Discharge status: Home / Self Care | Payer: Medicare Other | Source: Ambulatory Visit | Attending: Internal Medicine | Admitting: Internal Medicine

## 2016-07-24 DIAGNOSIS — Z1231 Encounter for screening mammogram for malignant neoplasm of breast: Secondary | ICD-10-CM | POA: Diagnosis not present

## 2016-08-22 DIAGNOSIS — K648 Other hemorrhoids: Secondary | ICD-10-CM | POA: Diagnosis not present

## 2016-10-08 DIAGNOSIS — Z23 Encounter for immunization: Secondary | ICD-10-CM | POA: Diagnosis not present

## 2016-11-02 ENCOUNTER — Other Ambulatory Visit: Payer: Self-pay | Admitting: Internal Medicine

## 2016-12-21 ENCOUNTER — Other Ambulatory Visit: Payer: Self-pay | Admitting: Internal Medicine

## 2016-12-24 DIAGNOSIS — G4700 Insomnia, unspecified: Secondary | ICD-10-CM | POA: Insufficient documentation

## 2016-12-24 DIAGNOSIS — K219 Gastro-esophageal reflux disease without esophagitis: Secondary | ICD-10-CM | POA: Insufficient documentation

## 2016-12-24 NOTE — Progress Notes (Signed)
Subjective:    Patient ID: DELPHA Robinson, female    DOB: 01/01/1946, 71 y.o.   MRN: [id number]  HPI  Here for medicare wellness exam and follow up of her chronic medical problems  I have personally reviewed and have noted 1.The patient's medical and social history 2.Their use of alcohol, tobacco or illicit drugs 3.Their current medications and supplements 4.The patient's functional ability including ADL's, fall risks, home                 safety risk and hearing or visual impairment. 5.Diet and physical activities 6.Evidence for depression or mood disorders 7.Care team reviewed  -  GI - Dr Sydney Robinson, GYn - Dr Sydney Robinson, eye  Dr.   Right groin pain:  She has intermittent right groin pain.  It is worse when she is not exercising.  She does have an ache sometimes that radiates down to her knee.  She was thinking about going and seen an orthopedic.  Hypertension: She is taking her medication daily. She is compliant with a low sodium diet.  She denies chest pain, palpitations, edema, shortness of breath and regular headaches. She is exercising irregularly.  She knows she needs to start exercising more frequently and regularly.  She does not monitor her blood pressure at home.    Hyperlipidemia: She is taking her medication daily. She is compliant with a low fat/cholesterol diet. She is exercising irregularly. She denies myalgias.   GERD:  She is taking her medication as needed only.  She takes it about once a week .  She denies daily GERD symptoms and feels her GERD is well controlled.   Hyperglycemia:  She is not compliant with a low sugar/carbohydrate diet.  She is exercising irregularly.  Insomnia:  She is taking clonazepam as needed.  She takes it maybe once or twice a week, but only takes half a pill at a time.  She denies side effects.  Are there smokers in your home (other than you)? No  Risk Factors Exercise:   Exercise irregularly, trying to exercise more regularly Dietary issues discussed: Eating too many sugars.  Her husband buys them and she is not able to resist once they are in the house.  She does eat plenty of fruits and vegetables and overall eats a well-rounded  Vitamin and supplement use: Prenatal MVI about twice a week, benefiber  Opiod use: None   Side effects from medication:   Not applicable Does medications benefits outweigh risks/side effects:   Not applicable  Cardiac risk factors: advanced age, hypertension, hyperlipidemia, and obesity (BMI >= 30 kg/m2).  Depression Screen  Have you felt down, depressed or hopeless? No  Have you felt little interest or pleasure in doing things?  No  Activities of Daily Living In your present state of health, do you have any difficulty performing the following activities?:  Driving? No Managing money?  No Feeding yourself? No Getting from bed to chair? No Climbing a flight of stairs? No Preparing food and eating?: No Bathing or showering? No Getting dressed: No Getting to/using the toilet? No Moving around from place to place: No In the past year have you fallen or had a near fall?: No   Are you sexually active?  No  Do you have more than one partner?  No   Hearing Difficulties: No Do you often ask people to speak up or repeat themselves? No Do you experience ringing or noises in your ears? No Do you have difficulty  understanding soft or whispered voices? No Vision:              Any change in vision: He has subtle changes-possibly age-related             Up to date with eye exam:  No - will scheudle  Memory:  Do you feel that you have a problem with memory? No, except name recall.  She is not concerned regarding this and feels this is related to the normal aging process  Do you often misplace items? No  Do you feel safe at home?  Yes  Cognitive Testing  Alert, Orientated? Yes  Normal Appearance? Yes  Recall of three objects?   Yes  Can perform simple calculations? Yes  Displays appropriate judgment? Yes  Can read the correct time from a watch face? Yes   Advanced Directives have been discussed with the patient? Yes     Medications and allergies reviewed with patient and updated if appropriate.  Patient Active Problem List   Diagnosis Date Noted  . GERD (gastroesophageal reflux disease) 12/24/2016  . Insomnia 12/24/2016  . Obesity 07/04/2016  . Internal hemorrhoids 07/04/2016  . Lower back pain 12/23/2015  . Fasting hyperglycemia 11/04/2012  . RAD (reactive airway disease) 12/03/2011  . DIVERTICULOSIS, COLON 08/12/2009  . Menopausal and postmenopausal disorder 08/12/2009  . SYMPTOM, MURMUR, CARDIAC, UNDIAGNOSED 09/03/2006  . Hyperlipidemia 06/04/2006  . Essential hypertension 06/04/2006  . History of colonic polyps 06/04/2006    Current Outpatient Medications on File Prior to Visit  Medication Sig Dispense Refill  . Cetirizine HCl (ZYRTEC PO) Take 1 tablet by mouth daily.    . clonazePAM (KLONOPIN) 0.5 MG tablet TAKE 1/2-1 TABLET AT BEDTIME AS NEEDED 30 tablet 2  . hydrochlorothiazide (MICROZIDE) 12.5 MG capsule TAKE 1 CAPSULE EVERY DAY 90 capsule 3  . labetalol (NORMODYNE) 300 MG tablet TAKE 0.5 TABLETS (150 MG TOTAL) BY MOUTH 2 (TWO) TIMES DAILY. 30 tablet 0  . ranitidine (ZANTAC) 150 MG tablet TAKE 1 TABLET TWICE A DAY 180 tablet 2  . simvastatin (ZOCOR) 40 MG tablet TAKE 1 TABLET (40 MG TOTAL) BY MOUTH AT BEDTIME. 90 tablet 2   No current facility-administered medications on file prior to visit.     Past Medical History:  Diagnosis Date  . 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 Diverticulosis   . Hyperlipidemia   . Hypertension   . Murmur     Past Surgical History:  Procedure Laterality Date  . COLONOSCOPY W/ POLYPECTOMY  1998    negative 2003 & 2008; 2013  . DILATION AND CURETTAGE OF UTERUS     x 3  . fibroidectomy     x3  . g2 p2    . hemorrhoidal banding  2011   Dr Sydney Robinson  .  Neuroma Resected Foot     Right   . OVARIAN CYST REMOVAL    . ROTATOR CUFF REPAIR    . TONSILLECTOMY      Social History   Socioeconomic History  . Marital status: Married    Spouse name: None  . Number of children: None  . Years of education: None  . Highest education level: None  Social Needs  . Financial resource strain: None  . Food insecurity - worry: None  . Food insecurity - inability: None  . Transportation needs - medical: None  . Transportation needs - non-medical: None  Occupational History  . None  Tobacco Use  . Smoking status: Never Smoker  .  Smokeless tobacco: Never Used  Substance and Sexual Activity  . Alcohol use: No    Alcohol/week: 0.0 oz    Comment: Wine rarely  . Drug use: No  . Sexual activity: None  Other Topics Concern  . None  Social History Narrative   Exercise: walking 2-3 times a week    Family History  Problem Relation Age of Onset  . Diabetes Mother   . Alzheimer's disease Mother   . Heart failure Mother   . Heart attack Maternal Aunt 59  . Stroke Paternal Aunt 49       cns aneurysm  . Uterine cancer Paternal Aunt   . Breast cancer Sister        2 sisters   . Diverticulitis Sister   . Diabetes Maternal Grandmother   . Diabetes Other        MGGM  . Colon cancer Other        cousin  . Diabetes Maternal Aunt   . Diabetes Maternal Uncle   . Diabetes Paternal Aunt     Review of Systems  Constitutional: Negative for chills and fever.  HENT: Negative for hearing loss and tinnitus.   Eyes: Positive for visual disturbance (minor changes).  Respiratory: Negative for cough, shortness of breath and wheezing.   Cardiovascular: Negative for chest pain, palpitations and leg swelling.  Neurological: Positive for light-headedness (rare). Negative for headaches.  Psychiatric/Behavioral: Negative for dysphoric mood. The patient is not nervous/anxious.        Objective:   Vitals:   12/25/16 1600  BP: 118/82  Pulse: 77  Resp: 16    Temp: 98 F (36.7 C)  SpO2: 98%   Wt Readings from Last 3 Encounters:  12/25/16 208 lb (94.3 kg)  05/14/16 202 lb (91.6 kg)  12/23/15 207 lb (93.9 kg)   Body mass index is 31.63 kg/m.   Physical Exam    Constitutional: Appears well-developed and well-nourished. No distress.  HENT:  Head: Normocephalic and atraumatic.  Neck: Neck supple. No tracheal deviation present. No thyromegaly present.  No cervical lymphadenopathy Cardiovascular: Normal rate, regular rhythm and normal heart sounds.   No murmur heard. No carotid bruit .  No edema Pulmonary/Chest: Effort normal and breath sounds normal. No respiratory distress. No has no wheezes. No rales.  Skin: Skin is warm and dry. Not diaphoretic.  Psychiatric: Normal mood and affect. Behavior is normal.      Assessment & Plan:    Wellness Exam: Immunizations discussed shingles vaccine, others up-to-date Colonoscopy up-to-date Mammogram up-to-date Dexa    up-to-date Gyn up-to-date Eye exam   has detected mild changes in vision-due for eye exam, will schedule Hearing loss    none Memory concerns/difficulties    some age-related changes, no concerning changes Independent of ADLs    fully independent Stressed the importance of regular exercise   Patient received copy of preventative screening tests/immunizations recommended for the next 5-10 years.  See Problem List for Assessment and Plan of chronic medical problems.  Follow-up in 1 year

## 2016-12-25 ENCOUNTER — Encounter: Payer: Self-pay | Admitting: Internal Medicine

## 2016-12-25 ENCOUNTER — Ambulatory Visit (INDEPENDENT_AMBULATORY_CARE_PROVIDER_SITE_OTHER): Payer: Medicare Other | Admitting: Internal Medicine

## 2016-12-25 VITALS — BP 118/82 | HR 77 | Temp 98.0°F | Resp 16 | Wt 208.0 lb

## 2016-12-25 DIAGNOSIS — I10 Essential (primary) hypertension: Secondary | ICD-10-CM | POA: Diagnosis not present

## 2016-12-25 DIAGNOSIS — E7849 Other hyperlipidemia: Secondary | ICD-10-CM | POA: Diagnosis not present

## 2016-12-25 DIAGNOSIS — G4700 Insomnia, unspecified: Secondary | ICD-10-CM | POA: Diagnosis not present

## 2016-12-25 DIAGNOSIS — K219 Gastro-esophageal reflux disease without esophagitis: Secondary | ICD-10-CM

## 2016-12-25 DIAGNOSIS — Z0000 Encounter for general adult medical examination without abnormal findings: Secondary | ICD-10-CM

## 2016-12-25 DIAGNOSIS — R7301 Impaired fasting glucose: Secondary | ICD-10-CM

## 2016-12-25 MED ORDER — SIMVASTATIN 40 MG PO TABS: ORAL_TABLET | ORAL | 3 refills | Status: DC

## 2016-12-25 MED ORDER — CLONAZEPAM 0.5 MG PO TABS: ORAL_TABLET | ORAL | 2 refills | Status: DC

## 2016-12-25 MED ORDER — RANITIDINE HCL 150 MG PO TABS: 150.0000 mg | ORAL_TABLET | Freq: Two times a day (BID) | ORAL | 3 refills | Status: DC

## 2016-12-25 MED ORDER — LABETALOL HCL 300 MG PO TABS: 150.0000 mg | ORAL_TABLET | Freq: Two times a day (BID) | ORAL | 3 refills | Status: DC

## 2016-12-25 MED ORDER — HYDROCHLOROTHIAZIDE 12.5 MG PO CAPS: 12.5000 mg | ORAL_CAPSULE | Freq: Every day | ORAL | 3 refills | Status: DC

## 2016-12-25 NOTE — Assessment & Plan Note (Signed)
Takes zantac prn only Takes it about once a week

## 2016-12-25 NOTE — Assessment & Plan Note (Signed)
Check a1c Low sugar / carb diet Stressed regular exercise, weight loss  

## 2016-12-25 NOTE — Patient Instructions (Addendum)
  Ms. Tilmon , Thank you for taking time to come for your Medicare Wellness Visit. I appreciate your ongoing commitment to your health goals. Please review the following plan we discussed and let me know if I can assist you in the future.   These are the goals we discussed: Goals    . Exercise regularly       This is a list of the screening recommended for you and due dates:  Health Maintenance  Topic Date Due  . DEXA scan (bone density measurement)  02/09/2017  . Mammogram  07/25/2018  . Tetanus Vaccine  07/30/2018  . Colon Cancer Screening  06/07/2021  . Flu Shot  Completed  .  Hepatitis C: One time screening is recommended by Center for Disease Control  (CDC) for  adults born from [birth year] through 1963.   Completed  . Pneumonia vaccines  Completed     Test(s) ordered today. Your results will be released to [place] (or called to you) after review, usually within 72hours after test completion. If any changes need to be made, you will be notified at that same time.  All other Health Maintenance issues reviewed.   All recommended immunizations and age-appropriate screenings are up-to-date or discussed.  No immunizations administered today.   Medications reviewed and updated. No changes recommended at this time.  Your prescription(s) have been submitted to your pharmacy. Please take as directed and contact our office if you believe you are having problem(s) with the medication(s).  Please followup in one year

## 2016-12-25 NOTE — Assessment & Plan Note (Addendum)
Takes medications 2-3 times a week Check lipid panel  Continue statin-we will determine with blood work if she needs to take it more regularly Regular exercise and healthy diet encouraged

## 2016-12-25 NOTE — Assessment & Plan Note (Signed)
BP well controlled Current regimen effective and well tolerated Continue current medications at current doses cmp  

## 2016-12-25 NOTE — Assessment & Plan Note (Signed)
Clonazepam only as needed Ok to continue

## 2016-12-26 ENCOUNTER — Other Ambulatory Visit (INDEPENDENT_AMBULATORY_CARE_PROVIDER_SITE_OTHER): Payer: Medicare Other

## 2016-12-26 DIAGNOSIS — I10 Essential (primary) hypertension: Secondary | ICD-10-CM

## 2016-12-26 DIAGNOSIS — E7849 Other hyperlipidemia: Secondary | ICD-10-CM | POA: Diagnosis not present

## 2016-12-26 DIAGNOSIS — R7301 Impaired fasting glucose: Secondary | ICD-10-CM | POA: Diagnosis not present

## 2016-12-26 LAB — LIPID PANEL
CHOLESTEROL: 254 mg/dL — AB (ref 0–200)
HDL: 65.3 mg/dL (ref >39.00)
LDL Cholesterol: 161 mg/dL — ABNORMAL HIGH (ref 0–99)
NonHDL: 188.31
TRIGLYCERIDES: 138 mg/dL (ref 0.0–149.0)
Total CHOL/HDL Ratio: 4
VLDL: 27.6 mg/dL (ref 0.0–40.0)

## 2016-12-26 LAB — CBC WITH DIFFERENTIAL/PLATELET
BASOS ABS: 0 10*3/uL (ref 0.0–0.1)
Basophils Relative: 0.7 % (ref 0.0–3.0)
EOS ABS: 0.1 10*3/uL (ref 0.0–0.7)
Eosinophils Relative: 1.1 % (ref 0.0–5.0)
HCT: 41.4 % (ref 36.0–46.0)
HEMOGLOBIN: 13.9 g/dL (ref 12.0–15.0)
LYMPHS ABS: 2.2 10*3/uL (ref 0.7–4.0)
Lymphocytes Relative: 45.7 % (ref 12.0–46.0)
MCHC: 33.6 g/dL (ref 30.0–36.0)
MCV: 86.5 fl (ref 78.0–100.0)
MONO ABS: 0.4 10*3/uL (ref 0.1–1.0)
Monocytes Relative: 9 % (ref 3.0–12.0)
NEUTROS PCT: 43.5 % (ref 43.0–77.0)
Neutro Abs: 2.1 10*3/uL (ref 1.4–7.7)
Platelets: 230 10*3/uL (ref 150.0–400.0)
RBC: 4.79 Mil/uL (ref 3.87–5.11)
RDW: 14.2 % (ref 11.5–15.5)
WBC: 4.8 10*3/uL (ref 4.0–10.5)

## 2016-12-26 LAB — TSH: TSH: 1.77 u[IU]/mL (ref 0.35–4.50)

## 2016-12-26 LAB — COMPREHENSIVE METABOLIC PANEL
ALBUMIN: 4.4 g/dL (ref 3.5–5.2)
ALK PHOS: 59 U/L (ref 39–117)
ALT: 8 U/L (ref 0–35)
AST: 12 U/L (ref 0–37)
BILIRUBIN TOTAL: 0.8 mg/dL (ref 0.2–1.2)
BUN: 11 mg/dL (ref 6–23)
CO2: 28 mEq/L (ref 19–32)
Calcium: 9.6 mg/dL (ref 8.4–10.5)
Chloride: 104 mEq/L (ref 96–112)
Creatinine, Ser: 1.02 mg/dL (ref 0.40–1.20)
GFR: 68.91 mL/min (ref >60.00)
GLUCOSE: 102 mg/dL — AB (ref 70–99)
POTASSIUM: 3.9 meq/L (ref 3.5–5.1)
SODIUM: 141 meq/L (ref 135–145)
TOTAL PROTEIN: 7.2 g/dL (ref 6.0–8.3)

## 2016-12-26 LAB — HEMOGLOBIN A1C: Hgb A1c MFr Bld: 5.7 % (ref 4.6–6.5)

## 2016-12-29 ENCOUNTER — Encounter: Payer: Self-pay | Admitting: Internal Medicine

## 2017-01-21 DIAGNOSIS — Z124 Encounter for screening for malignant neoplasm of cervix: Secondary | ICD-10-CM | POA: Diagnosis not present

## 2017-01-21 DIAGNOSIS — Z6833 Body mass index (BMI) 33.0-33.9, adult: Secondary | ICD-10-CM | POA: Diagnosis not present

## 2017-03-15 DIAGNOSIS — H2513 Age-related nuclear cataract, bilateral: Secondary | ICD-10-CM | POA: Diagnosis not present

## 2017-06-24 ENCOUNTER — Other Ambulatory Visit: Payer: Self-pay | Admitting: Internal Medicine

## 2017-06-24 DIAGNOSIS — Z1231 Encounter for screening mammogram for malignant neoplasm of breast: Secondary | ICD-10-CM

## 2017-07-26 ENCOUNTER — Ambulatory Visit
Admission: RE | Admit: 2017-07-26 | Discharge: 2017-07-26 | Disposition: A | Discharge status: Home / Self Care | Payer: Medicare PPO | Source: Ambulatory Visit | Attending: Internal Medicine | Admitting: Internal Medicine

## 2017-07-26 DIAGNOSIS — Z1231 Encounter for screening mammogram for malignant neoplasm of breast: Secondary | ICD-10-CM | POA: Diagnosis not present

## 2017-07-29 ENCOUNTER — Other Ambulatory Visit: Payer: Self-pay | Admitting: Internal Medicine

## 2017-07-29 DIAGNOSIS — R928 Other abnormal and inconclusive findings on diagnostic imaging of breast: Secondary | ICD-10-CM

## 2017-08-01 ENCOUNTER — Ambulatory Visit
Admission: RE | Admit: 2017-08-01 | Discharge: 2017-08-01 | Disposition: A | Discharge status: Home / Self Care | Payer: Medicare PPO | Source: Ambulatory Visit | Attending: Internal Medicine | Admitting: Internal Medicine

## 2017-08-01 DIAGNOSIS — N6489 Other specified disorders of breast: Secondary | ICD-10-CM | POA: Diagnosis not present

## 2017-08-01 DIAGNOSIS — R928 Other abnormal and inconclusive findings on diagnostic imaging of breast: Secondary | ICD-10-CM

## 2017-08-07 ENCOUNTER — Other Ambulatory Visit: Payer: Self-pay | Admitting: Surgery

## 2017-08-07 DIAGNOSIS — L720 Epidermal cyst: Secondary | ICD-10-CM | POA: Diagnosis not present

## 2017-11-19 ENCOUNTER — Encounter: Payer: Self-pay | Admitting: Internal Medicine

## 2017-11-19 NOTE — Telephone Encounter (Signed)
Documented flu shot.Marland KitchenJohny Chess

## 2017-12-15 NOTE — Patient Instructions (Addendum)
Tests ordered today. Your results will be released to [facility] (or called to you) after review, usually within 72hours after test completion. If any changes need to be made, you will be notified at that same time.  All other Health Maintenance issues reviewed.   All recommended immunizations and age-appropriate screenings are up-to-date or discussed.  No immunizations administered today.   Medications reviewed and updated.  Changes include :   none  Your prescription(s) have been submitted to your pharmacy. Please take as directed and contact our office if you believe you are having problem(s) with the medication(s).  A referral was ordered for orthopedics  Please followup in one year   Health Maintenance, Female Adopting a healthy lifestyle and getting preventive care can go a long way to promote health and wellness. Talk with your health care provider about what schedule of regular examinations is right for you. This is a good chance for you to check in with your provider about disease prevention and staying healthy. In between checkups, there are plenty of things you can do on your own. Experts have done a lot of research about which lifestyle changes and preventive measures are most likely to keep you healthy. Ask your health care provider for more information. Weight and diet Eat a healthy diet  Be sure to include plenty of vegetables, fruits, low-fat dairy products, and lean protein.  Do not eat a lot of foods high in solid fats, added sugars, or salt.  Get regular exercise. This is one of the most important things you can do for your health. ? Most adults should exercise for at least 150 minutes each week. The exercise should increase your heart rate and make you sweat (moderate-intensity exercise). ? Most adults should also do strengthening exercises at least twice a week. This is in addition to the moderate-intensity exercise.  Maintain a healthy weight  Body mass index (BMI)  is a measurement that can be used to identify possible weight problems. It estimates body fat based on height and weight. Your health care provider can help determine your BMI and help you achieve or maintain a healthy weight.  For females 47 years of age and older: ? A BMI below 18.5 is considered underweight. ? A BMI of 18.5 to 24.9 is normal. ? A BMI of 25 to 29.9 is considered overweight. ? A BMI of 30 and above is considered obese.  Watch levels of cholesterol and blood lipids  You should start having your blood tested for lipids and cholesterol at 70 years of age, then have this test every 5 years.  You may need to have your cholesterol levels checked more often if: ? Your lipid or cholesterol levels are high. ? You are older than 70 years of age. ? You are at high risk for heart disease.  Cancer screening Lung Cancer  Lung cancer screening is recommended for adults 59-77 years old who are at high risk for lung cancer because of a history of smoking.  A yearly low-dose CT scan of the lungs is recommended for people who: ? Currently smoke. ? Have quit within the past 15 years. ? Have at least a 30-pack-year history of smoking. A pack year is smoking an average of one pack of cigarettes a day for 1 year.  Yearly screening should continue until it has been 15 years since you quit.  Yearly screening should stop if you develop a health problem that would prevent you from having lung cancer treatment.  Breast Cancer  Practice breast self-awareness. This means understanding how your breasts normally appear and feel.  It also means doing regular breast self-exams. Let your health care provider know about any changes, no matter how small.  If you are in your 20s or 30s, you should have a clinical breast exam (CBE) by a health care provider every 1-3 years as part of a regular health exam.  If you are 44 or older, have a CBE every year. Also consider having a breast X-ray  (mammogram) every year.  If you have a family history of breast cancer, talk to your health care provider about genetic screening.  If you are at high risk for breast cancer, talk to your health care provider about having an MRI and a mammogram every year.  Breast cancer gene (BRCA) assessment is recommended for women who have family members with BRCA-related cancers. BRCA-related cancers include: ? Breast. ? Ovarian. ? Tubal. ? Peritoneal cancers.  Results of the assessment will determine the need for genetic counseling and BRCA1 and BRCA2 testing.  Cervical Cancer Your health care provider may recommend that you be screened regularly for cancer of the pelvic organs (ovaries, uterus, and vagina). This screening involves a pelvic examination, including checking for microscopic changes to the surface of your cervix (Pap test). You may be encouraged to have this screening done every 3 years, beginning at age 83.  For women ages 80-65, health care providers may recommend pelvic exams and Pap testing every 3 years, or they may recommend the Pap and pelvic exam, combined with testing for human papilloma virus (HPV), every 5 years. Some types of HPV increase your risk of cervical cancer. Testing for HPV may also be done on women of any age with unclear Pap test results.  Other health care providers may not recommend any screening for nonpregnant women who are considered low risk for pelvic cancer and who do not have symptoms. Ask your health care provider if a screening pelvic exam is right for you.  If you have had past treatment for cervical cancer or a condition that could lead to cancer, you need Pap tests and screening for cancer for at least 20 years after your treatment. If Pap tests have been discontinued, your risk factors (such as having a new sexual partner) need to be reassessed to determine if screening should resume. Some women have medical problems that increase the chance of getting  cervical cancer. In these cases, your health care provider may recommend more frequent screening and Pap tests.  Colorectal Cancer  This type of cancer can be detected and often prevented.  Routine colorectal cancer screening usually begins at 70 years of age and continues through 69 years of age.  Your health care provider may recommend screening at an earlier age if you have risk factors for colon cancer.  Your health care provider may also recommend using home test kits to check for hidden blood in the stool.  A small camera at the end of a tube can be used to examine your colon directly (sigmoidoscopy or colonoscopy). This is done to check for the earliest forms of colorectal cancer.  Routine screening usually begins at age 35.  Direct examination of the colon should be repeated every 5-10 years through 70 years of age. However, you may need to be screened more often if early forms of precancerous polyps or small growths are found.  Skin Cancer  Check your skin from head to toe regularly.  Tell  your health care provider about any new moles or changes in moles, especially if there is a change in a mole's shape or color.  Also tell your health care provider if you have a mole that is larger than the size of a pencil eraser.  Always use sunscreen. Apply sunscreen liberally and repeatedly throughout the day.  Protect yourself by wearing long sleeves, pants, a wide-brimmed hat, and sunglasses whenever you are outside.  Heart disease, diabetes, and high blood pressure  High blood pressure causes heart disease and increases the risk of stroke. High blood pressure is more likely to develop in: ? People who have blood pressure in the high end of the normal range (130-139/85-89 mm Hg). ? People who are overweight or obese. ? People who are African American.  If you are 89-70 years of age, have your blood pressure checked every 3-5 years. If you are 67 years of age or older, have your  blood pressure checked every year. You should have your blood pressure measured twice-once when you are at a hospital or clinic, and once when you are not at a hospital or clinic. Record the average of the two measurements. To check your blood pressure when you are not at a hospital or clinic, you can use: ? An automated blood pressure machine at a pharmacy. ? A home blood pressure monitor.  If you are between 65 years and 33 years old, ask your health care provider if you should take aspirin to prevent strokes.  Have regular diabetes screenings. This involves taking a blood sample to check your fasting blood sugar level. ? If you are at a normal weight and have a low risk for diabetes, have this test once every three years after 70 years of age. ? If you are overweight and have a high risk for diabetes, consider being tested at a younger age or more often. Preventing infection Hepatitis B  If you have a higher risk for hepatitis B, you should be screened for this virus. You are considered at high risk for hepatitis B if: ? You were born in a country where hepatitis B is common. Ask your health care provider which countries are considered high risk. ? Your parents were born in a high-risk country, and you have not been immunized against hepatitis B (hepatitis B vaccine). ? You have HIV or AIDS. ? You use needles to inject street drugs. ? You live with someone who has hepatitis B. ? You have had sex with someone who has hepatitis B. ? You get hemodialysis treatment. ? You take certain medicines for conditions, including cancer, organ transplantation, and autoimmune conditions.  Hepatitis C  Blood testing is recommended for: ? Everyone born from [birth year] through 1963. ? Anyone with known risk factors for hepatitis C.  Sexually transmitted infections (STIs)  You should be screened for sexually transmitted infections (STIs) including gonorrhea and chlamydia if: ? You are sexually active and  are younger than 70 years of age. ? You are older than 69 years of age and your health care provider tells you that you are at risk for this type of infection. ? Your sexual activity has changed since you were last screened and you are at an increased risk for chlamydia or gonorrhea. Ask your health care provider if you are at risk.  If you do not have HIV, but are at risk, it may be recommended that you take a prescription medicine daily to prevent HIV infection. This is called pre-exposure  prophylaxis (PrEP). You are considered at risk if: ? You are sexually active and do not regularly use condoms or know the HIV status of your partner(s). ? You take drugs by injection. ? You are sexually active with a partner who has HIV.  Talk with your health care provider about whether you are at high risk of being infected with HIV. If you choose to begin PrEP, you should first be tested for HIV. You should then be tested every 3 months for as long as you are taking PrEP. Pregnancy  If you are premenopausal and you may become pregnant, ask your health care provider about preconception counseling.  If you may become pregnant, take 400 to 800 micrograms (mcg) of folic acid every day.  If you want to prevent pregnancy, talk to your health care provider about birth control (contraception). Osteoporosis and menopause  Osteoporosis is a disease in which the bones lose minerals and strength with aging. This can result in serious bone fractures. Your risk for osteoporosis can be identified using a bone density scan.  If you are 75 years of age or older, or if you are at risk for osteoporosis and fractures, ask your health care provider if you should be screened.  Ask your health care provider whether you should take a calcium or vitamin D supplement to lower your risk for osteoporosis.  Menopause may have certain physical symptoms and risks.  Hormone replacement therapy may reduce some of these symptoms and  risks. Talk to your health care provider about whether hormone replacement therapy is right for you. Follow these instructions at home:  Schedule regular health, dental, and eye exams.  Stay current with your immunizations.  Do not use any tobacco products including cigarettes, chewing tobacco, or electronic cigarettes.  If you are pregnant, do not drink alcohol.  If you are breastfeeding, limit how much and how often you drink alcohol.  Limit alcohol intake to no more than 1 drink per day for nonpregnant women. One drink equals 12 ounces of beer, 5 ounces of wine, or 1 ounces of hard liquor.  Do not use street drugs.  Do not share needles.  Ask your health care provider for help if you need support or information about quitting drugs.  Tell your health care provider if you often feel depressed.  Tell your health care provider if you have ever been abused or do not feel safe at home. This information is not intended to replace advice given to you by your health care provider. Make sure you discuss any questions you have with your health care provider. Document Released: 07/10/2010 Document Revised: 06/02/2015 Document Reviewed: 09/28/2014 Elsevier Interactive Patient Education  [employer].

## 2017-12-15 NOTE — Progress Notes (Signed)
Subjective:    Patient ID: Sydney Robinson, female    DOB: 15-Sep-1945, 72 y.o.   MRN: [id number]  HPI She is here for a physical exam.   She has had right hip pain for a while.  It is better with more walking.  She also has some right knee pain.  She thinks she needs to have them evaluated.  She needs to start walking regularly.    Medications and allergies reviewed with patient and updated if appropriate.  Patient Active Problem List   Diagnosis Date Noted  . GERD (gastroesophageal reflux disease) 12/24/2016  . Insomnia 12/24/2016  . Obesity 07/04/2016  . Internal hemorrhoids 07/04/2016  . Lower back pain 12/23/2015  . Prediabetes 11/04/2012  . RAD (reactive airway disease) 12/03/2011  . DIVERTICULOSIS, COLON 08/12/2009  . Hyperlipidemia 06/04/2006  . Essential hypertension 06/04/2006  . History of colonic polyps 06/04/2006    Current Outpatient Medications on File Prior to Visit  Medication Sig Dispense Refill  . Cetirizine HCl (ZYRTEC PO) Take 1 tablet by mouth daily.     No current facility-administered medications on file prior to visit.     Past Medical History:  Diagnosis Date  . 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 Diverticulosis   . Hyperlipidemia   . Hypertension   . Murmur     Past Surgical History:  Procedure Laterality Date  . BREAST EXCISIONAL BIOPSY Bilateral 1970s  . COLONOSCOPY W/ POLYPECTOMY  1998    negative 2003 & 2008; 2013  . DILATION AND CURETTAGE OF UTERUS     x 3  . fibroidectomy     x3  . g2 p2    . hemorrhoidal banding  2011   Dr Earlean Shawl  . Neuroma Resected Foot     Right   . OVARIAN CYST REMOVAL    . ROTATOR CUFF REPAIR    . TONSILLECTOMY      Social History   Socioeconomic History  . Marital status: Married    Spouse name: Not on file  . Number of children: Not on file  . Years of education: Not on file  . Highest education level: Not on file  Occupational History  . Not on file  Social Needs  . Financial resource  strain: Not on file  . Food insecurity:    Worry: Not on file    Inability: Not on file  . Transportation needs:    Medical: Not on file    Non-medical: Not on file  Tobacco Use  . Smoking status: Never Smoker  . Smokeless tobacco: Never Used  Substance and Sexual Activity  . Alcohol use: No    Alcohol/week: 0.0 standard drinks    Comment: Wine rarely  . Drug use: No  . Sexual activity: Not on file  Lifestyle  . Physical activity:    Days per week: Not on file    Minutes per session: Not on file  . Stress: Not on file  Relationships  . Social connections:    Talks on phone: Not on file    Gets together: Not on file    Attends religious service: Not on file    Active member of club or organization: Not on file    Attends meetings of clubs or organizations: Not on file    Relationship status: Not on file  Other Topics Concern  . Not on file  Social History Narrative  . Not on file    Family History  Problem  Relation Age of Onset  . Diabetes Mother   . Alzheimer's disease Mother   . Heart failure Mother   . Glaucoma Mother   . Heart attack Maternal Aunt 59  . Stroke Paternal Aunt 41       cns aneurysm  . Uterine cancer Paternal Aunt   . Breast cancer Sister        2 sisters   . Diverticulitis Sister   . Breast cancer Sister 70  . Glaucoma Sister   . Diabetes Maternal Grandmother   . Diabetes Other        MGGM  . Colon cancer Other        cousin  . Diabetes Maternal Aunt   . Diabetes Maternal Uncle   . Diabetes Paternal Aunt     Review of Systems  Constitutional: Negative for chills and fever.  Eyes: Negative for visual disturbance.  Respiratory: Negative for cough, shortness of breath and wheezing.   Cardiovascular: Negative for chest pain, palpitations and leg swelling.  Gastrointestinal: Negative for abdominal pain, blood in stool, constipation, diarrhea and nausea.       Mild gerd  Genitourinary: Negative for dysuria and hematuria.  Musculoskeletal:  Positive for arthralgias.  Skin: Negative for color change and rash.  Neurological: Positive for headaches (occ). Negative for dizziness and light-headedness.  Psychiatric/Behavioral: Negative for dysphoric mood. The patient is not nervous/anxious.        Objective:   Vitals:   12/16/17 1325  BP: 118/72  Pulse: 67  Resp: 16  Temp: 99.4 F (37.4 C)  SpO2: 95%   Filed Weights   12/16/17 1325  Weight: 198 lb (89.8 kg)   Body mass index is 30.11 kg/m.  BP Readings from Last 3 Encounters:  12/16/17 118/72  12/25/16 118/82  05/14/16 126/80    Wt Readings from Last 3 Encounters:  12/16/17 198 lb (89.8 kg)  12/25/16 208 lb (94.3 kg)  05/14/16 202 lb (91.6 kg)     Physical Exam Constitutional: She appears well-developed and well-nourished. No distress.  HENT:  Head: Normocephalic and atraumatic.  Right Ear: External ear normal. Normal ear canal and TM Left Ear: External ear normal.  Normal ear canal and TM Mouth/Throat: Oropharynx is clear and moist.  Eyes: Conjunctivae and EOM are normal.  Neck: Neck supple. No tracheal deviation present. No thyromegaly present.  No carotid bruit  Cardiovascular: Normal rate, regular rhythm and normal heart sounds.   No murmur heard.  No edema. Pulmonary/Chest: Effort normal and breath sounds normal. No respiratory distress. She has no wheezes. She has no rales.  Breast: deferred to Gyn Abdominal: Soft. She exhibits no distension. There is no tenderness.  Lymphadenopathy: She has no cervical adenopathy.  Skin: Skin is warm and dry. She is not diaphoretic.  Psychiatric: She has a normal mood and affect. Her behavior is normal.        Assessment & Plan:   Physical exam: Screening blood work  ordered Immunizations   Discussed shingrix, others up to date Colonoscopy   Up to date  Mammogram   Up to date  89 -  Dr Radene Knee  - Up to date  Dexa  Due - done a gyn's office - will discuss at her upcoming appt Eye exams  Up to date    Exercise  Walking sporadically - stressed regularly walking Weight   Lost weight since husband's death Skin  No concerns Substance abuse  none  See Problem List for Assessment and Plan of chronic  medical problems.   FU yearly

## 2017-12-16 ENCOUNTER — Encounter: Payer: Self-pay | Admitting: Internal Medicine

## 2017-12-16 ENCOUNTER — Other Ambulatory Visit (INDEPENDENT_AMBULATORY_CARE_PROVIDER_SITE_OTHER): Payer: Medicare PPO

## 2017-12-16 ENCOUNTER — Ambulatory Visit (INDEPENDENT_AMBULATORY_CARE_PROVIDER_SITE_OTHER): Payer: Medicare PPO | Admitting: Internal Medicine

## 2017-12-16 VITALS — BP 118/72 | HR 67 | Temp 99.4°F | Resp 16 | Ht 68.0 in | Wt 198.0 lb

## 2017-12-16 DIAGNOSIS — E7849 Other hyperlipidemia: Secondary | ICD-10-CM

## 2017-12-16 DIAGNOSIS — M25551 Pain in right hip: Secondary | ICD-10-CM | POA: Diagnosis not present

## 2017-12-16 DIAGNOSIS — Z0000 Encounter for general adult medical examination without abnormal findings: Secondary | ICD-10-CM

## 2017-12-16 DIAGNOSIS — M1611 Unilateral primary osteoarthritis, right hip: Secondary | ICD-10-CM | POA: Insufficient documentation

## 2017-12-16 DIAGNOSIS — K219 Gastro-esophageal reflux disease without esophagitis: Secondary | ICD-10-CM | POA: Diagnosis not present

## 2017-12-16 DIAGNOSIS — M25561 Pain in right knee: Secondary | ICD-10-CM | POA: Diagnosis not present

## 2017-12-16 DIAGNOSIS — R7303 Prediabetes: Secondary | ICD-10-CM | POA: Diagnosis not present

## 2017-12-16 DIAGNOSIS — G4700 Insomnia, unspecified: Secondary | ICD-10-CM | POA: Diagnosis not present

## 2017-12-16 DIAGNOSIS — I10 Essential (primary) hypertension: Secondary | ICD-10-CM

## 2017-12-16 LAB — COMPREHENSIVE METABOLIC PANEL
ALT: 6 U/L (ref 0–35)
AST: 13 U/L (ref 0–37)
Albumin: 4.6 g/dL (ref 3.5–5.2)
Alkaline Phosphatase: 56 U/L (ref 39–117)
BUN: 10 mg/dL (ref 6–23)
CO2: 28 meq/L (ref 19–32)
Calcium: 9.8 mg/dL (ref 8.4–10.5)
Chloride: 104 mEq/L (ref 96–112)
Creatinine, Ser: 0.96 mg/dL (ref 0.40–1.20)
GFR: 73.7 mL/min (ref >60.00)
GLUCOSE: 95 mg/dL (ref 70–99)
Potassium: 4 mEq/L (ref 3.5–5.1)
Sodium: 142 mEq/L (ref 135–145)
Total Bilirubin: 1 mg/dL (ref 0.2–1.2)
Total Protein: 7 g/dL (ref 6.0–8.3)

## 2017-12-16 LAB — HEMOGLOBIN A1C: HEMOGLOBIN A1C: 5.7 % (ref 4.6–6.5)

## 2017-12-16 LAB — CBC WITH DIFFERENTIAL/PLATELET
Basophils Absolute: 0 10*3/uL (ref 0.0–0.1)
Basophils Relative: 0.4 % (ref 0.0–3.0)
Eosinophils Absolute: 0.1 10*3/uL (ref 0.0–0.7)
Eosinophils Relative: 1.1 % (ref 0.0–5.0)
HCT: 40.7 % (ref 36.0–46.0)
Hemoglobin: 13.9 g/dL (ref 12.0–15.0)
LYMPHS ABS: 1.9 10*3/uL (ref 0.7–4.0)
Lymphocytes Relative: 39.9 % (ref 12.0–46.0)
MCHC: 34.2 g/dL (ref 30.0–36.0)
MCV: 85.7 fl (ref 78.0–100.0)
MONOS PCT: 8.1 % (ref 3.0–12.0)
Monocytes Absolute: 0.4 10*3/uL (ref 0.1–1.0)
NEUTROS ABS: 2.4 10*3/uL (ref 1.4–7.7)
NEUTROS PCT: 50.5 % (ref 43.0–77.0)
Platelets: 228 10*3/uL (ref 150.0–400.0)
RBC: 4.75 Mil/uL (ref 3.87–5.11)
RDW: 13.4 % (ref 11.5–15.5)
WBC: 4.8 10*3/uL (ref 4.0–10.5)

## 2017-12-16 LAB — LIPID PANEL
CHOL/HDL RATIO: 3
Cholesterol: 193 mg/dL (ref 0–200)
HDL: 71.9 mg/dL (ref >39.00)
LDL Cholesterol: 104 mg/dL — ABNORMAL HIGH (ref 0–99)
NonHDL: 121.39
Triglycerides: 88 mg/dL (ref 0.0–149.0)
VLDL: 17.6 mg/dL (ref 0.0–40.0)

## 2017-12-16 LAB — TSH: TSH: 1.31 u[IU]/mL (ref 0.35–4.50)

## 2017-12-16 MED ORDER — LABETALOL HCL 300 MG PO TABS: 150.0000 mg | ORAL_TABLET | Freq: Two times a day (BID) | ORAL | 3 refills | Status: DC

## 2017-12-16 MED ORDER — HYDROCHLOROTHIAZIDE 12.5 MG PO CAPS: 12.5000 mg | ORAL_CAPSULE | Freq: Every day | ORAL | 3 refills | Status: DC

## 2017-12-16 MED ORDER — SIMVASTATIN 40 MG PO TABS: ORAL_TABLET | ORAL | 3 refills | Status: DC

## 2017-12-16 MED ORDER — CLONAZEPAM 0.5 MG PO TABS: ORAL_TABLET | ORAL | 2 refills | Status: DC

## 2017-12-16 NOTE — Assessment & Plan Note (Signed)
BP well controlled Current regimen effective and well tolerated Continue current medications at current doses cmp  

## 2017-12-16 NOTE — Assessment & Plan Note (Signed)
Takes clonazepam about once a week No side effects Will refill

## 2017-12-16 NOTE — Assessment & Plan Note (Signed)
Check a1c Low sugar / carb diet Stressed regular exercise, weight loss  

## 2017-12-16 NOTE — Assessment & Plan Note (Signed)
Referred to orthopedics

## 2017-12-16 NOTE — Assessment & Plan Note (Signed)
Has mild gerd at times Can take otc pepcid or tums as needed

## 2017-12-16 NOTE — Assessment & Plan Note (Signed)
Check lipid panel,cmp ,tsh Continue daily statin Regular exercise and healthy diet encouraged  

## 2017-12-18 ENCOUNTER — Encounter: Payer: Self-pay | Admitting: Internal Medicine

## 2018-01-23 ENCOUNTER — Ambulatory Visit (INDEPENDENT_AMBULATORY_CARE_PROVIDER_SITE_OTHER): Payer: Self-pay | Admitting: Orthopaedic Surgery

## 2018-01-28 ENCOUNTER — Ambulatory Visit (INDEPENDENT_AMBULATORY_CARE_PROVIDER_SITE_OTHER): Payer: Medicare PPO

## 2018-01-28 ENCOUNTER — Ambulatory Visit (INDEPENDENT_AMBULATORY_CARE_PROVIDER_SITE_OTHER): Payer: Medicare PPO | Admitting: Orthopaedic Surgery

## 2018-01-28 DIAGNOSIS — M25561 Pain in right knee: Secondary | ICD-10-CM

## 2018-01-28 DIAGNOSIS — M1611 Unilateral primary osteoarthritis, right hip: Secondary | ICD-10-CM | POA: Diagnosis not present

## 2018-01-28 DIAGNOSIS — G8929 Other chronic pain: Secondary | ICD-10-CM

## 2018-01-28 NOTE — Progress Notes (Signed)
Office Visit Note   Patient: Sydney Robinson           Date of Birth: 1945/12/25           MRN: [id number] Visit Date: 01/28/2018              Requested by: [name], MD [place], [place] [id number] PCP: [name], MD   Assessment & Plan: Visit Diagnoses:  1. Chronic pain of right knee   2. Primary osteoarthritis of right hip     Plan: Impression is moderate to severe right hip degenerative joint disease although clinically she seems to be doing just fine.  She will to continue to take Advil Aleve and exercise.  I offered cortisone injection but overall she seems to be doing okay.  Right knee is also mild in terms of symptoms.  We will continue to exercise and use Advil or Aleve as needed.  Questions encouraged and answered.  Follow-up as needed.  Follow-Up Instructions: Return if symptoms worsen or fail to improve.   Orders:  Orders Placed This Encounter  Procedures  . XR KNEE 3 VIEW RIGHT  . XR Pelvis 1-2 Views   No orders of the defined types were placed in this encounter.     Procedures: No procedures performed   Clinical Data: No additional findings.   Subjective: Chief Complaint  Patient presents with  . Right Hip - Pain  . Right Knee - Pain    Sydney Robinson is a 73 year old female comes in with hip and groin pain with some mild right knee pain.  She denies any injuries.  The pain in her hip is worse with crossing her legs.  Sometimes it will feel like it wants to give out.  Exercising helps.  Advil Aleve also help.  In terms of her right knee she reports some start up stiffness and pain.  This is mild overall.   Review of Systems  Constitutional: Negative.   HENT: Negative.   Eyes: Negative.   Respiratory: Negative.   Cardiovascular: Negative.   Endocrine: Negative.   Musculoskeletal: Negative.   Neurological: Negative.   Hematological: Negative.   Psychiatric/Behavioral: Negative.   All other systems reviewed and are  negative.    Objective: Vital Signs: There were no vitals taken for this visit.  Physical Exam Vitals signs and nursing note reviewed.  Constitutional:      Appearance: She is well-developed.  HENT:     Head: Normocephalic and atraumatic.  Neck:     Musculoskeletal: Neck supple.  Pulmonary:     Effort: Pulmonary effort is normal.  Abdominal:     Palpations: Abdomen is soft.  Skin:    General: Skin is warm.     Capillary Refill: Capillary refill takes less than 2 seconds.  Neurological:     Mental Status: She is alert and oriented to person, place, and time.  Psychiatric:        Behavior: Behavior normal.        Thought Content: Thought content normal.        Judgment: Judgment normal.     Ortho Exam Right hip exam shows positive FADIR.  Positive Stinchfield sign.  Mildly positive logroll.  Right knee exam shows no joint effusion.  Normal range of motion.  Collaterals and cruciates are stable. Specialty Comments:  No specialty comments available.  Imaging: No results found.   PMFS History: Patient Active Problem List   Diagnosis Date Noted  .  Primary osteoarthritis of right hip 01/28/2018  . Right hip pain 12/16/2017  . Right knee pain 12/16/2017  . GERD (gastroesophageal reflux disease) 12/24/2016  . Insomnia 12/24/2016  . Obesity 07/04/2016  . Internal hemorrhoids 07/04/2016  . Lower back pain 12/23/2015  . Prediabetes 11/04/2012  . RAD (reactive airway disease) 12/03/2011  . DIVERTICULOSIS, COLON 08/12/2009  . Hyperlipidemia 06/04/2006  . Essential hypertension 06/04/2006  . History of colonic polyps 06/04/2006   Past Medical History:  Diagnosis Date  . 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 Diverticulosis   . Hyperlipidemia   . Hypertension   . Murmur     Family History  Problem Relation Age of Onset  . Diabetes Mother   . Alzheimer's disease Mother   . Heart failure Mother   . Glaucoma Mother   . Heart attack Maternal Aunt 59  . Stroke  Paternal Aunt 7       cns aneurysm  . Uterine cancer Paternal Aunt   . Breast cancer Sister        2 sisters   . Diverticulitis Sister   . Breast cancer Sister 19  . Glaucoma Sister   . Diabetes Maternal Grandmother   . Diabetes Other        MGGM  . Colon cancer Other        cousin  . Diabetes Maternal Aunt   . Diabetes Maternal Uncle   . Diabetes Paternal Aunt     Past Surgical History:  Procedure Laterality Date  . BREAST EXCISIONAL BIOPSY Bilateral 1970s  . COLONOSCOPY W/ POLYPECTOMY  1998    negative 2003 & 2008; 2013  . DILATION AND CURETTAGE OF UTERUS     x 3  . fibroidectomy     x3  . g2 p2    . hemorrhoidal banding  2011   Dr Earlean Shawl  . Neuroma Resected Foot     Right   . OVARIAN CYST REMOVAL    . ROTATOR CUFF REPAIR    . TONSILLECTOMY     Social History   Occupational History  . Not on file  Tobacco Use  . Smoking status: Never Smoker  . Smokeless tobacco: Never Used  Substance and Sexual Activity  . Alcohol use: No    Alcohol/week: 0.0 standard drinks    Comment: Wine rarely  . Drug use: No  . Sexual activity: Not on file

## 2018-02-25 DIAGNOSIS — Z6832 Body mass index (BMI) 32.0-32.9, adult: Secondary | ICD-10-CM | POA: Diagnosis not present

## 2018-02-25 DIAGNOSIS — Z01419 Encounter for gynecological examination (general) (routine) without abnormal findings: Secondary | ICD-10-CM | POA: Diagnosis not present

## 2018-07-01 DIAGNOSIS — H25813 Combined forms of age-related cataract, bilateral: Secondary | ICD-10-CM | POA: Diagnosis not present

## 2018-07-01 DIAGNOSIS — H52203 Unspecified astigmatism, bilateral: Secondary | ICD-10-CM | POA: Diagnosis not present

## 2018-07-01 DIAGNOSIS — H5213 Myopia, bilateral: Secondary | ICD-10-CM | POA: Diagnosis not present

## 2018-08-19 DIAGNOSIS — Z20828 Contact with and (suspected) exposure to other viral communicable diseases: Secondary | ICD-10-CM | POA: Diagnosis not present

## 2018-11-10 ENCOUNTER — Other Ambulatory Visit: Payer: Self-pay | Admitting: Internal Medicine

## 2018-11-10 DIAGNOSIS — Z1231 Encounter for screening mammogram for malignant neoplasm of breast: Secondary | ICD-10-CM

## 2018-12-23 ENCOUNTER — Other Ambulatory Visit: Payer: Self-pay | Admitting: Internal Medicine

## 2018-12-31 ENCOUNTER — Ambulatory Visit
Admission: RE | Admit: 2018-12-31 | Discharge: 2018-12-31 | Disposition: A | Discharge status: Home / Self Care | Payer: Medicare PPO | Source: Ambulatory Visit | Attending: Internal Medicine | Admitting: Internal Medicine

## 2018-12-31 ENCOUNTER — Other Ambulatory Visit: Payer: Self-pay

## 2018-12-31 DIAGNOSIS — Z1231 Encounter for screening mammogram for malignant neoplasm of breast: Secondary | ICD-10-CM | POA: Diagnosis not present

## 2019-01-16 ENCOUNTER — Encounter: Payer: Medicare PPO | Admitting: Internal Medicine

## 2019-01-17 ENCOUNTER — Other Ambulatory Visit: Payer: Self-pay | Admitting: Internal Medicine

## 2019-01-19 NOTE — Patient Instructions (Addendum)
Blood work was ordered.    All other Health Maintenance issues reviewed.   All recommended immunizations and age-appropriate screenings are up-to-date or discussed.  No immunization administered today.   Medications reviewed and updated.  Changes include :   none  Your prescription(s) have been submitted to your pharmacy. Please take as directed and contact our office if you believe you are having problem(s) with the medication(s).    Please followup in 1 year    Health Maintenance, Female Adopting a healthy lifestyle and getting preventive care are important in promoting health and wellness. Ask your health care provider about:  The right schedule for you to have regular tests and exams.  Things you can do on your own to prevent diseases and keep yourself healthy. What should I know about diet, weight, and exercise? Eat a healthy diet   Eat a diet that includes plenty of vegetables, fruits, low-fat dairy products, and lean protein.  Do not eat a lot of foods that are high in solid fats, added sugars, or sodium. Maintain a healthy weight Body mass index (BMI) is used to identify weight problems. It estimates body fat based on height and weight. Your health care provider can help determine your BMI and help you achieve or maintain a healthy weight. Get regular exercise Get regular exercise. This is one of the most important things you can do for your health. Most adults should:  Exercise for at least 150 minutes each week. The exercise should increase your heart rate and make you sweat (moderate-intensity exercise).  Do strengthening exercises at least twice a week. This is in addition to the moderate-intensity exercise.  Spend less time sitting. Even light physical activity can be beneficial. Watch cholesterol and blood lipids Have your blood tested for lipids and cholesterol at 72 years of age, then have this test every 5 years. Have your cholesterol levels checked more  often if:  Your lipid or cholesterol levels are high.  You are older than 72 years of age.  You are at high risk for heart disease. What should I know about cancer screening? Depending on your health history and family history, you may need to have cancer screening at various ages. This may include screening for:  Breast cancer.  Cervical cancer.  Colorectal cancer.  Skin cancer.  Lung cancer. What should I know about heart disease, diabetes, and high blood pressure? Blood pressure and heart disease  High blood pressure causes heart disease and increases the risk of stroke. This is more likely to develop in people who have high blood pressure readings, are of African descent, or are overweight.  Have your blood pressure checked: ? Every 3-5 years if you are 18-39 years of age. ? Every year if you are 40 years old or older. Diabetes Have regular diabetes screenings. This checks your fasting blood sugar level. Have the screening done:  Once every three years after age 40 if you are at a normal weight and have a low risk for diabetes.  More often and at a younger age if you are overweight or have a high risk for diabetes. What should I know about preventing infection? Hepatitis B If you have a higher risk for hepatitis B, you should be screened for this virus. Talk with your health care provider to find out if you are at risk for hepatitis B infection. Hepatitis C Testing is recommended for:  Everyone born from 1945 through 1965.  Anyone with known risk factors for hepatitis   C. Sexually transmitted infections (STIs)  Get screened for STIs, including gonorrhea and chlamydia, if: ? You are sexually active and are younger than 72 years of age. ? You are older than 72 years of age and your health care provider tells you that you are at risk for this type of infection. ? Your sexual activity has changed since you were last screened, and you are at increased risk for chlamydia  or gonorrhea. Ask your health care provider if you are at risk.  Ask your health care provider about whether you are at high risk for HIV. Your health care provider may recommend a prescription medicine to help prevent HIV infection. If you choose to take medicine to prevent HIV, you should first get tested for HIV. You should then be tested every 3 months for as long as you are taking the medicine. Pregnancy  If you are about to stop having your period (premenopausal) and you may become pregnant, seek counseling before you get pregnant.  Take 400 to 800 micrograms (mcg) of folic acid every day if you become pregnant.  Ask for birth control (contraception) if you want to prevent pregnancy. Osteoporosis and menopause Osteoporosis is a disease in which the bones lose minerals and strength with aging. This can result in bone fractures. If you are 65 years old or older, or if you are at risk for osteoporosis and fractures, ask your health care provider if you should:  Be screened for bone loss.  Take a calcium or vitamin D supplement to lower your risk of fractures.  Be given hormone replacement therapy (HRT) to treat symptoms of menopause. Follow these instructions at home: Lifestyle  Do not use any products that contain nicotine or tobacco, such as cigarettes, e-cigarettes, and chewing tobacco. If you need help quitting, ask your health care provider.  Do not use street drugs.  Do not share needles.  Ask your health care provider for help if you need support or information about quitting drugs. Alcohol use  Do not drink alcohol if: ? Your health care provider tells you not to drink. ? You are pregnant, may be pregnant, or are planning to become pregnant.  If you drink alcohol: ? Limit how much you use to 0-1 drink a day. ? Limit intake if you are breastfeeding.  Be aware of how much alcohol is in your drink. In the [place] one drink equals one 12 oz bottle of beer (355 mL), one 5 oz  glass of wine (148 mL), or one 1 oz glass of hard liquor (44 mL). General instructions  Schedule regular health, dental, and eye exams.  Stay current with your vaccines.  Tell your health care provider if: ? You often feel depressed. ? You have ever been abused or do not feel safe at home. Summary  Adopting a healthy lifestyle and getting preventive care are important in promoting health and wellness.  Follow your health care provider's instructions about healthy diet, exercising, and getting tested or screened for diseases.  Follow your health care provider's instructions on monitoring your cholesterol and blood pressure. This information is not intended to replace advice given to you by your health care provider. Make sure you discuss any questions you have with your health care provider. Document Revised: 12/18/2017 Document Reviewed: 12/18/2017 Elsevier Patient Education  2020 [employer] Inc[employer]  

## 2019-01-19 NOTE — Progress Notes (Signed)
Subjective:    Patient ID: Sydney Robinson, female    DOB: 1945/05/24, 74 y.o.   MRN: [id number]  HPI She is here for a physical exam.   With exerting herself she states that other people tell her that she sounds winded.  She does not notice this and she does not feel short of breath with exerting herself.  She was concerned this may be a side effect from one of her blood pressure medications-she did agree that it could cause shortness of breath.  He again denies feeling short of breath at any time she has not been exercising regularly.  She walks occasionally in the park.  She denies any changes in her health.  He has no concerns.   Medications and allergies reviewed with patient and updated if appropriate.  Patient Active Problem List   Diagnosis Date Noted  . Primary osteoarthritis of right hip 01/28/2018  . Right hip pain 12/16/2017  . Right knee pain 12/16/2017  . Insomnia 12/24/2016  . Obesity 07/04/2016  . Internal hemorrhoids 07/04/2016  . Lower back pain 12/23/2015  . Prediabetes 11/04/2012  . RAD (reactive airway disease) 12/03/2011  . DIVERTICULOSIS, COLON 08/12/2009  . Hyperlipidemia 06/04/2006  . Essential hypertension 06/04/2006  . History of colonic polyps 06/04/2006    Current Outpatient Medications on File Prior to Visit  Medication Sig Dispense Refill  . Cetirizine HCl (ZYRTEC PO) Take 1 tablet by mouth daily.    . clonazePAM (KLONOPIN) 0.5 MG tablet TAKE 1/2-1 TABLET AT BEDTIME AS NEEDED 30 tablet 2  . hydrochlorothiazide (MICROZIDE) 12.5 MG capsule TAKE 1 CAPSULE BY MOUTH DAILY. *NEED OFFICE VISIT FOR MORE REFILLS* 30 capsule 0  . labetalol (NORMODYNE) 300 MG tablet Take 0.5 tablets (150 mg total) by mouth 2 (two) times daily. 180 tablet 3  . polycarbophil (FIBERCON) 625 MG tablet Take 625 mg by mouth daily.    . simvastatin (ZOCOR) 40 MG tablet TAKE 1 TABLET (40 MG TOTAL) BY MOUTH AT BEDTIME. 90 tablet 3   No current facility-administered medications  on file prior to visit.    Past Medical History:  Diagnosis Date  . 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 Diverticulosis   . Hyperlipidemia   . Hypertension   . Murmur     Past Surgical History:  Procedure Laterality Date  . BREAST EXCISIONAL BIOPSY Bilateral 1970s  . COLONOSCOPY W/ POLYPECTOMY  1998    negative 2003 & 2008; 2013  . DILATION AND CURETTAGE OF UTERUS     x 3  . fibroidectomy     x3  . g2 p2    . hemorrhoidal banding  2011   Dr Earlean Shawl  . Neuroma Resected Foot     Right   . OVARIAN CYST REMOVAL    . ROTATOR CUFF REPAIR    . TONSILLECTOMY      Social History   Socioeconomic History  . Marital status: Widowed    Spouse name: Not on file  . Number of children: Not on file  . Years of education: Not on file  . Highest education level: Not on file  Occupational History  . Not on file  Tobacco Use  . Smoking status: Never Smoker  . Smokeless tobacco: Never Used  Substance and Sexual Activity  . Alcohol use: No    Alcohol/week: 0.0 standard drinks    Comment: Wine rarely  . Drug use: No  . Sexual activity: Not on file  Other Topics Concern  .  Not on file  Social History Narrative  . Not on file   Social Determinants of Health   Financial Resource Strain:   . Difficulty of Paying Living Expenses: Not on file  Food Insecurity:   . Worried About Charity fundraiser in the Last Year: Not on file  . Ran Out of Food in the Last Year: Not on file  Transportation Needs:   . Lack of Transportation (Medical): Not on file  . Lack of Transportation (Non-Medical): Not on file  Physical Activity:   . Days of Exercise per Week: Not on file  . Minutes of Exercise per Session: Not on file  Stress:   . Feeling of Stress : Not on file  Social Connections:   . Frequency of Communication with Friends and Family: Not on file  . Frequency of Social Gatherings with Friends and Family: Not on file  . Attends Religious Services: Not on file  . Active Member of  Clubs or Organizations: Not on file  . Attends Archivist Meetings: Not on file  . Marital Status: Not on file    Family History  Problem Relation Age of Onset  . Diabetes Mother   . Alzheimer's disease Mother   . Heart failure Mother   . Glaucoma Mother   . Heart attack Maternal Aunt 59  . Stroke Paternal Aunt 53       cns aneurysm  . Uterine cancer Paternal Aunt   . Breast cancer Sister        2 sisters   . Diverticulitis Sister   . Breast cancer Sister 16  . Glaucoma Sister   . Diabetes Maternal Grandmother   . Diabetes Other        MGGM  . Colon cancer Other        cousin  . Diabetes Maternal Aunt   . Diabetes Maternal Uncle   . Diabetes Paternal Aunt     Review of Systems  Constitutional: Negative for chills, fatigue and fever.  Eyes: Negative for visual disturbance.  Respiratory: Negative for cough, shortness of breath and wheezing.   Cardiovascular: Negative for chest pain, palpitations and leg swelling.  Gastrointestinal: Negative for abdominal pain, blood in stool, constipation, diarrhea and nausea.       Occ GERD  Genitourinary: Negative for dysuria and hematuria.  Musculoskeletal: Positive for arthralgias and back pain.  Skin: Negative for color change and rash.  Neurological: Positive for headaches (occ). Negative for dizziness and light-headedness.  Psychiatric/Behavioral: Negative for dysphoric mood. The patient is not nervous/anxious.        Objective:   Vitals:   01/20/19 1024  BP: 136/84  Pulse: 65  Resp: 16  Temp: 98.3 F (36.8 C)  SpO2: 99%   Filed Weights   01/20/19 1024  Weight: 198 lb (89.8 kg)   Body mass index is 30.11 kg/m.  BP Readings from Last 3 Encounters:  01/20/19 136/84  12/16/17 118/72  12/25/16 118/82    Wt Readings from Last 3 Encounters:  01/20/19 198 lb (89.8 kg)  12/16/17 198 lb (89.8 kg)  12/25/16 208 lb (94.3 kg)     Physical Exam Constitutional: She appears well-developed and well-nourished.  No distress.  HENT:  Head: Normocephalic and atraumatic.  Right Ear: External ear normal. Normal ear canal and TM Left Ear: External ear normal.  Normal ear canal and TM Mouth/Throat: Oropharynx is clear and moist.  Eyes: Conjunctivae and EOM are normal.  Neck: Neck supple. No tracheal deviation  present. No thyromegaly present.  No carotid bruit  Cardiovascular: Normal rate, regular rhythm and normal heart sounds.   No murmur heard.  No edema. Pulmonary/Chest: Effort normal and breath sounds normal. No respiratory distress. She has no wheezes. She has no rales.  Breast: deferred   Abdominal: Soft. She exhibits no distension. There is no tenderness.  Lymphadenopathy: She has no cervical adenopathy.  Skin: Skin is warm and dry. She is not diaphoretic.  Psychiatric: She has a normal mood and affect. Her behavior is normal.        Assessment & Plan:   Physical exam: Screening blood work    ordered Immunizations  Discussed tdap, shingrix and covid Colonoscopy  Up to date  Mammogram  Up to date  Gyn  Dr Radene Knee -  Due - will schedule Dexa  Up to date-- normal in 2017 Eye exams  Up to date  Exercise  Walks on occasion Weight   Substance abuse  none  See Problem List for Assessment and Plan of chronic medical problems.    This visit occurred during the SARS-CoV-2 public health emergency.  Safety protocols were in place, including screening questions prior to the visit, additional usage of staff PPE, and extensive cleaning of exam room while observing appropriate contact time as indicated for disinfecting solutions.

## 2019-01-20 ENCOUNTER — Other Ambulatory Visit: Payer: Self-pay

## 2019-01-20 ENCOUNTER — Ambulatory Visit (INDEPENDENT_AMBULATORY_CARE_PROVIDER_SITE_OTHER): Payer: Medicare PPO | Admitting: Internal Medicine

## 2019-01-20 ENCOUNTER — Encounter: Payer: Self-pay | Admitting: Internal Medicine

## 2019-01-20 VITALS — BP 136/84 | HR 65 | Temp 98.3°F | Resp 16 | Ht 68.0 in | Wt 198.0 lb

## 2019-01-20 DIAGNOSIS — E7849 Other hyperlipidemia: Secondary | ICD-10-CM | POA: Diagnosis not present

## 2019-01-20 DIAGNOSIS — Z6830 Body mass index (BMI) 30.0-30.9, adult: Secondary | ICD-10-CM | POA: Diagnosis not present

## 2019-01-20 DIAGNOSIS — R7303 Prediabetes: Secondary | ICD-10-CM | POA: Diagnosis not present

## 2019-01-20 DIAGNOSIS — G4700 Insomnia, unspecified: Secondary | ICD-10-CM | POA: Diagnosis not present

## 2019-01-20 DIAGNOSIS — I10 Essential (primary) hypertension: Secondary | ICD-10-CM

## 2019-01-20 DIAGNOSIS — E6609 Other obesity due to excess calories: Secondary | ICD-10-CM

## 2019-01-20 DIAGNOSIS — M1611 Unilateral primary osteoarthritis, right hip: Secondary | ICD-10-CM | POA: Diagnosis not present

## 2019-01-20 DIAGNOSIS — Z0000 Encounter for general adult medical examination without abnormal findings: Secondary | ICD-10-CM | POA: Diagnosis not present

## 2019-01-20 LAB — CBC WITH DIFFERENTIAL/PLATELET
Basophils Absolute: 0 10*3/uL (ref 0.0–0.1)
Basophils Relative: 0.3 % (ref 0.0–3.0)
Eosinophils Absolute: 0.1 10*3/uL (ref 0.0–0.7)
Eosinophils Relative: 1.3 % (ref 0.0–5.0)
HCT: 41.6 % (ref 36.0–46.0)
Hemoglobin: 13.8 g/dL (ref 12.0–15.0)
Lymphocytes Relative: 38 % (ref 12.0–46.0)
Lymphs Abs: 2 10*3/uL (ref 0.7–4.0)
MCHC: 33 g/dL (ref 30.0–36.0)
MCV: 88 fl (ref 78.0–100.0)
Monocytes Absolute: 0.4 10*3/uL (ref 0.1–1.0)
Monocytes Relative: 7.3 % (ref 3.0–12.0)
Neutro Abs: 2.8 10*3/uL (ref 1.4–7.7)
Neutrophils Relative %: 53.1 % (ref 43.0–77.0)
Platelets: 226 10*3/uL (ref 150.0–400.0)
RBC: 4.73 Mil/uL (ref 3.87–5.11)
RDW: 13.8 % (ref 11.5–15.5)
WBC: 5.2 10*3/uL (ref 4.0–10.5)

## 2019-01-20 LAB — COMPREHENSIVE METABOLIC PANEL
ALT: 7 U/L (ref 0–35)
AST: 12 U/L (ref 0–37)
Albumin: 4.5 g/dL (ref 3.5–5.2)
Alkaline Phosphatase: 51 U/L (ref 39–117)
BUN: 10 mg/dL (ref 6–23)
CO2: 30 mEq/L (ref 19–32)
Calcium: 9.7 mg/dL (ref 8.4–10.5)
Chloride: 105 mEq/L (ref 96–112)
Creatinine, Ser: 1.04 mg/dL (ref 0.40–1.20)
GFR: 63.02 mL/min (ref >60.00)
Glucose, Bld: 103 mg/dL — ABNORMAL HIGH (ref 70–99)
Potassium: 3.9 mEq/L (ref 3.5–5.1)
Sodium: 141 mEq/L (ref 135–145)
Total Bilirubin: 0.8 mg/dL (ref 0.2–1.2)
Total Protein: 7 g/dL (ref 6.0–8.3)

## 2019-01-20 LAB — LIPID PANEL
Cholesterol: 198 mg/dL (ref 0–200)
HDL: 74.1 mg/dL (ref >39.00)
LDL Cholesterol: 103 mg/dL — ABNORMAL HIGH (ref 0–99)
NonHDL: 123.65
Total CHOL/HDL Ratio: 3
Triglycerides: 105 mg/dL (ref 0.0–149.0)
VLDL: 21 mg/dL (ref 0.0–40.0)

## 2019-01-20 LAB — TSH: TSH: 1.33 u[IU]/mL (ref 0.35–4.50)

## 2019-01-20 LAB — HEMOGLOBIN A1C: Hgb A1c MFr Bld: 5.5 % (ref 4.6–6.5)

## 2019-01-20 MED ORDER — LABETALOL HCL 300 MG PO TABS: 150.0000 mg | ORAL_TABLET | Freq: Two times a day (BID) | ORAL | 3 refills | Status: DC

## 2019-01-20 MED ORDER — CLONAZEPAM 0.5 MG PO TABS: ORAL_TABLET | ORAL | 2 refills | Status: DC

## 2019-01-20 MED ORDER — SIMVASTATIN 40 MG PO TABS: ORAL_TABLET | ORAL | 3 refills | Status: DC

## 2019-01-20 MED ORDER — HYDROCHLOROTHIAZIDE 12.5 MG PO CAPS: ORAL_CAPSULE | ORAL | 3 refills | Status: DC

## 2019-01-20 NOTE — Assessment & Plan Note (Addendum)
Chronic With hypertension, hyperlipidemia and prediabetes Not currently exercising regularly and I stressed the importance of regular exercise Healthy diet, decrease portions

## 2019-01-20 NOTE — Assessment & Plan Note (Signed)
Chronic Check lipid panel, CMP, TSH Continue daily statin Regular exercise and healthy diet encouraged  

## 2019-01-20 NOTE — Assessment & Plan Note (Addendum)
Chronic She averages clonazepam 2-3 times a months only She does use some things over-the-counter that helps her sleep Overall controlled Okay to continue clonazepam-keep to minimum use

## 2019-01-20 NOTE — Assessment & Plan Note (Addendum)
Chronic BP well controlled Current regimen effective and well tolerated I do not think the windedness other people are seeing is a side effect from medication.  She denies true shortness of breath.  There is likely an element of deconditioning. Continue current medications at current doses CMP

## 2019-01-20 NOTE — Assessment & Plan Note (Signed)
Chronic Check a1c Low sugar / carb diet Stressed regular exercise  

## 2019-01-21 ENCOUNTER — Encounter: Payer: Self-pay | Admitting: Internal Medicine

## 2019-02-05 ENCOUNTER — Ambulatory Visit: Payer: Self-pay

## 2019-02-07 ENCOUNTER — Encounter: Payer: Self-pay | Admitting: Internal Medicine

## 2019-02-10 ENCOUNTER — Ambulatory Visit: Payer: Self-pay

## 2019-02-14 ENCOUNTER — Ambulatory Visit: Payer: Medicare PPO

## 2019-03-02 DIAGNOSIS — Z01419 Encounter for gynecological examination (general) (routine) without abnormal findings: Secondary | ICD-10-CM | POA: Diagnosis not present

## 2019-03-02 DIAGNOSIS — Z6832 Body mass index (BMI) 32.0-32.9, adult: Secondary | ICD-10-CM | POA: Diagnosis not present

## 2019-04-20 ENCOUNTER — Encounter: Payer: Self-pay | Admitting: Internal Medicine

## 2019-04-22 NOTE — Progress Notes (Signed)
Subjective:    Patient ID: Sydney Robinson, female    DOB: 08/04/1945, 74 y.o.   MRN: [id number]  HPI The patient is here for an acute visit.    SOB:  She has occasional episodes of feeling dyspnea on exertion.  Never feels it at rest.  Recalls a couple of episodes in October so it started least in the fall, possibly before that. Rest improves the dyspnea.  She does feel like her stamina is less.  She is still exercising-walks a few times a week, but not regularly.  Often walks 30 minutes, but has slowed down.  She does yard work.  When his got her to noticed mostly the shortness of breath is that other people are pointing out to her.  She does not feel that it has gotten worse or progressed.  The other day she felt very very tired and she checked her blood pressure and it was low-107/62 at 1 point was 107/67.  She held the hydrochlorothiazide and felt better.  She wondered if it was related to her blood pressure and if she needed the hydrochlorothiazide.     Medications and allergies reviewed with patient and updated if appropriate.  Patient Active Problem List   Diagnosis Date Noted  . Dyspnea on exertion 04/23/2019  . Primary osteoarthritis of right hip 01/28/2018  . Right knee pain 12/16/2017  . Insomnia 12/24/2016  . Obesity 07/04/2016  . Internal hemorrhoids 07/04/2016  . Lower back pain 12/23/2015  . Prediabetes 11/04/2012  . RAD (reactive airway disease) 12/03/2011  . DIVERTICULOSIS, COLON 08/12/2009  . Hyperlipidemia 06/04/2006  . Essential hypertension 06/04/2006  . History of colonic polyps 06/04/2006    Current Outpatient Medications on File Prior to Visit  Medication Sig Dispense Refill  . Biotin 1 MG CAPS Take by mouth.    . Cetirizine HCl (ZYRTEC PO) Take 1 tablet by mouth daily.    . Cholecalciferol (VITAMIN D3 PO) Take by mouth.    . clonazePAM (KLONOPIN) 0.5 MG tablet TAKE 1/2-1 TABLET AT BEDTIME AS NEEDED 30 tablet 2  . labetalol (NORMODYNE) 300 MG  tablet Take 0.5 tablets (150 mg total) by mouth 2 (two) times daily. 180 tablet 3  . polycarbophil (FIBERCON) 625 MG tablet Take 625 mg by mouth daily.    . simvastatin (ZOCOR) 40 MG tablet TAKE 1 TABLET (40 MG TOTAL) BY MOUTH AT BEDTIME. 90 tablet 3   No current facility-administered medications on file prior to visit.    Past Medical History:  Diagnosis Date  . 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 Diverticulosis   . Hyperlipidemia   . Hypertension   . Murmur     Past Surgical History:  Procedure Laterality Date  . BREAST EXCISIONAL BIOPSY Bilateral 1970s  . COLONOSCOPY W/ POLYPECTOMY  1998    negative 2003 & 2008; 2013  . DILATION AND CURETTAGE OF UTERUS     x 3  . fibroidectomy     x3  . g2 p2    . hemorrhoidal banding  2011   Dr Earlean Shawl  . Neuroma Resected Foot     Right   . OVARIAN CYST REMOVAL    . ROTATOR CUFF REPAIR    . TONSILLECTOMY      Social History   Socioeconomic History  . Marital status: Widowed    Spouse name: Not on file  . Number of children: Not on file  . Years of education: Not on file  . Highest education  level: Not on file  Occupational History  . Not on file  Tobacco Use  . Smoking status: Never Smoker  . Smokeless tobacco: Never Used  Substance and Sexual Activity  . Alcohol use: No    Alcohol/week: 0.0 standard drinks    Comment: Wine rarely  . Drug use: No  . Sexual activity: Not on file  Other Topics Concern  . Not on file  Social History Narrative  . Not on file   Social Determinants of Health   Financial Resource Strain:   . Difficulty of Paying Living Expenses:   Food Insecurity:   . Worried About Charity fundraiser in the Last Year:   . Arboriculturist in the Last Year:   Transportation Needs:   . Film/video editor (Medical):   Marland Kitchen Lack of Transportation (Non-Medical):   Physical Activity:   . Days of Exercise per Week:   . Minutes of Exercise per Session:   Stress:   . Feeling of Stress :   Social  Connections:   . Frequency of Communication with Friends and Family:   . Frequency of Social Gatherings with Friends and Family:   . Attends Religious Services:   . Active Member of Clubs or Organizations:   . Attends Archivist Meetings:   Marland Kitchen Marital Status:     Family History  Problem Relation Age of Onset  . Diabetes Mother   . Alzheimer's disease Mother   . Heart failure Mother   . Glaucoma Mother   . Heart attack Maternal Aunt 59  . Stroke Paternal Aunt 110       cns aneurysm  . Uterine cancer Paternal Aunt   . Breast cancer Sister        2 sisters   . Diverticulitis Sister   . Breast cancer Sister 37  . Glaucoma Sister   . Diabetes Maternal Grandmother   . Diabetes Other        MGGM  . Colon cancer Other        cousin  . Diabetes Maternal Aunt   . Diabetes Maternal Uncle   . Diabetes Paternal Aunt     Review of Systems  Constitutional: Negative for chills and fever.  Respiratory: Positive for shortness of breath. Negative for cough and wheezing.   Cardiovascular: Negative for chest pain, palpitations and leg swelling.  Neurological: Negative for dizziness, light-headedness and headaches.       Objective:   Vitals:   04/23/19 1040  BP: 118/74  Pulse: 66  Resp: 18  Temp: 98.5 F (36.9 C)  SpO2: 99%   BP Readings from Last 3 Encounters:  04/23/19 118/74  01/20/19 136/84  12/16/17 118/72   Wt Readings from Last 3 Encounters:  04/23/19 200 lb (90.7 kg)  01/20/19 198 lb (89.8 kg)  12/16/17 198 lb (89.8 kg)   Body mass index is 30.41 kg/m.   Physical Exam    Constitutional: Appears well-developed and well-nourished. No distress.  Head: Normocephalic and atraumatic.  Neck: Neck supple. No tracheal deviation present. No thyromegaly present.  No cervical lymphadenopathy Cardiovascular: Normal rate, regular rhythm and normal heart sounds.  No murmur heard. No carotid bruit .  No edema Pulmonary/Chest: Effort normal and breath sounds normal.  No respiratory distress. No has no wheezes. No rales.  Skin: Skin is warm and dry. Not diaphoretic.  Psychiatric: Normal mood and affect. Behavior is normal.      Assessment & Plan:    See Problem  List for Assessment and Plan of chronic medical problems.    This visit occurred during the SARS-CoV-2 public health emergency.  Safety protocols were in place, including screening questions prior to the visit, additional usage of staff PPE, and extensive cleaning of exam room while observing appropriate contact time as indicated for disinfecting solutions.

## 2019-04-23 ENCOUNTER — Encounter: Payer: Self-pay | Admitting: Internal Medicine

## 2019-04-23 ENCOUNTER — Ambulatory Visit (INDEPENDENT_AMBULATORY_CARE_PROVIDER_SITE_OTHER): Payer: Medicare PPO | Admitting: Internal Medicine

## 2019-04-23 ENCOUNTER — Other Ambulatory Visit: Payer: Self-pay

## 2019-04-23 ENCOUNTER — Ambulatory Visit (INDEPENDENT_AMBULATORY_CARE_PROVIDER_SITE_OTHER): Payer: Medicare PPO

## 2019-04-23 VITALS — BP 118/74 | HR 66 | Temp 98.5°F | Resp 18 | Ht 68.0 in | Wt 200.0 lb

## 2019-04-23 DIAGNOSIS — R0600 Dyspnea, unspecified: Secondary | ICD-10-CM

## 2019-04-23 DIAGNOSIS — R0609 Other forms of dyspnea: Secondary | ICD-10-CM

## 2019-04-23 DIAGNOSIS — I10 Essential (primary) hypertension: Secondary | ICD-10-CM | POA: Diagnosis not present

## 2019-04-23 NOTE — Assessment & Plan Note (Signed)
Acute Has been going on for several months-not getting worse, but persistent has shortness of breath with exertion only and some decreased stamina She is still exercising regularly and active, But she has slowed down with her walking and could be compensating She did notice some improvement after holding the hydrochlorothiazide so could be related to low BP-has been as low as 107/62 at home Need to rule out cardiac cause, pulmonary cause Hold hydrochlorothiazide and monitor BP at home EKG today: Sinus bradycardia at 58 bpm, possible LAE, otherwise normal EKG, no significant changes compared to EKG from 2015 Chest x-ray today Blood work from 3 months ago within normal limits and she did have the symptoms then so we will not repeat Referral to cardiology

## 2019-04-23 NOTE — Patient Instructions (Addendum)
An EKG was done today.    Have a chest xray downstairs.   Medications reviewed and updated.  Changes include :   Stop the hydrochlorothiazide  Monitor your BP at home  A referral was ordered for  cardiology     Someone will call you to schedule this.

## 2019-04-23 NOTE — Assessment & Plan Note (Signed)
Chronic Blood pressure is very good here today, but has been low on a couple of occasions at home, which has caused some fatigue ?  Contributing to dyspnea on exertion Discontinue hydrochlorothiazide 12.5 mg daily She will monitor BP at home Continue available on current dose

## 2019-04-26 ENCOUNTER — Encounter: Payer: Self-pay | Admitting: Internal Medicine

## 2019-05-01 ENCOUNTER — Encounter: Payer: Self-pay | Admitting: Cardiovascular Disease

## 2019-05-01 ENCOUNTER — Other Ambulatory Visit: Payer: Self-pay

## 2019-05-01 ENCOUNTER — Ambulatory Visit (INDEPENDENT_AMBULATORY_CARE_PROVIDER_SITE_OTHER): Payer: Medicare PPO | Admitting: Cardiovascular Disease

## 2019-05-01 VITALS — BP 122/78 | HR 60 | Ht 67.0 in | Wt 203.2 lb

## 2019-05-01 DIAGNOSIS — R0600 Dyspnea, unspecified: Secondary | ICD-10-CM

## 2019-05-01 DIAGNOSIS — R0609 Other forms of dyspnea: Secondary | ICD-10-CM

## 2019-05-01 NOTE — Patient Instructions (Signed)
Medication Instructions:  Your physician recommends that you continue on your current medications as directed. Please refer to the Current Medication list given to you today.  *If you need a refill on your cardiac medications before your next appointment, please call your pharmacy*  Lab Work: NONE   Testing/Procedures: Your physician has requested that you have an echocardiogram. Echocardiography is a painless test that uses sound waves to create images of your heart. It provides your doctor with information about the size and shape of your heart and how well your heart's chambers and valves are working. This procedure takes approximately one hour. There are no restrictions for this procedure. [place] [address]   Your physician has requested that you have a lexiscan myoview. For further information please visit [email or web address]. Please follow instruction sheet, as given.  Follow-Up: AS NEEDED   Other Instructions  Cardiac Nuclear Scan A cardiac nuclear scan is a test that is done to check the flow of blood to your heart. It is done when you are resting and when you are exercising. The test looks for problems such as:  Not enough blood reaching a portion of the heart.  The heart muscle not working as it should. You may need this test if:  You have heart disease.  You have had lab results that are not normal.  You have had heart surgery or a balloon procedure to open up blocked arteries (angioplasty).  You have chest pain.  You have shortness of breath. In this test, a special dye (tracer) is put into your bloodstream. The tracer will travel to your heart. A camera will then take pictures of your heart to see how the tracer moves through your heart. This test is usually done at a hospital and takes 2-4 hours. Tell a doctor about:  Any allergies you have.  All medicines you are taking, including vitamins, herbs, eye drops, creams, and over-the-counter  medicines.  Any problems you or family members have had with anesthetic medicines.  Any blood disorders you have.  Any surgeries you have had.  Any medical conditions you have.  Whether you are pregnant or may be pregnant. What are the risks? Generally, this is a safe test. However, problems may occur, such as:  Serious chest pain and heart attack. This is only a risk if the stress portion of the test is done.  Rapid heartbeat.  A feeling of warmth in your chest. This feeling usually does not last long.  Allergic reaction to the tracer. What happens before the test?  Ask your doctor about changing or stopping your normal medicines. This is important.  Follow instructions from your doctor about what you cannot eat or drink.  Remove your jewelry on the day of the test. What happens during the test?  An IV tube will be inserted into one of your veins.  Your doctor will give you a small amount of tracer through the IV tube.  You will wait for 20-40 minutes while the tracer moves through your bloodstream.  Your heart will be monitored with an electrocardiogram (ECG).  You will lie down on an exam table.  Pictures of your heart will be taken for about 15-20 minutes.  You may also have a stress test. For this test, one of these things may be done: ? You will be asked to exercise on a treadmill or a stationary bike. ? You will be given medicines that will make your heart work harder.  This is done if you are unable to exercise.  When blood flow to your heart has peaked, a tracer will again be given through the IV tube.  After 20-40 minutes, you will get back on the exam table. More pictures will be taken of your heart.  Depending on the tracer that is used, more pictures may need to be taken 3-4 hours later.  Your IV tube will be removed when the test is over. The test may vary among doctors and hospitals. What happens after the test?  Ask your doctor: ? Whether you  can return to your normal schedule, including diet, activities, and medicines. ? Whether you should drink more fluids. This will help to remove the tracer from your body. Drink enough fluid to keep your pee (urine) pale yellow.  Ask your doctor, or the department that is doing the test: ? When will my results be ready? ? How will I get my results? Summary  A cardiac nuclear scan is a test that is done to check the flow of blood to your heart.  Tell your doctor whether you are pregnant or may be pregnant.  Before the test, ask your doctor about changing or stopping your normal medicines. This is important.  Ask your doctor whether you can return to your normal activities. You may be asked to drink more fluids. This information is not intended to replace advice given to you by your health care provider. Make sure you discuss any questions you have with your health care provider. Document Revised: 04/16/2018 Document Reviewed: 06/10/2017 Elsevier Patient Education  [place].  Echocardiogram An echocardiogram is a procedure that uses painless sound waves (ultrasound) to produce an image of the heart. Images from an echocardiogram can provide important information about:  Signs of coronary artery disease (CAD).  Aneurysm detection. An aneurysm is a weak or damaged part of an artery wall that bulges out from the normal force of blood pumping through the body.  Heart size and shape. Changes in the size or shape of the heart can be associated with certain conditions, including heart failure, aneurysm, and CAD.  Heart muscle function.  Heart valve function.  Signs of a past heart attack.  Fluid buildup around the heart.  Thickening of the heart muscle.  A tumor or infectious growth around the heart valves. Tell a health care provider about:  Any allergies you have.  All medicines you are taking, including vitamins, herbs, eye drops, creams, and over-the-counter  medicines.  Any blood disorders you have.  Any surgeries you have had.  Any medical conditions you have.  Whether you are pregnant or may be pregnant. What are the risks? Generally, this is a safe procedure. However, problems may occur, including:  Allergic reaction to dye (contrast) that may be used during the procedure. What happens before the procedure? No specific preparation is needed. You may eat and drink normally. What happens during the procedure?   An IV tube may be inserted into one of your veins.  You may receive contrast through this tube. A contrast is an injection that improves the quality of the pictures from your heart.  A gel will be applied to your chest.  A wand-like tool (transducer) will be moved over your chest. The gel will help to transmit the sound waves from the transducer.  The sound waves will harmlessly bounce off of your heart to allow the heart images to be captured in real-time motion. The images will be recorded  on a computer. The procedure may vary among health care providers and hospitals. What happens after the procedure?  You may return to your normal, everyday life, including diet, activities, and medicines, unless your health care provider tells you not to do that. Summary  An echocardiogram is a procedure that uses painless sound waves (ultrasound) to produce an image of the heart.  Images from an echocardiogram can provide important information about the size and shape of your heart, heart muscle function, heart valve function, and fluid buildup around your heart.  You do not need to do anything to prepare before this procedure. You may eat and drink normally.  After the echocardiogram is completed, you may return to your normal, everyday life, unless your health care provider tells you not to do that. This information is not intended to replace advice given to you by your health care provider. Make sure you discuss any questions you  have with your health care provider. Document Revised: 04/17/2018 Document Reviewed: 01/28/2016 Elsevier Patient Education  [place].

## 2019-05-01 NOTE — Progress Notes (Signed)
Cardiology Office Note   Date:  05/18/2019   ID:  TAKEA CARLOW, DOB Mar 03, 1945, MRN [id number]  PCP:  [name], MD  Cardiologist:   Skeet Latch, MD   No chief complaint on file.   History of Present Illness: Sydney Robinson is a 74 y.o. female who is being seen today for the evaluation of exertional dyspnea at the request of Burns, Claudina Lick, MD.  She reports that her daughters have been telling her she has shortness of breath.  Ms. Joanie Robinson says she doesn't notice it but other people comment on her breathing being labored.  She is able to walk in the park and feels OK.  She has no chest pain or pressure.  She doesn't get much formal exercise.  In the past she walked on her treadmill daily.  She does house and yard work.  She has no lower extremity edema, orthopnea or PND.  She was having some lightheadedness but Dr. Quay Burow stopped her hydrochlorothiazide.  This seems to have helped.  She does not think it has affected her breathing.  She denies any palpitations.  Her only complaints are orthopedic discomforts.  She has been taking labetalol for 25 years and feels very well on it.   Past Medical History:  Diagnosis Date  . 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 Diverticulosis   . Hyperlipidemia   . Hypertension   . Murmur     Past Surgical History:  Procedure Laterality Date  . BREAST EXCISIONAL BIOPSY Bilateral 1970s  . COLONOSCOPY W/ POLYPECTOMY  1998    negative 2003 & 2008; 2013  . DILATION AND CURETTAGE OF UTERUS     x 3  . fibroidectomy     x3  . g2 p2    . hemorrhoidal banding  2011   Dr Earlean Shawl  . Neuroma Resected Foot     Right   . OVARIAN CYST REMOVAL    . ROTATOR CUFF REPAIR    . TONSILLECTOMY       Current Outpatient Medications  Medication Sig Dispense Refill  . Biotin 1 MG CAPS Take by mouth.    . Cetirizine HCl (ZYRTEC ALLERGY PO) Zyrtec    . Cholecalciferol (VITAMIN D3 PO) Take by mouth.    . clonazePAM (KLONOPIN) 0.5 MG tablet TAKE 1/2-1  TABLET AT BEDTIME AS NEEDED 30 tablet 2  . labetalol (NORMODYNE) 300 MG tablet Take 0.5 tablets (150 mg total) by mouth 2 (two) times daily. 180 tablet 3  . polycarbophil (FIBERCON) 625 MG tablet Take 625 mg by mouth daily.    . simvastatin (ZOCOR) 40 MG tablet TAKE 1 TABLET (40 MG TOTAL) BY MOUTH AT BEDTIME. 90 tablet 3   No current facility-administered medications for this visit.    Allergies:   Shellfish allergy    Social History:  The patient  reports that she has never smoked. She has never used smokeless tobacco. She reports that she does not drink alcohol or use drugs.   Family History:  The patient's family history includes Alzheimer's disease in her mother; Arthritis in her mother; Breast cancer in her sister; Breast cancer (age of onset: 32) in her sister; Colon cancer in an other family member; Diabetes in her maternal aunt, maternal grandmother, maternal uncle, mother, paternal aunt, and another family member; Diverticulitis in her sister; Glaucoma in her mother and sister; Heart attack in her maternal uncle; Heart attack (age of onset: 51) in her maternal aunt; Heart disease in  her sister; Heart failure in her mother; Stroke in her maternal grandfather; Stroke (age of onset: 75) in her paternal aunt; Uterine cancer in her paternal aunt.    ROS:  Please see the history of present illness.   Otherwise, review of systems are positive for none.   All other systems are reviewed and negative.    PHYSICAL EXAM: VS:  BP 122/78   Pulse 60   Ht 5\' 7"  (1.702 m)   Wt 203 lb 3.2 oz (92.2 kg)   SpO2 99%   BMI 31.83 kg/m  , BMI Body mass index is 31.83 kg/m. GENERAL:  Well appearing HEENT:  Pupils equal round and reactive, fundi not visualized, oral mucosa unremarkable NECK:  No jugular venous distention, waveform within normal limits, carotid upstroke brisk and symmetric, no bruits LUNGS:  Clear to auscultation bilaterally HEART:  RRR.  PMI not displaced or sustained,S1 and S2 within  normal limits, no S3, no S4, no clicks, no rubs, no murmurs ABD:  Flat, positive bowel sounds normal in frequency in pitch, no bruits, no rebound, no guarding, no midline pulsatile mass, no hepatomegaly, no splenomegaly EXT:  2 plus pulses throughout, no edema, no cyanosis no clubbing SKIN:  No rashes no nodules NEURO:  Cranial nerves II through XII grossly intact, motor grossly intact throughout PSYCH:  Cognitively intact, oriented to person place and time   EKG:  EKG is ordered today. The ekg ordered today demonstrates sinus rhythm.  Rate 60 bpm.   Recent Labs: 01/20/2019: ALT 7; BUN 10; Creatinine, Ser 1.04; Hemoglobin 13.8; Platelets 226.0; Potassium 3.9; Sodium 141; TSH 1.33    Lipid Panel    Component Value Date/Time   CHOL 198 01/20/2019 1127   CHOL 264 (H) 12/07/2013 1539   TRIG 105.0 01/20/2019 1127   TRIG 176 (H) 12/07/2013 1539   HDL 74.10 01/20/2019 1127   HDL 74 12/07/2013 1539   CHOLHDL 3 01/20/2019 1127   VLDL 21.0 01/20/2019 1127   LDLCALC 103 (H) 01/20/2019 1127   LDLCALC 155 (H) 12/07/2013 1539   LDLDIRECT 130.5 12/29/2012 0850      Wt Readings from Last 3 Encounters:  05/01/19 203 lb 3.2 oz (92.2 kg)  04/23/19 200 lb (90.7 kg)  01/20/19 198 lb (89.8 kg)      ASSESSMENT AND PLAN:  # Shortness of breath: No evidence of heart failure on exam.  She does have a family history of heart failure.  EKG does not show any evidence of prior infarct.  We will get an echo and a Lexiscan Myoview to better assess.  Overall, I think her symptoms are more due to deconditioning.  If the above are negative she will work on increasing her exercise back to 150 minutes weekly.  # Hypertension: Stable on labetalol.  # Hyperlipidemia: Continue simvastatin.  LDL 103 on 01/2019.   Current medicines are reviewed at length with the patient today.  The patient does not have concerns regarding medicines.  The following changes have been made:  no change  Labs/ tests ordered  today include:   Orders Placed This Encounter  Procedures  . MYOCARDIAL PERFUSION IMAGING  . EKG 12-Lead  . ECHOCARDIOGRAM COMPLETE     Disposition:   FU with Elnita Surprenant C. Oval Linsey, MD, [facility] as needed.     Signed, Moreen Piggott C. Oval Linsey, MD, [facility]  05/18/2019 9:47 PM    [place]

## 2019-05-04 ENCOUNTER — Telehealth: Payer: Self-pay | Admitting: Internal Medicine

## 2019-05-04 DIAGNOSIS — R0609 Other forms of dyspnea: Secondary | ICD-10-CM

## 2019-05-04 NOTE — Telephone Encounter (Signed)
Sent mychart message letting pt know referral was sent.

## 2019-05-04 NOTE — Telephone Encounter (Signed)
Referral ordered

## 2019-05-04 NOTE — Telephone Encounter (Signed)
New message:   Pt states she gives Dr. Quay Burow permission to send her to a lung specialists. She states she  Couldn't figure out how to reply to her message in [place]. Please advise.

## 2019-05-13 ENCOUNTER — Encounter (HOSPITAL_COMMUNITY): Payer: Self-pay | Admitting: *Deleted

## 2019-05-13 ENCOUNTER — Telehealth (HOSPITAL_COMMUNITY): Payer: Self-pay | Admitting: *Deleted

## 2019-05-13 NOTE — Telephone Encounter (Signed)
Left message on voicemail per DPR in reference to upcoming appointment scheduled on 05/20/2019 at 1045 with detailed instructions given per Myocardial Perfusion Study Information Sheet for the test. LM to arrive 15 minutes early, and that it is imperative to arrive on time for appointment to keep from having the test rescheduled. If you need to cancel or reschedule your appointment, please call the office within 24 hours of your appointment. Failure to do so may result in a cancellation of your appointment, and a $50 no show fee. Phone number given for call back for any questions.   Mychart letter sent with instructions.Mussa Groesbeck, [place]

## 2019-05-18 ENCOUNTER — Encounter: Payer: Self-pay | Admitting: Cardiovascular Disease

## 2019-05-20 ENCOUNTER — Ambulatory Visit (HOSPITAL_BASED_OUTPATIENT_CLINIC_OR_DEPARTMENT_OTHER): Payer: Medicare PPO

## 2019-05-20 ENCOUNTER — Other Ambulatory Visit: Payer: Self-pay

## 2019-05-20 ENCOUNTER — Ambulatory Visit (HOSPITAL_COMMUNITY): Payer: Medicare PPO | Attending: Internal Medicine

## 2019-05-20 DIAGNOSIS — R0600 Dyspnea, unspecified: Secondary | ICD-10-CM | POA: Diagnosis not present

## 2019-05-20 DIAGNOSIS — R0609 Other forms of dyspnea: Secondary | ICD-10-CM

## 2019-05-20 LAB — MYOCARDIAL PERFUSION IMAGING
LV dias vol: 58 mL (ref 46–106)
LV sys vol: 16 mL
Peak HR: 91 {beats}/min
Rest HR: 54 {beats}/min
SDS: 2
SRS: 0
SSS: 2
TID: 0.88

## 2019-05-20 MED ORDER — TECHNETIUM TC 99M TETROFOSMIN IV KIT
10.8000 | PACK | Freq: Once | INTRAVENOUS | Status: AC | PRN
  Administered 2019-05-20: 10.8 via INTRAVENOUS
  Filled 2019-05-20: qty 11

## 2019-05-20 MED ORDER — TECHNETIUM TC 99M TETROFOSMIN IV KIT
32.1000 | PACK | Freq: Once | INTRAVENOUS | Status: AC | PRN
  Administered 2019-05-20: 32.1 via INTRAVENOUS
  Filled 2019-05-20: qty 33

## 2019-05-20 MED ORDER — REGADENOSON 0.4 MG/5ML IV SOLN
0.4000 mg | Freq: Once | INTRAVENOUS | Status: AC
Start: 2019-05-20 — End: 2019-05-20
  Administered 2019-05-20: 0.4 mg via INTRAVENOUS

## 2019-05-21 ENCOUNTER — Encounter: Payer: Self-pay | Admitting: Internal Medicine

## 2019-05-21 ENCOUNTER — Ambulatory Visit (INDEPENDENT_AMBULATORY_CARE_PROVIDER_SITE_OTHER): Payer: Medicare PPO | Admitting: Internal Medicine

## 2019-05-21 VITALS — BP 116/80 | HR 85 | Temp 98.5°F | Ht 67.0 in | Wt 202.6 lb

## 2019-05-21 DIAGNOSIS — G4733 Obstructive sleep apnea (adult) (pediatric): Secondary | ICD-10-CM

## 2019-05-21 DIAGNOSIS — R0602 Shortness of breath: Secondary | ICD-10-CM | POA: Diagnosis not present

## 2019-05-21 DIAGNOSIS — I77810 Thoracic aortic ectasia: Secondary | ICD-10-CM | POA: Insufficient documentation

## 2019-05-21 DIAGNOSIS — I5189 Other ill-defined heart diseases: Secondary | ICD-10-CM | POA: Insufficient documentation

## 2019-05-21 MED ORDER — ALBUTEROL SULFATE HFA 108 (90 BASE) MCG/ACT IN AERS: 2.0000 | INHALATION_SPRAY | Freq: Four times a day (QID) | RESPIRATORY_TRACT | 5 refills | Status: DC | PRN

## 2019-05-21 NOTE — Patient Instructions (Signed)
The patient should have follow up scheduled with APP in 3 months.  Prior to next visit patient should have: Sleep study PFTs  I would recommend taking your clonazepam the night of your sleep study to make sure that you fall asleep.   Take the albuterol rescue inhaler every 4 to 6 hours as needed for wheezing or shortness of breath. You can also take it 15 minutes before exercise or exertional activity. Side effects include heart racing or pounding, jitters or anxiety. If you have a history of an irregular heart rhythm, it can make this worse. Can also give some patients a hard time sleeping.  To inhale the aerosol using an inhaler, follow these steps:  1. Remove the protective dust cap from the end of the mouthpiece. If the dust cap was not placed on the mouthpiece, check the mouthpiece for dirt or other objects. Be sure that the canister is fully and firmly inserted in the mouthpiece. 2. If you are using the inhaler for the first time or if you have not used the inhaler in more than 14 days, you will need to prime it. You may also need to prime the inhaler if it has been dropped. Ask your pharmacist or check the manufacturer's information if this happens. To prime the inhaler, shake it well and then press down on the canister 4 times to release 4 sprays into the air, away from your face. Be careful not to get albuterol in your eyes. 3. Shake the inhaler well. 4. Breathe out as completely as possible through your mouth. 4. Hold the canister with the mouthpiece on the bottom, facing you and the canister pointing upward. Place the open end of the mouthpiece into your mouth. Close your lips tightly around the mouthpiece. 6. Breathe in slowly and deeply through the mouthpiece.At the same time, press down once on the container to spray the medication into your mouth. 7. Try to hold your breath for 10 seconds. remove the inhaler, and breathe out slowly. 8. If you were told to use 2 puffs, wait 1  minute and then repeat steps 3-7. 9. Replace the protective cap on the inhaler. 10. Clean your inhaler regularly. Follow the manufacturer's directions carefully and ask your doctor or pharmacist if you have any questions about cleaning your inhaler.  Check the back of the inhaler to keep track of the total number of doses left on the inhaler.

## 2019-05-21 NOTE — Progress Notes (Signed)
[name]    [id number]    1945-12-17  Primary Care Physician:Burns, Claudina Lick, MD  Referring Physician: [name], MD [place],  [place] [address] Reason for Consultation: shortness of breath Date of Consultation: 05/21/2019  Chief complaint:   Chief Complaint  Patient presents with  . Consult    DOE about a year.       HPI: Sydney Robinson is a 74 y.o. woman who presents with worsening shortness of breath for the past year.   She notes that this is always with exertion, walking in the park, walking from parking lot to the building.  She mows the lawn and does some  She says it is more her family who notes she is short of breath and that is why she sought care. No associated cough, wheezing, chest tightness.   No childhood respiratory disease or allergies. She does have a shellfish allergy which started a few years ago with nausea/vomiting. She does have occasional environmental allergies. No headaches.   She was given an inhaler for her breathing once many years ago during wheezing episode.   No nocturnal dyspnea. No significant weight changes.  She had similar episodes when she was on HCTZ - feeling fatigued and noted her BP was low at the time. Feeling a little better since stopping diuretic. However, breathing hasn't changed since stopping HCTZ 2-3 weeks ago.   Had sleep study 25 years ago. Was told it was not conclusive. She notes trouble falling asleep - no specific problems but generalized worries that creep up at night. She and her husband slept in separate bedrooms because of the snoring. He passed away a year ago.   OBSTRUCTIVE SLEEP APNEA SCREENING  1.  Snoring?:  yes 2.  Tired?:  yes 3.  Observed apnea, stop breathing or choking/gasping during sleep?:  no 4.  Pressure. HTN history?  yes 5.  BMI more than 35 kg/m2? no 6.  Age more than 48 yrs?  yes 7.  Neck size larger than 17 in for female or 16 in for female?  no 8.  Gender =  Female?  no  Total:  4  For general population  OSA - Low Risk : Yes to 0 - 2 questions OSA - Intermediate Risk : Yes to 3 - 4 questions OSA - High Risk : Yes to 5 - 8 questions  or Yes to 2 or more of 4 STOP questions + female gender or Yes to 2 or more of 4 STOP questions + BMI > 35kg/m2  or Yes to 2 or more of 4 STOP questions + neck circumference 17 inches / 43cm in female or 16 inches / 41cm in female  References: Rinaldo Cloud al. Anesthesiology 2008; 108: 812-821,  Gabriel Cirri et al Br Dara Hoyer 2012; 108: [id number],  Gabriel Cirri et al J Clin Sleep Med Sept 2014.   Social history: Occupation: Exposures: Smoking history:  Social History   Occupational History  . Not on file  Tobacco Use  . Smoking status: Never Smoker  . Smokeless tobacco: Never Used  Substance and Sexual Activity  . Alcohol use: No    Alcohol/week: 0.0 standard drinks    Comment: Wine rarely  . Drug use: No  . Sexual activity: Not on file    Relevant family history:  Family History  Problem Relation Age of Onset  . Diabetes Mother   . Alzheimer's disease Mother   .  Heart failure Mother   . Glaucoma Mother   . Arthritis Mother   . Heart attack Maternal Aunt 59  . Stroke Paternal Aunt 56       cns aneurysm  . Uterine cancer Paternal Aunt   . Breast cancer Sister        2 sisters   . Heart disease Sister   . Diverticulitis Sister   . Breast cancer Sister 34  . Glaucoma Sister   . Diabetes Maternal Grandmother   . Diabetes Other        MGGM  . Colon cancer Other        cousin  . Diabetes Maternal Aunt   . Diabetes Maternal Uncle   . Heart attack Maternal Uncle   . Diabetes Paternal Aunt   . Stroke Maternal Grandfather     Past Medical History:  Diagnosis Date  . 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 Diverticulosis   . Hyperlipidemia   . Hypertension   . Murmur     Past Surgical History:  Procedure Laterality Date  . BREAST EXCISIONAL BIOPSY Bilateral 1970s  . COLONOSCOPY W/ POLYPECTOMY   1998    negative 2003 & 2008; 2013  . DILATION AND CURETTAGE OF UTERUS     x 3  . fibroidectomy     x3  . g2 p2    . hemorrhoidal banding  2011   Dr Earlean Shawl  . Neuroma Resected Foot     Right   . OVARIAN CYST REMOVAL    . ROTATOR CUFF REPAIR    . TONSILLECTOMY      Physical Exam: Blood pressure 116/80, pulse 85, temperature 98.5 F (36.9 C), temperature source Temporal, height 5\' 7"  (1.702 m), weight 202 lb 9.6 oz (91.9 kg), SpO2 100 %. Gen:      No acute distress ENT:  no nasal polyps, mucus membranes moist Lungs:    No increased respiratory effort, symmetric chest wall excursion, clear to auscultation bilaterally, no wheezes or crackles CV:         Regular rate and rhythm; no murmurs, rubs, or gallops.  No pedal edema Abd:      + bowel sounds; soft, non-tender; no distension MSK: no acute synovitis of DIP or PIP joints, no mechanics hands.  Skin:      Warm and dry; no rashes Neuro: normal speech, no focal facial asymmetry Psych: alert and oriented x3, normal mood and affect   Data Reviewed/Medical Decision Making:  Independent interpretation of tests: Imaging: . Review of patient's chest xray April 2021 images revealed no acute cardiopulmonary process. The patient's images have been independently reviewed by me.    PFTs: None on file  Labs:  Lab Results  Component Value Date   WBC 5.2 01/20/2019   HGB 13.8 01/20/2019   HCT 41.6 01/20/2019   MCV 88.0 01/20/2019   PLT 226.0 01/20/2019     Echocardiogram 05/20/2019 1. Left ventricular ejection fraction, by estimation, is 60 to 65%. The  left ventricle has normal function. The left ventricle has no regional  wall motion abnormalities. Left ventricular diastolic parameters are  consistent with Grade I diastolic  dysfunction (impaired relaxation).  2. Right ventricular systolic function is normal. The right ventricular  size is normal. Tricuspid regurgitation signal is inadequate for assessing  PA pressure.  3.  The mitral valve is abnormal. Trivial mitral valve regurgitation.  4. The aortic valve is tricuspid. Aortic valve regurgitation is not  visualized. Mild aortic valve sclerosis is  present, with no evidence of  aortic valve stenosis.  5. Aortic dilatation noted. 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  6. The inferior vena cava is normal in size with greater than 50%  respiratory variability, suggesting right atrial pressure of 3 mmHg.  Immunization status:  Immunization History  Administered Date(s) Administered  . Influenza Split 11/15/2010  . Influenza Whole 01/08/2001, 10/08/2007  . Influenza, High Dose Seasonal PF 11/01/2014, 10/08/2017, 08/26/2018  . Influenza, Seasonal, Injecte, Preservative Fre 12/03/2011  . Influenza,inj,Quad PF,6+ Mos 10/13/2013  . Influenza-Unspecified 10/14/2012, 11/01/2014, 10/05/2015, 10/08/2016  . PFIZER SARS-COV-2 Vaccination 02/24/2019, 03/06/2019  . Pneumococcal Conjugate-13 12/14/2014  . Pneumococcal Polysaccharide-23 12/23/2015  . Td 07/29/2008    . I reviewed prior external note(s) from cardiology, primary care . I reviewed the result(s) of the labs and imaging as noted above.  . I have ordered PFTs, sleep study  Assessment:  Shortness of breath HFpEF - Grade 1 diastolic dysfunction Possible OSA Allergic rhinitis  Plan/Recommendations: Differential for dyspnea includes asthma, untreated osa. Will proceed with trial of albuterol. Did inhaler teaching today. Given severity of snoring and sleepiness will proceed with sleep study - I recommend she take a sleeping aid since the last time she had difficulty falling asleep.   Continue cetirizine for rhinitis  We discussed disease management and progression at length today.   I spent 60 minutes in the care of this patient today including pre-charting, chart review, review of results, face-to-face care, coordination of care and communication with consultants  etc.).  Return to Care: Return in about 3 months (around 08/21/2019). She will follow up on results with APP and I will see her again after that.   Lenice Llamas, MD [facility] [place]  CC: [name], MD

## 2019-06-23 ENCOUNTER — Ambulatory Visit: Payer: Medicare PPO

## 2019-06-23 ENCOUNTER — Other Ambulatory Visit: Payer: Self-pay

## 2019-06-23 DIAGNOSIS — G4733 Obstructive sleep apnea (adult) (pediatric): Secondary | ICD-10-CM | POA: Diagnosis not present

## 2019-06-24 DIAGNOSIS — G4733 Obstructive sleep apnea (adult) (pediatric): Secondary | ICD-10-CM | POA: Diagnosis not present

## 2019-07-02 DIAGNOSIS — H25813 Combined forms of age-related cataract, bilateral: Secondary | ICD-10-CM | POA: Diagnosis not present

## 2019-07-02 DIAGNOSIS — H5213 Myopia, bilateral: Secondary | ICD-10-CM | POA: Diagnosis not present

## 2019-07-14 ENCOUNTER — Telehealth: Payer: Self-pay | Admitting: Internal Medicine

## 2019-07-14 NOTE — Telephone Encounter (Signed)
Home sleep study results reviewed.Showing mild obstructive sleep apnea.  Please set up as virtual visit with APP to further review and can discuss treatment options.Wyn Quaker, FNP

## 2019-07-14 NOTE — Telephone Encounter (Signed)
Spoke with pt, aware of results/recs.  Scheduled a televisit with BM on 7/13 at 3:00.  Nothing further needed at this time- will close encounter.

## 2019-07-14 NOTE — Telephone Encounter (Signed)
Brian,can you please advise on the home sleep study results . That were done on 06/23/19.

## 2019-07-21 ENCOUNTER — Other Ambulatory Visit: Payer: Self-pay

## 2019-07-21 ENCOUNTER — Ambulatory Visit (INDEPENDENT_AMBULATORY_CARE_PROVIDER_SITE_OTHER): Payer: Medicare PPO | Admitting: Pulmonary Disease

## 2019-07-21 ENCOUNTER — Encounter: Payer: Self-pay | Admitting: Pulmonary Disease

## 2019-07-21 DIAGNOSIS — R0600 Dyspnea, unspecified: Secondary | ICD-10-CM

## 2019-07-21 DIAGNOSIS — J45909 Unspecified asthma, uncomplicated: Secondary | ICD-10-CM | POA: Diagnosis not present

## 2019-07-21 DIAGNOSIS — Z7189 Other specified counseling: Secondary | ICD-10-CM | POA: Diagnosis not present

## 2019-07-21 DIAGNOSIS — G4733 Obstructive sleep apnea (adult) (pediatric): Secondary | ICD-10-CM | POA: Diagnosis not present

## 2019-07-21 DIAGNOSIS — R0609 Other forms of dyspnea: Secondary | ICD-10-CM

## 2019-07-21 NOTE — Patient Instructions (Addendum)
You were seen today by Lauraine Rinne, NP  for:   1. OSA (obstructive sleep apnea)  New CPAP start DME: [employer] Settings: APAP 5-15 Mask of choice  Please contact our office if you are not contacted by the DME company to schedule a appointment within the next 7 days  We recommend that you start using CPAP daily >>>Keep up the hard work using your device >>> Goal should be wearing this for the entire night that you are sleeping, at least 4 to 6 hours  Remember:   Do not drive or operate heavy machinery if tired or drowsy.   Please notify the supply company and office if you are unable to use your device regularly due to missing supplies or machine being broken.   Work on maintaining a healthy weight and following your recommended nutrition plan   Maintain proper daily exercise and movement   Maintaining proper use of your device can also help improve management of other chronic illnesses such as: Blood pressure, blood sugars, and weight management.   BiPAP/ CPAP Cleaning:  >>>Clean weekly, with Dawn soap, and bottle brush.  Set up to air dry. >>> Wipe mask out daily with wet wipe or towelette    2. Dyspnea on exertion 3. Reactive airway disease without complication, unspecified asthma severity, unspecified whether persistent  Complete the pulmonary function test as scheduled  4. Coordination of complex care  I am sorry for your frustrations regarding the follow-up regarding your sleep study from our office.  I have followed up with our clinical lead of our office to see if we can improve this process.  I would encourage you to contact cardiology regarding your concerns and their follow-up with you.  Follow Up:    Return in about 2 months (around 09/21/2019), or if symptoms worsen or fail to improve, for Follow up with Dr. Shearon Stalls.   Please do your part to reduce the spread of COVID-19:      Reduce your risk of any infection  and COVID19 by using the similar precautions  used for avoiding the common cold or flu:   Wash your hands often with soap and warm water for at least 20 seconds.  If soap and water are not readily available, use an alcohol-based hand sanitizer with at least 60% alcohol.   If coughing or sneezing, cover your mouth and nose by coughing or sneezing into the elbow areas of your shirt or coat, into a tissue or into your sleeve (not your hands).  WEAR A MASK when in public   Avoid shaking hands with others and consider head nods or verbal greetings only.  Avoid touching your eyes, nose, or mouth with unwashed hands.   Avoid close contact with people who are sick.  Avoid places or events with large numbers of people in one location, like concerts or sporting events.  If you have some symptoms but not all symptoms, continue to monitor at home and seek medical attention if your symptoms worsen.  If you are having a medical emergency, call 911.   [place] / e-Visit: [email or web address]         [facility]: [phone]  [facility]: [phone]                   [facility]: [phone]     It is flu season:   >>> Best ways to protect herself from the flu:  Receive the yearly flu vaccine, practice good hand hygiene washing with soap and also using hand sanitizer when available, eat a nutritious meals, get adequate rest, hydrate appropriately   Please contact the office if your symptoms worsen or you have concerns that you are not improving.   Thank you for choosing [facility] for your healthcare, and for allowing [place] to partner with you on your healthcare journey. I am thankful to be able to provide care to you today.   Sydney Quaker FNP-C     Sleep Apnea Sleep apnea affects breathing during sleep. It causes breathing to stop for a short time or to become shallow. It can also  increase the risk of:  Heart attack.  Stroke.  Being very overweight (obese).  Diabetes.  Heart failure.  Irregular heartbeat. The goal of treatment is to help you breathe normally again. What are the causes? There are three kinds of sleep apnea:  Obstructive sleep apnea. This is caused by a blocked or collapsed airway.  Central sleep apnea. This happens when the brain does not send the right signals to the muscles that control breathing.  Mixed sleep apnea. This is a combination of obstructive and central sleep apnea. The most common cause of this condition is a collapsed or blocked airway. This can happen if:  Your throat muscles are too relaxed.  Your tongue and tonsils are too large.  You are overweight.  Your airway is too small. What increases the risk?  Being overweight.  Smoking.  Having a small airway.  Being older.  Being female.  Drinking alcohol.  Taking medicines to calm yourself (sedatives or tranquilizers).  Having family members with the condition. What are the signs or symptoms?  Trouble staying asleep.  Being sleepy or tired during the day.  Getting angry a lot.  Loud snoring.  Headaches in the morning.  Not being able to focus your mind (concentrate).  Forgetting things.  Less interest in sex.  Mood swings.  Personality changes.  Feelings of sadness (depression).  Waking up a lot during the night to pee (urinate).  Dry mouth.  Sore throat. How is this diagnosed?  Your medical history.  A physical exam.  A test that is done when you are sleeping (sleep study). The test is most often done in a sleep lab but may also be done at home. How is this treated?   Sleeping on your side.  Using a medicine to get rid of mucus in your nose (decongestant).  Avoiding the use of alcohol, medicines to help you relax, or certain pain medicines (narcotics).  Losing weight, if needed.  Changing your diet.  Not  smoking.  Using a machine to open your airway while you sleep, such as: ? An oral appliance. This is a mouthpiece that shifts your lower jaw forward. ? A CPAP device. This device blows air through a mask when you breathe out (exhale). ? An EPAP device. This has valves that you put in each nostril. ? A BPAP device. This device blows air through a mask when you breathe in (inhale) and breathe out.  Having surgery if other treatments do not work. It is important to get treatment for sleep apnea. Without treatment, it can lead to:  High blood pressure.  Coronary artery disease.  In men, not being able to have an erection (impotence).  Reduced thinking ability. Follow these instructions at home: Lifestyle  Make changes that your doctor recommends.  Eat a healthy diet.  Lose weight if needed.  Avoid alcohol, medicines to help you relax, and some pain medicines.  Do not use any products that contain nicotine or tobacco, such as cigarettes, e-cigarettes, and chewing tobacco. If you need help quitting, ask your doctor. General instructions  Take over-the-counter and prescription medicines only as told by your doctor.  If you were given a machine to use while you sleep, use it only as told by your doctor.  If you are having surgery, make sure to tell your doctor you have sleep apnea. You may need to bring your device with you.  Keep all follow-up visits as told by your doctor. This is important. Contact a doctor if:  The machine that you were given to use during sleep bothers you or does not seem to be working.  You do not get better.  You get worse. Get help right away if:  Your chest hurts.  You have trouble breathing in enough air.  You have an uncomfortable feeling in your back, arms, or stomach.  You have trouble talking.  One side of your body feels weak.  A part of your face is hanging down. These symptoms may be an emergency. Do not wait to see if the symptoms  will go away. Get medical help right away. Call your local emergency services (911 in the [place] Do not drive yourself to the hospital. Summary  This condition affects breathing during sleep.  The most common cause is a collapsed or blocked airway.  The goal of treatment is to help you breathe normally while you sleep. This information is not intended to replace advice given to you by your health care provider. Make sure you discuss any questions you have with your health care provider. Document Revised: 10/11/2017 Document Reviewed: 08/20/2017 Elsevier Patient Education  [place].

## 2019-07-21 NOTE — Addendum Note (Signed)
Addended by: Doroteo Glassman D on: 07/21/2019 02:46 PM   Modules accepted: Orders

## 2019-07-21 NOTE — Assessment & Plan Note (Signed)
Patient found to have mild obstructive sleep apnea Discussed multiple treatment therapy options for patient including conservative management with weight loss, and reducing BMI  Patient would like to proceed forward with CPAP therapy  Plan: Patient to start CPAP therapy 6 to 8-week follow-up with our office We will send message to clinical lead reporting that patient would like improvement in the process of calling to report sleep study results

## 2019-07-21 NOTE — Progress Notes (Signed)
Virtual Visit via Telephone Note  I connected with Sydney Robinson on 07/21/19 at  3:00 PM EDT by telephone and verified that I am speaking with the correct person using two identifiers.  Location: Patient: Home Provider: [facility] - [address], [place], [place], [place] [address]   I discussed the limitations, risks, security and privacy concerns of performing an evaluation and management service by telephone and the availability of in person appointments. I also discussed with the patient that there may be a patient responsible charge related to this service. The patient expressed understanding and agreed to proceed.  Patient consented to consult via telephone: Yes People present and their role in pt care: Pt     History of Present Illness:  74 year old female never smoker followed in our office for obstructive sleep apnea  Past medical history: Hyperlipidemia, hypertension, prediabetes, insomnia Smoking history: Never smoker Maintenance: None  Patient of Dr. Shearon Stalls  Chief complaint: HST results    74 year old female never smoker initially referred to our office in May/2021 to establish with Dr. Shearon Stalls plan of care from that office was for the patient to continue cetirizine for management of her rhinitis.  Suspected that patient's shortness of breath and dyspnea may be related to either potentially asthma or untreated obstructive sleep apnea.  Patient presents today to review home sleep study results.  Patient is currently scheduled with our office on 8/13 for evaluation of her breathing for a pulmonary function test.  She is scheduled for follow-up after that.  We will discuss this.  Patient has recently had a echocardiogram managed by cardiology they will repeat this in 1 year.  Patient also had a Lexiscan stress test that was read as normal.  She is followed in cardiology by Dr. Oval Linsey.  Patient reporting that she is frustrated with the lack of response  back after getting multiple tests done.  She reports that he was frustrated as she had to contact our office to receive her sleep study results.  She is also frustrated that the cardiology team has not contacted her to discuss the results of her test over the phone or scheduled a follow-up in person.  We will review this today.  Observations/Objective:  06/23/2019-home sleep study-AHI 8.6, SaO2 low 82%  05/20/19 - Lexiscan   Nuclear stress EF: 72%.  There was no ST segment deviation noted during stress.  No T wave inversion was noted during stress.  The study is normal.  This is a low risk study.  The left ventricular ejection fraction is hyperdynamic (>65%).   Social History   Tobacco Use  Smoking Status Never Smoker  Smokeless Tobacco Never Used   Immunization History  Administered Date(s) Administered  . Influenza Split 11/15/2010  . Influenza Whole 01/08/2001, 10/08/2007  . Influenza, High Dose Seasonal PF 11/01/2014, 10/08/2017, 08/26/2018  . Influenza, Seasonal, Injecte, Preservative Fre 12/03/2011  . Influenza,inj,Quad PF,6+ Mos 10/13/2013  . Influenza-Unspecified 10/14/2012, 11/01/2014, 10/05/2015, 10/08/2016  . PFIZER SARS-COV-2 Vaccination 02/24/2019, 03/06/2019  . Pneumococcal Conjugate-13 12/14/2014  . Pneumococcal Polysaccharide-23 12/23/2015  . Td 07/29/2008      Assessment and Plan:  RAD (reactive airway disease) Patient with symptoms of shortness of breath and dyspnea Carries diagnosis of reactive airways disease Has rescue inhaler  Plan: Obtain pulmonary function testing as scheduled Continue Zyrtec We will push back follow-up appointment for 6 to 8 weeks since she also needs to have follow-up for CPAP therapy  Dyspnea on exertion Given her continued  symptoms of shortness of breath on exertion I believe it is reasonable for her to obtain her pulmonary function testing   It is reassuring that her cardiology evaluation was stable without acute  concerns.  Plan: Keep scheduled appointment for pulmonary function testing  OSA (obstructive sleep apnea) Patient found to have mild obstructive sleep apnea Discussed multiple treatment therapy options for patient including conservative management with weight loss, and reducing BMI  Patient would like to proceed forward with CPAP therapy  Plan: Patient to start CPAP therapy 6 to 8-week follow-up with our office We will send message to clinical lead reporting that patient would like improvement in the process of calling to report sleep study results  Coordination of complex care Reviewed patient's echocardiogram as well as Lexiscan as well as sleep study results today with her.  Patient still frustrated regarding the follow-up process from both cardiology as well as our office.  I reported to her that I will follow-up with our clinical lead reporting that she feels that there should be an improvement with following up with home sleep study results.  I have encouraged her to contact cardiology regarding her additional questions as well as concerns regarding their follow-up with her following her echocardiogram and Lexiscan   Follow Up Instructions:  Return in about 2 months (around 09/21/2019), or if symptoms worsen or fail to improve, for Follow up with Dr. Shearon Stalls.   I discussed the assessment and treatment plan with the patient. The patient was provided an opportunity to ask questions and all were answered. The patient agreed with the plan and demonstrated an understanding of the instructions.   The patient was advised to call back or seek an in-person evaluation if the symptoms worsen or if the condition fails to improve as anticipated.  I provided 25 minutes of non-face-to-face time during this encounter.   Lauraine Rinne, NP

## 2019-07-21 NOTE — Assessment & Plan Note (Signed)
Given her continued symptoms of shortness of breath on exertion I believe it is reasonable for her to obtain her pulmonary function testing   It is reassuring that her cardiology evaluation was stable without acute concerns.  Plan: Keep scheduled appointment for pulmonary function testing

## 2019-07-21 NOTE — Assessment & Plan Note (Signed)
Patient with symptoms of shortness of breath and dyspnea Carries diagnosis of reactive airways disease Has rescue inhaler  Plan: Obtain pulmonary function testing as scheduled Continue Zyrtec We will push back follow-up appointment for 6 to 8 weeks since she also needs to have follow-up for CPAP therapy

## 2019-07-21 NOTE — Assessment & Plan Note (Signed)
Reviewed patient's echocardiogram as well as Lexiscan as well as sleep study results today with her.  Patient still frustrated regarding the follow-up process from both cardiology as well as our office.  I reported to her that I will follow-up with our clinical lead reporting that she feels that there should be an improvement with following up with home sleep study results.  I have encouraged her to contact cardiology regarding her additional questions as well as concerns regarding their follow-up with her following her echocardiogram and Lexiscan

## 2019-07-26 ENCOUNTER — Ambulatory Visit: Payer: Self-pay

## 2019-07-26 ENCOUNTER — Other Ambulatory Visit: Payer: Self-pay

## 2019-07-26 ENCOUNTER — Encounter: Payer: Self-pay | Admitting: Emergency Medicine

## 2019-07-26 ENCOUNTER — Ambulatory Visit
Admission: EM | Admit: 2019-07-26 | Discharge: 2019-07-26 | Disposition: A | Discharge status: Home / Self Care | Payer: Medicare PPO

## 2019-07-26 DIAGNOSIS — L309 Dermatitis, unspecified: Secondary | ICD-10-CM

## 2019-07-26 MED ORDER — HYDROXYZINE HCL 25 MG PO TABS: 25.0000 mg | ORAL_TABLET | Freq: Four times a day (QID) | ORAL | 0 refills | Status: DC

## 2019-07-26 MED ORDER — METHYLPREDNISOLONE SODIUM SUCC 125 MG IJ SOLR
125.0000 mg | Freq: Once | INTRAMUSCULAR | Status: AC
  Administered 2019-07-26: 125 mg via INTRAMUSCULAR

## 2019-07-26 MED ORDER — TRIAMCINOLONE ACETONIDE 0.5 % EX OINT
1.0000 | TOPICAL_OINTMENT | Freq: Two times a day (BID) | CUTANEOUS | 0 refills | Status: DC
Start: 2019-07-26 — End: 2019-09-23

## 2019-07-26 NOTE — ED Triage Notes (Signed)
Pt presents to [facility] for assessment of rash to chest and neck starting Monday of this week.  Denies exposure.  Concerned for poison ivy.

## 2019-07-26 NOTE — ED Provider Notes (Signed)
[facility]    CSN: [id number] Arrival date & time: 07/26/19  1038      History   Chief Complaint Chief Complaint  Patient presents with  . Rash    HPI Sydney Robinson is a 74 y.o. female with history of hypertension, obesity presenting for rash.  States this occurred Monday, has slowly improved with topical treatments.  Endorsing pruritus.  States it feels similar to previous poison ivy flares.  Unsure if she got poison ivy on her.  Has washed all clothing and bedding.  No cough, difficulty breathing, arthralgias, myalgias, chest pain.  Denies recent change in topical products or diet/medications.   Past Medical History:  Diagnosis Date  . 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 Diverticulosis   . Hyperlipidemia   . Hypertension   . Murmur     Patient Active Problem List   Diagnosis Date Noted  . OSA (obstructive sleep apnea) 07/21/2019  . Coordination of complex care 07/21/2019  . Grade I diastolic dysfunction 51/70/0174  . Ascending aorta dilatation ([place]), mild 05/21/2019  . Dyspnea on exertion 04/23/2019  . Primary osteoarthritis of right hip 01/28/2018  . Right knee pain 12/16/2017  . Insomnia 12/24/2016  . Obesity 07/04/2016  . Internal hemorrhoids 07/04/2016  . Lower back pain 12/23/2015  . Prediabetes 11/04/2012  . RAD (reactive airway disease) 12/03/2011  . DIVERTICULOSIS, COLON 08/12/2009  . Hyperlipidemia 06/04/2006  . Essential hypertension 06/04/2006  . History of colonic polyps 06/04/2006    Past Surgical History:  Procedure Laterality Date  . BREAST EXCISIONAL BIOPSY Bilateral 1970s  . COLONOSCOPY W/ POLYPECTOMY  1998    negative 2003 & 2008; 2013  . DILATION AND CURETTAGE OF UTERUS     x 3  . fibroidectomy     x3  . g2 p2    . hemorrhoidal banding  2011   Dr Earlean Shawl  . Neuroma Resected Foot     Right   . OVARIAN CYST REMOVAL    . ROTATOR CUFF REPAIR    . TONSILLECTOMY      OB History   No obstetric history on file.       Home Medications    Prior to Admission medications   Medication Sig Start Date End Date Taking? Authorizing Provider  Cetirizine HCl (ZYRTEC ALLERGY PO) Zyrtec   Yes [provider]  fexofenadine (ALLEGRA) 60 MG tablet Take 60 mg by mouth 2 (two) times daily.   Yes [provider]  albuterol (VENTOLIN HFA) 108 (90 Base) MCG/ACT inhaler Inhale 2 puffs into the lungs every 6 (six) hours as needed for wheezing or shortness of breath. 05/21/19   Spero Geralds, MD  Biotin 1 MG CAPS Take by mouth.    [provider]  Cholecalciferol (VITAMIN D3 PO) Take by mouth.    [provider]  clonazePAM (KLONOPIN) 0.5 MG tablet TAKE 1/2-1 TABLET AT BEDTIME AS NEEDED Patient not taking: Reported on 05/21/2019 01/20/19   [name], MD  hydrOXYzine (ATARAX/VISTARIL) 25 MG tablet Take 1 tablet (25 mg total) by mouth every 6 (six) hours. 07/26/19   Hall-Potvin, [place], PA-C  labetalol (NORMODYNE) 300 MG tablet Take 0.5 tablets (150 mg total) by mouth 2 (two) times daily. 01/20/19   [name], MD  polycarbophil (FIBERCON) 625 MG tablet Take 625 mg by mouth daily.    [provider]  simvastatin (ZOCOR) 40 MG tablet TAKE 1 TABLET (40 MG TOTAL) BY MOUTH AT BEDTIME. 01/20/19  [name], MD  triamcinolone ointment (KENALOG) 0.5 % Apply 1 application topically 2 (two) times daily. 07/26/19   Hall-Potvin, [place], PA-C    Family History Family History  Problem Relation Age of Onset  . Diabetes Mother   . Alzheimer's disease Mother   . Heart failure Mother   . Glaucoma Mother   . Arthritis Mother   . Heart attack Maternal Aunt 59  . Stroke Paternal Aunt 42       cns aneurysm  . Uterine cancer Paternal Aunt   . Breast cancer Sister        2 sisters   . Heart disease Sister   . Diverticulitis Sister   . Breast cancer Sister 74  . Glaucoma Sister   . Diabetes Maternal Grandmother   . Diabetes Other        MGGM  . Colon cancer Other         cousin  . Diabetes Maternal Aunt   . Diabetes Maternal Uncle   . Heart attack Maternal Uncle   . Diabetes Paternal Aunt   . Stroke Maternal Grandfather     Social History Social History   Tobacco Use  . Smoking status: Never Smoker  . Smokeless tobacco: Never Used  Substance Use Topics  . Alcohol use: No    Alcohol/week: 0.0 standard drinks    Comment: Wine rarely  . Drug use: No     Allergies   Shellfish allergy   Review of Systems As per HPI   Physical Exam Triage Vital Signs ED Triage Vitals  Enc Vitals Group     BP      Pulse      Resp      Temp      Temp src      SpO2      Weight      Height      Head Circumference      Peak Flow      Pain Score      Pain Loc      Pain Edu?      Excl. in [place]?    No data found.  Updated Vital Signs BP 123/81 (BP Location: Left Arm)   Pulse 63   Temp 98.4 F (36.9 C) (Oral)   Resp 18   SpO2 99%   Visual Acuity Right Eye Distance:   Left Eye Distance:   Bilateral Distance:    Right Eye Near:   Left Eye Near:    Bilateral Near:     Physical Exam Constitutional:      General: She is not in acute distress. HENT:     Head: Normocephalic and atraumatic.  Eyes:     General: No scleral icterus.    Pupils: Pupils are equal, round, and reactive to light.  Cardiovascular:     Rate and Rhythm: Normal rate.  Pulmonary:     Effort: Pulmonary effort is normal.  Skin:    Coloration: Skin is not jaundiced or pale.     Findings: Rash present.     Comments: Flat, erythematous rash noted to chest, extending up to right side of neck.  Nontender.  No vesicles, discharge.  Neurological:     Mental Status: She is alert and oriented to person, place, and time.      UC Treatments / Results  Labs (all labs ordered are listed, but only abnormal results are displayed) Labs Reviewed - No data to display  EKG   Radiology No  results found.  Procedures Procedures (including critical care time)  Medications  Ordered in UC Medications  methylPREDNISolone sodium succinate (SOLU-MEDROL) 125 mg/2 mL injection 125 mg (125 mg Intramuscular Given 07/26/19 1108)    Initial Impression / Assessment and Plan / UC Course  I have reviewed the triage vital signs and the nursing notes.  Pertinent labs & imaging results that were available during my care of the patient were reviewed by me and considered in my medical decision making (see chart for details).     Patient does have dermatitis over her right chest that is extending into neck.  No lymphadenopathy, systemic symptoms as mentioned in HPI.  We will treat supportively as outlined below.  Given Solu-Medrol which patient has tolerated well in the past: Tolerated well here.  Will follow up with her PCP for further evaluation if needed.  Return precautions discussed, pt verbalized understanding and is agreeable to plan. Final Clinical Impressions(s) / UC Diagnoses   Final diagnoses:  Dermatitis     Discharge Instructions     Keep skin clean - may use gentle soaps without perfumes/dyes. Avoid hot water (showers, baths) as this can further dry out and irritate skin. Pat skin dry as rubbing can irritate and tear skin. Apply triamcinolone 2 times daily.    ED Prescriptions    Medication Sig Dispense Auth. Provider   triamcinolone ointment (KENALOG) 0.5 % Apply 1 application topically 2 (two) times daily. 30 g Hall-Potvin, [place], PA-C   hydrOXYzine (ATARAX/VISTARIL) 25 MG tablet Take 1 tablet (25 mg total) by mouth every 6 (six) hours. 12 tablet Hall-Potvin, [place], PA-C     PDMP not reviewed this encounter.   Hall-Potvin, [place], [place] 07/26/19 1122

## 2019-07-26 NOTE — ED Notes (Signed)
Patient able to ambulate independently  

## 2019-07-26 NOTE — Discharge Instructions (Addendum)
Keep skin clean - may use gentle soaps without perfumes/dyes. Avoid hot water (showers, baths) as this can further dry out and irritate skin. Pat skin dry as rubbing can irritate and tear skin. Apply triamcinolone 2 times daily. 

## 2019-08-03 ENCOUNTER — Telehealth: Payer: Self-pay | Admitting: Internal Medicine

## 2019-08-03 NOTE — Telephone Encounter (Signed)
LMTCB- WHEN SHE CALLED BACK SCHEDULE WITH APP

## 2019-08-04 NOTE — Telephone Encounter (Signed)
LMTCB x2 for pt 

## 2019-08-05 ENCOUNTER — Encounter (HOSPITAL_COMMUNITY): Payer: Self-pay

## 2019-08-06 NOTE — Telephone Encounter (Signed)
LMTCB x3 for pt. We have attempted to contact pt several times with no success or call back from pt. Per triage protocol, message will be closed.   

## 2019-08-18 ENCOUNTER — Other Ambulatory Visit (HOSPITAL_COMMUNITY)
Admission: RE | Admit: 2019-08-18 | Discharge: 2019-08-18 | Disposition: A | Discharge status: Home / Self Care | Payer: Medicare PPO | Source: Ambulatory Visit | Attending: Internal Medicine | Admitting: Internal Medicine

## 2019-08-18 DIAGNOSIS — Z01812 Encounter for preprocedural laboratory examination: Secondary | ICD-10-CM | POA: Diagnosis not present

## 2019-08-18 DIAGNOSIS — Z20822 Contact with and (suspected) exposure to covid-19: Secondary | ICD-10-CM | POA: Diagnosis not present

## 2019-08-18 DIAGNOSIS — G4733 Obstructive sleep apnea (adult) (pediatric): Secondary | ICD-10-CM | POA: Diagnosis not present

## 2019-08-18 LAB — SARS CORONAVIRUS 2 (TAT 6-24 HRS): SARS Coronavirus 2: NEGATIVE

## 2019-08-21 ENCOUNTER — Other Ambulatory Visit: Payer: Self-pay

## 2019-08-21 ENCOUNTER — Ambulatory Visit: Payer: Medicare PPO | Admitting: Adult Health

## 2019-08-21 ENCOUNTER — Ambulatory Visit (INDEPENDENT_AMBULATORY_CARE_PROVIDER_SITE_OTHER): Payer: Medicare PPO | Admitting: Internal Medicine

## 2019-08-21 ENCOUNTER — Ambulatory Visit: Payer: Medicare PPO | Admitting: Primary Care

## 2019-08-21 DIAGNOSIS — R0602 Shortness of breath: Secondary | ICD-10-CM

## 2019-08-21 NOTE — Progress Notes (Signed)
Full PFT completed today ? ?

## 2019-09-18 DIAGNOSIS — G4733 Obstructive sleep apnea (adult) (pediatric): Secondary | ICD-10-CM | POA: Diagnosis not present

## 2019-09-22 LAB — PULMONARY FUNCTION TEST
DL/VA % pred: 111 %
DL/VA: 4.49 ml/min/mmHg/L
DLCO cor % pred: 85 %
DLCO cor: 18.24 ml/min/mmHg
DLCO unc % pred: 85 %
DLCO unc: 18.24 ml/min/mmHg
FEF 25-75 Post: 2.87 L/sec
FEF 25-75 Pre: 2.18 L/sec
FEF2575-%Change-Post: 31 %
FEF2575-%Pred-Post: 156 %
FEF2575-%Pred-Pre: 119 %
FEV1-%Change-Post: 7 %
FEV1-%Pred-Post: 103 %
FEV1-%Pred-Pre: 96 %
FEV1-Post: 2.14 L
FEV1-Pre: 1.99 L
FEV1FVC-%Change-Post: 4 %
FEV1FVC-%Pred-Pre: 104 %
FEV6-%Change-Post: 4 %
FEV6-%Pred-Post: 98 %
FEV6-%Pred-Pre: 95 %
FEV6-Post: 2.53 L
FEV6-Pre: 2.43 L
FEV6FVC-%Change-Post: 1 %
FEV6FVC-%Pred-Post: 103 %
FEV6FVC-%Pred-Pre: 102 %
FVC-%Change-Post: 2 %
FVC-%Pred-Post: 95 %
FVC-%Pred-Pre: 92 %
FVC-Post: 2.53 L
FVC-Pre: 2.46 L
Post FEV1/FVC ratio: 85 %
Post FEV6/FVC ratio: 100 %
Pre FEV1/FVC ratio: 81 %
Pre FEV6/FVC Ratio: 99 %
RV % pred: 77 %
RV: 1.83 L
TLC % pred: 76 %
TLC: 4.22 L

## 2019-09-23 ENCOUNTER — Encounter: Payer: Self-pay | Admitting: Internal Medicine

## 2019-09-23 ENCOUNTER — Ambulatory Visit (INDEPENDENT_AMBULATORY_CARE_PROVIDER_SITE_OTHER): Payer: Medicare PPO | Admitting: Internal Medicine

## 2019-09-23 ENCOUNTER — Other Ambulatory Visit: Payer: Self-pay

## 2019-09-23 VITALS — BP 110/70 | HR 77 | Temp 98.0°F | Wt 200.0 lb

## 2019-09-23 DIAGNOSIS — G4733 Obstructive sleep apnea (adult) (pediatric): Secondary | ICD-10-CM

## 2019-09-23 DIAGNOSIS — Z9989 Dependence on other enabling machines and devices: Secondary | ICD-10-CM

## 2019-09-23 DIAGNOSIS — J3089 Other allergic rhinitis: Secondary | ICD-10-CM | POA: Diagnosis not present

## 2019-09-23 NOTE — Progress Notes (Signed)
Sydney Robinson    [id number]    07/07/1945  Primary Care Physician:Burns, Claudina Lick, MD Date of Appointment: 09/23/2019 Established Patient Visit  Chief complaint:   Chief Complaint  Patient presents with  . Follow-up    review the results of the PFT and the DL today.  having trouble with the mask on the cpap.  some nights it will fit good and sometimes it leaks air.      HPI: Sydney Robinson is a 74 y.o. with shortness of breath and OSA who presents for follow up.   Interval Updates: Has been walking for the past 3 months every day. Breathing feels improved somewhat. Has been wearing CPAP. Sleep download reviewed today. Excellent adherence more than 7.5 nights 100% of nights with complete suppression of AHI and minimal leak. She still has some rhinitis and takes allegra daily. Also seeing ophtho for cataracts.  Also here for PFT follow up  I have reviewed the patient's family social and past medical history and updated as appropriate.   Past Medical History:  Diagnosis Date  . 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 Diverticulosis   . Hyperlipidemia   . Hypertension   . Murmur     Past Surgical History:  Procedure Laterality Date  . BREAST EXCISIONAL BIOPSY Bilateral 1970s  . COLONOSCOPY W/ POLYPECTOMY  1998    negative 2003 & 2008; 2013  . DILATION AND CURETTAGE OF UTERUS     x 3  . fibroidectomy     x3  . g2 p2    . hemorrhoidal banding  2011   Dr Earlean Shawl  . Neuroma Resected Foot     Right   . OVARIAN CYST REMOVAL    . ROTATOR CUFF REPAIR    . TONSILLECTOMY      Family History  Problem Relation Age of Onset  . Diabetes Mother   . Alzheimer's disease Mother   . Heart failure Mother   . Glaucoma Mother   . Arthritis Mother   . Heart attack Maternal Aunt 59  . Stroke Paternal Aunt 29       cns aneurysm  . Uterine cancer Paternal Aunt   . Breast cancer Sister        2 sisters   . Heart disease Sister   . Diverticulitis Sister   . Breast cancer  Sister 77  . Glaucoma Sister   . Diabetes Maternal Grandmother   . Diabetes Other        MGGM  . Colon cancer Other        cousin  . Diabetes Maternal Aunt   . Diabetes Maternal Uncle   . Heart attack Maternal Uncle   . Diabetes Paternal Aunt   . Stroke Maternal Grandfather     Social History   Occupational History  . Not on file  Tobacco Use  . Smoking status: Never Smoker  . Smokeless tobacco: Never Used  Substance and Sexual Activity  . Alcohol use: No    Alcohol/week: 0.0 standard drinks    Comment: Wine rarely  . Drug use: No  . Sexual activity: Not on file     Physical Exam: Blood pressure 110/70, pulse 77, temperature 98 F (36.7 C), temperature source Oral, weight 200 lb (90.7 kg), SpO2 98 %.  Gen:      No acute distress Lungs:    No increased respiratory effort, symmetric chest wall excursion, clear to auscultation bilaterally, no wheezes  or crackles CV:         Regular rate and rhythm; no murmurs, rubs, or gallops.  No pedal edema   Data Reviewed: Imaging: I have personally reviewed the chest xray April 2021. No acute process.   PFTs:  PFT Results Latest Ref Rng & Units 08/21/2019  FVC-Pre L 2.46  FVC-Predicted Pre % 92  FVC-Post L 2.53  FVC-Predicted Post % 95  Pre FEV1/FVC % % 81  Post FEV1/FCV % % 85  FEV1-Pre L 1.99  FEV1-Predicted Pre % 96  FEV1-Post L 2.14  DLCO uncorrected ml/min/mmHg 18.24  DLCO UNC% % 85  DLCO corrected ml/min/mmHg 18.24  DLCO COR %Predicted % 85  DLVA Predicted % 111  TLC L 4.22  TLC % Predicted % 76  RV % Predicted % 77   I have personally reviewed the patient's PFTs and there is no airflow limitation and mild restriction to ventilation likely secondary to body habitus.  Echocardiogram 05/20/2019 1. Left ventricular ejection fraction, by estimation, is 60 to 65%. The  left ventricle has normal function. The left ventricle has no regional  wall motion abnormalities. Left ventricular diastolic parameters are   consistent with Grade I diastolic  dysfunction (impaired relaxation).  2. Right ventricular systolic function is normal. The right ventricular  size is normal. Tricuspid regurgitation signal is inadequate for assessing  PA pressure.  3. The mitral valve is abnormal. Trivial mitral valve regurgitation.  4. The aortic valve is tricuspid. Aortic valve regurgitation is not  visualized. Mild aortic valve sclerosis is present, with no evidence of  aortic valve stenosis.  5. Aortic dilatation noted. 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  6. The inferior vena cava is normal in size with greater than 50%  respiratory variability, suggesting right atrial pressure of 3 mmHg.  Labs: Lab Results  Component Value Date   WBC 5.2 01/20/2019   HGB 13.8 01/20/2019   HCT 41.6 01/20/2019   MCV 88.0 01/20/2019   PLT 226.0 01/20/2019    Immunization status: Immunization History  Administered Date(s) Administered  . Influenza Split 11/15/2010  . Influenza Whole 01/08/2001, 10/08/2007  . Influenza, High Dose Seasonal PF 11/01/2014, 10/08/2017, 08/26/2018  . Influenza, Seasonal, Injecte, Preservative Fre 12/03/2011  . Influenza,inj,Quad PF,6+ Mos 10/13/2013  . Influenza-Unspecified 10/14/2012, 11/01/2014, 10/05/2015, 10/08/2016  . PFIZER SARS-COV-2 Vaccination 02/24/2019, 03/06/2019  . Pneumococcal Conjugate-13 12/14/2014  . Pneumococcal Polysaccharide-23 12/23/2015  . Td 07/29/2008    Assessment:  Shortness of Breath with mild restriction to ventilation OSA on CPAP Allergic Rhinitis   Plan/Recommendations: Continue Auto CPAP. She has excellent adherence and suppression of her AHI. She does benefit from CPAP therapy. Improved dyspnea, continue walking. Mild restriction to ventilation on PFTs but without crackles or hypoxemia. Doubt ILD.   Still has some rhinitis. Taking allegra. Start flonase.    Return to Care: Return in about 6 months (around  03/22/2020).   Lenice Llamas, MD Pulmonary and [place]

## 2019-09-23 NOTE — Patient Instructions (Signed)
The patient should have follow up scheduled with myself in 6 months.   Continue CPAP. Doing a great job! Keep up with the regular walking.   Flonase - 1 spray on each side of your nose twice a day for first week, then 1 spray on each side.   Instructions for use:  If you also use a saline nasal spray or rinse, use that first.  Position the head with the chin slightly tucked. Use the right hand to spray into the left nostril and the right hand to spray into the left nostril.   Point the bottle away from the septum of your nose (cartilage that divides the two sides of your nose).   Hold the nostril closed on the opposite side from where you will spray  Spray once and gently sniff to pull the medicine into the higher parts of your nose.  Don't sniff too hard as the medicine will drain down the back of your throat instead.  Repeat with a second spray on the same side if prescribed.  Repeat on the other side of your nose.

## 2019-10-18 DIAGNOSIS — G4733 Obstructive sleep apnea (adult) (pediatric): Secondary | ICD-10-CM | POA: Diagnosis not present

## 2019-10-20 DIAGNOSIS — G4733 Obstructive sleep apnea (adult) (pediatric): Secondary | ICD-10-CM | POA: Diagnosis not present

## 2019-10-26 DIAGNOSIS — H25813 Combined forms of age-related cataract, bilateral: Secondary | ICD-10-CM | POA: Diagnosis not present

## 2019-11-18 DIAGNOSIS — H2511 Age-related nuclear cataract, right eye: Secondary | ICD-10-CM | POA: Diagnosis not present

## 2019-11-18 DIAGNOSIS — H25811 Combined forms of age-related cataract, right eye: Secondary | ICD-10-CM | POA: Diagnosis not present

## 2019-11-18 DIAGNOSIS — G4733 Obstructive sleep apnea (adult) (pediatric): Secondary | ICD-10-CM | POA: Diagnosis not present

## 2019-11-20 ENCOUNTER — Other Ambulatory Visit: Payer: Self-pay | Admitting: Internal Medicine

## 2019-11-20 DIAGNOSIS — Z1231 Encounter for screening mammogram for malignant neoplasm of breast: Secondary | ICD-10-CM

## 2019-11-24 DIAGNOSIS — G4733 Obstructive sleep apnea (adult) (pediatric): Secondary | ICD-10-CM | POA: Diagnosis not present

## 2019-12-18 DIAGNOSIS — G4733 Obstructive sleep apnea (adult) (pediatric): Secondary | ICD-10-CM | POA: Diagnosis not present

## 2020-01-04 ENCOUNTER — Ambulatory Visit
Admission: RE | Admit: 2020-01-04 | Discharge: 2020-01-04 | Disposition: A | Discharge status: Home / Self Care | Payer: Medicare PPO | Source: Ambulatory Visit | Attending: Internal Medicine | Admitting: Internal Medicine

## 2020-01-04 ENCOUNTER — Other Ambulatory Visit: Payer: Self-pay

## 2020-01-04 DIAGNOSIS — Z1231 Encounter for screening mammogram for malignant neoplasm of breast: Secondary | ICD-10-CM

## 2020-01-18 DIAGNOSIS — G4733 Obstructive sleep apnea (adult) (pediatric): Secondary | ICD-10-CM | POA: Diagnosis not present

## 2020-01-27 ENCOUNTER — Other Ambulatory Visit: Payer: Self-pay

## 2020-01-27 NOTE — Patient Instructions (Addendum)
Blood work was ordered.    An ultrasound of your carotid arteries was ordered.  Someone will call you to schedule an appointment.   No immunization administered today.    Medications changes include :   none    Please followup in 1 year   Health Maintenance, Female Adopting a healthy lifestyle and getting preventive care are important in promoting health and wellness. Ask your health care provider about:  The right schedule for you to have regular tests and exams.  Things you can do on your own to prevent diseases and keep yourself healthy. What should I know about diet, weight, and exercise? Eat a healthy diet  Eat a diet that includes plenty of vegetables, fruits, low-fat dairy products, and lean protein.  Do not eat a lot of foods that are high in solid fats, added sugars, or sodium.   Maintain a healthy weight Body mass index (BMI) is used to identify weight problems. It estimates body fat based on height and weight. Your health care provider can help determine your BMI and help you achieve or maintain a healthy weight. Get regular exercise Get regular exercise. This is one of the most important things you can do for your health. Most adults should:  Exercise for at least 150 minutes each week. The exercise should increase your heart rate and make you sweat (moderate-intensity exercise).  Do strengthening exercises at least twice a week. This is in addition to the moderate-intensity exercise.  Spend less time sitting. Even light physical activity can be beneficial. Watch cholesterol and blood lipids Have your blood tested for lipids and cholesterol at 73 years of age, then have this test every 5 years. Have your cholesterol levels checked more often if:  Your lipid or cholesterol levels are high.  You are older than 73 years of age.  You are at high risk for heart disease. What should I know about cancer screening? Depending on your health history and family  history, you may need to have cancer screening at various ages. This may include screening for:  Breast cancer.  Cervical cancer.  Colorectal cancer.  Skin cancer.  Lung cancer. What should I know about heart disease, diabetes, and high blood pressure? Blood pressure and heart disease  High blood pressure causes heart disease and increases the risk of stroke. This is more likely to develop in people who have high blood pressure readings, are of African descent, or are overweight.  Have your blood pressure checked: ? Every 3-5 years if you are 70-63 years of age. ? Every year if you are 42 years old or older. Diabetes Have regular diabetes screenings. This checks your fasting blood sugar level. Have the screening done:  Once every three years after age 53 if you are at a normal weight and have a low risk for diabetes.  More often and at a younger age if you are overweight or have a high risk for diabetes. What should I know about preventing infection? Hepatitis B If you have a higher risk for hepatitis B, you should be screened for this virus. Talk with your health care provider to find out if you are at risk for hepatitis B infection. Hepatitis C Testing is recommended for:  Everyone born from [birth year] through 1963.  Anyone with known risk factors for hepatitis C. Sexually transmitted infections (STIs)  Get screened for STIs, including gonorrhea and chlamydia, if: ? You are sexually active and are younger than 73 years of age. ? You  are older than 73 years of age and your health care provider tells you that you are at risk for this type of infection. ? Your sexual activity has changed since you were last screened, and you are at increased risk for chlamydia or gonorrhea. Ask your health care provider if you are at risk.  Ask your health care provider about whether you are at high risk for HIV. Your health care provider may recommend a prescription medicine to help prevent HIV  infection. If you choose to take medicine to prevent HIV, you should first get tested for HIV. You should then be tested every 3 months for as long as you are taking the medicine. Pregnancy  If you are about to stop having your period (premenopausal) and you may become pregnant, seek counseling before you get pregnant.  Take 400 to 800 micrograms (mcg) of folic acid every day if you become pregnant.  Ask for birth control (contraception) if you want to prevent pregnancy. Osteoporosis and menopause Osteoporosis is a disease in which the bones lose minerals and strength with aging. This can result in bone fractures. If you are 49 years old or older, or if you are at risk for osteoporosis and fractures, ask your health care provider if you should:  Be screened for bone loss.  Take a calcium or vitamin D supplement to lower your risk of fractures.  Be given hormone replacement therapy (HRT) to treat symptoms of menopause. Follow these instructions at home: Lifestyle  Do not use any products that contain nicotine or tobacco, such as cigarettes, e-cigarettes, and chewing tobacco. If you need help quitting, ask your health care provider.  Do not use street drugs.  Do not share needles.  Ask your health care provider for help if you need support or information about quitting drugs. Alcohol use  Do not drink alcohol if: ? Your health care provider tells you not to drink. ? You are pregnant, may be pregnant, or are planning to become pregnant.  If you drink alcohol: ? Limit how much you use to 0-1 drink a day. ? Limit intake if you are breastfeeding.  Be aware of how much alcohol is in your drink. In the [place] one drink equals one 12 oz bottle of beer (355 mL), one 5 oz glass of wine (148 mL), or one 1 oz glass of hard liquor (44 mL). General instructions  Schedule regular health, dental, and eye exams.  Stay current with your vaccines.  Tell your health care provider if: ? You  often feel depressed. ? You have ever been abused or do not feel safe at home. Summary  Adopting a healthy lifestyle and getting preventive care are important in promoting health and wellness.  Follow your health care provider's instructions about healthy diet, exercising, and getting tested or screened for diseases.  Follow your health care provider's instructions on monitoring your cholesterol and blood pressure. This information is not intended to replace advice given to you by your health care provider. Make sure you discuss any questions you have with your health care provider. Document Revised: 12/18/2017 Document Reviewed: 12/18/2017 Elsevier Patient Education  2021 [employer].

## 2020-01-27 NOTE — Progress Notes (Signed)
Subjective:    Patient ID: Sydney Robinson, female    DOB: 01-Dec-1945, 75 y.o.   MRN: [id number]   This visit occurred during the SARS-CoV-2 public health emergency.  Safety protocols were in place, including screening questions prior to the visit, additional usage of staff PPE, and extensive cleaning of exam room while observing appropriate contact time as indicated for disinfecting solutions.    HPI She is here for a physical exam.   She has mild OSA and has been started on cpap.  She does not like it much.    She had cataract surgery.    Medications and allergies reviewed with patient and updated if appropriate.  Patient Active Problem List   Diagnosis Date Noted  . OSA (obstructive sleep apnea) 07/21/2019  . Coordination of complex care 07/21/2019  . Grade I diastolic dysfunction [id number]  . Ascending aorta dilatation ([place]), mild 05/21/2019  . Dyspnea on exertion 04/23/2019  . Primary osteoarthritis of right hip 01/28/2018  . Right knee pain 12/16/2017  . Insomnia 12/24/2016  . Obesity 07/04/2016  . Internal hemorrhoids 07/04/2016  . Lower back pain 12/23/2015  . Prediabetes 11/04/2012  . RAD (reactive airway disease) 12/03/2011  . DIVERTICULOSIS, COLON 08/12/2009  . Hyperlipidemia 06/04/2006  . Essential hypertension 06/04/2006  . History of colonic polyps 06/04/2006    Current Outpatient Medications on File Prior to Visit  Medication Sig Dispense Refill  . albuterol (VENTOLIN HFA) 108 (90 Base) MCG/ACT inhaler Inhale 2 puffs into the lungs every 6 (six) hours as needed for wheezing or shortness of breath. 18 g 5  . Biotin 1 MG CAPS Take by mouth.    . Cholecalciferol (VITAMIN D3 PO) Take 1 tablet by mouth as needed.     . clonazePAM (KLONOPIN) 0.5 MG tablet TAKE 1/2-1 TABLET AT BEDTIME AS NEEDED 30 tablet 2  . fexofenadine (ALLEGRA) 60 MG tablet Take 60 mg by mouth 2 (two) times daily.    Marland Kitchen labetalol (NORMODYNE) 300 MG tablet Take 0.5 tablets (150 mg total)  by mouth 2 (two) times daily. 180 tablet 3  . omega-3 acid ethyl esters (LOVAZA) 1 g capsule Take 1 g by mouth daily.    . polycarbophil (FIBERCON) 625 MG tablet Take 625 mg by mouth daily.    . simvastatin (ZOCOR) 40 MG tablet TAKE 1 TABLET (40 MG TOTAL) BY MOUTH AT BEDTIME. 90 tablet 3   No current facility-administered medications on file prior to visit.    Past Medical History:  Diagnosis Date  . 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 Diverticulosis   . Hyperlipidemia   . Hypertension   . Murmur     Past Surgical History:  Procedure Laterality Date  . BREAST EXCISIONAL BIOPSY Bilateral 1970s  . CATARACT EXTRACTION Right   . COLONOSCOPY W/ POLYPECTOMY  1998    negative 2003 & 2008; 2013  . DILATION AND CURETTAGE OF UTERUS     x 3  . fibroidectomy     x3  . g2 p2    . hemorrhoidal banding  2011   Dr Earlean Shawl  . Neuroma Resected Foot     Right   . OVARIAN CYST REMOVAL    . ROTATOR CUFF REPAIR    . TONSILLECTOMY      Social History   Socioeconomic History  . Marital status: Widowed    Spouse name: Not on file  . Number of children: Not on file  . Years of education: Not on file  .  Highest education level: Not on file  Occupational History  . Not on file  Tobacco Use  . Smoking status: Never Smoker  . Smokeless tobacco: Never Used  Substance and Sexual Activity  . Alcohol use: No    Alcohol/week: 0.0 standard drinks    Comment: Wine rarely  . Drug use: No  . Sexual activity: Not on file  Other Topics Concern  . Not on file  Social History Narrative  . Not on file   Social Determinants of Health   Financial Resource Strain: Not on file  Food Insecurity: Not on file  Transportation Needs: Not on file  Physical Activity: Not on file  Stress: Not on file  Social Connections: Not on file    Family History  Problem Relation Age of Onset  . Diabetes Mother   . Alzheimer's disease Mother   . Heart failure Mother   . Glaucoma Mother   . Arthritis Mother    . Heart attack Maternal Aunt 59  . Stroke Paternal Aunt 20       cns aneurysm  . Uterine cancer Paternal Aunt   . Breast cancer Sister        2 sisters   . Heart disease Sister   . Diverticulitis Sister   . Breast cancer Sister 67  . Glaucoma Sister   . Diabetes Maternal Grandmother   . Diabetes Other        MGGM  . Colon cancer Other        cousin  . Diabetes Maternal Aunt   . Diabetes Maternal Uncle   . Heart attack Maternal Uncle   . Diabetes Paternal Aunt   . Stroke Maternal Grandfather     Review of Systems  Constitutional: Negative for chills and fever.  Eyes: Negative for visual disturbance.  Respiratory: Negative for cough, shortness of breath and wheezing.   Cardiovascular: Positive for palpitations (when she turns onto her right side at night). Negative for chest pain and leg swelling.  Gastrointestinal: Negative for abdominal pain, blood in stool, constipation, diarrhea and nausea.       Occ gerd  Genitourinary: Negative for dysuria and hematuria.  Musculoskeletal: Positive for back pain (lower back - ? related to hip). Negative for arthralgias.  Neurological: Negative for light-headedness and headaches.  Psychiatric/Behavioral: Negative for dysphoric mood. The patient is not nervous/anxious.        Objective:   Vitals:   01/28/20 0859  BP: 122/74  Pulse: 62  Temp: 98.2 F (36.8 C)  SpO2: 99%   Filed Weights   01/28/20 0859  Weight: 196 lb (88.9 kg)   Body mass index is 30.7 kg/m.  BP Readings from Last 3 Encounters:  01/28/20 122/74  09/23/19 110/70  07/26/19 123/81    Wt Readings from Last 3 Encounters:  01/28/20 196 lb (88.9 kg)  09/23/19 200 lb (90.7 kg)  05/21/19 202 lb 9.6 oz (91.9 kg)     Physical Exam Constitutional: She appears well-developed and well-nourished. No distress.  HENT:  Head: Normocephalic and atraumatic.  Right Ear: External ear normal. Normal ear canal and TM Left Ear: External ear normal.  Normal ear canal and  TM Mouth/Throat: Oropharynx is clear and moist.  Eyes: Conjunctivae and EOM are normal.  Neck: Neck supple. No tracheal deviation present. No thyromegaly present.  R carotid bruit  Cardiovascular: Normal rate, regular rhythm and normal heart sounds.   No murmur heard.  No edema. Pulmonary/Chest: Effort normal and breath sounds normal. No respiratory  distress. She has no wheezes. She has no rales.  Breast: deferred   Abdominal: Soft. She exhibits no distension. There is no tenderness.  Lymphadenopathy: She has no cervical adenopathy.  Skin: Skin is warm and dry. She is not diaphoretic.  Psychiatric: She has a normal mood and affect. Her behavior is normal.        Assessment & Plan:   Physical exam: Screening blood work    ordered Immunizations  had Flu, had covid booster, discussed tdap, had first shingrix Colonoscopy  Up to date  Mammogram  Up to date  46  - Dr Radene Knee, up to date Dexa  Up to date  - due in a couple of weeks Eye exams  Up to date  Exercise  None - walks in warm weather Weight  Stressed weight loss Substance abuse   none      See Problem List for Assessment and Plan of chronic medical problems.

## 2020-01-28 ENCOUNTER — Encounter: Payer: Self-pay | Admitting: Internal Medicine

## 2020-01-28 ENCOUNTER — Ambulatory Visit (INDEPENDENT_AMBULATORY_CARE_PROVIDER_SITE_OTHER): Payer: Medicare PPO | Admitting: Internal Medicine

## 2020-01-28 ENCOUNTER — Other Ambulatory Visit: Payer: Self-pay

## 2020-01-28 VITALS — BP 122/74 | HR 62 | Temp 98.2°F | Ht 67.0 in | Wt 196.0 lb

## 2020-01-28 DIAGNOSIS — G4700 Insomnia, unspecified: Secondary | ICD-10-CM

## 2020-01-28 DIAGNOSIS — Z6830 Body mass index (BMI) 30.0-30.9, adult: Secondary | ICD-10-CM | POA: Diagnosis not present

## 2020-01-28 DIAGNOSIS — R0989 Other specified symptoms and signs involving the circulatory and respiratory systems: Secondary | ICD-10-CM | POA: Diagnosis not present

## 2020-01-28 DIAGNOSIS — I10 Essential (primary) hypertension: Secondary | ICD-10-CM | POA: Diagnosis not present

## 2020-01-28 DIAGNOSIS — R7303 Prediabetes: Secondary | ICD-10-CM

## 2020-01-28 DIAGNOSIS — E7849 Other hyperlipidemia: Secondary | ICD-10-CM

## 2020-01-28 DIAGNOSIS — Z0001 Encounter for general adult medical examination with abnormal findings: Secondary | ICD-10-CM | POA: Diagnosis not present

## 2020-01-28 DIAGNOSIS — E6609 Other obesity due to excess calories: Secondary | ICD-10-CM | POA: Diagnosis not present

## 2020-01-28 DIAGNOSIS — Z0000 Encounter for general adult medical examination without abnormal findings: Secondary | ICD-10-CM

## 2020-01-28 LAB — COMPREHENSIVE METABOLIC PANEL
ALT: 8 U/L (ref 0–35)
AST: 12 U/L (ref 0–37)
Albumin: 4.7 g/dL (ref 3.5–5.2)
Alkaline Phosphatase: 55 U/L (ref 39–117)
BUN: 12 mg/dL (ref 6–23)
CO2: 29 mEq/L (ref 19–32)
Calcium: 9.9 mg/dL (ref 8.4–10.5)
Chloride: 107 mEq/L (ref 96–112)
Creatinine, Ser: 1.05 mg/dL (ref 0.40–1.20)
GFR: 52.88 mL/min — ABNORMAL LOW (ref >60.00)
Glucose, Bld: 96 mg/dL (ref 70–99)
Potassium: 4.1 mEq/L (ref 3.5–5.1)
Sodium: 141 mEq/L (ref 135–145)
Total Bilirubin: 1 mg/dL (ref 0.2–1.2)
Total Protein: 7.1 g/dL (ref 6.0–8.3)

## 2020-01-28 LAB — LIPID PANEL
Cholesterol: 214 mg/dL — ABNORMAL HIGH (ref 0–200)
HDL: 79.2 mg/dL (ref >39.00)
LDL Cholesterol: 114 mg/dL — ABNORMAL HIGH (ref 0–99)
NonHDL: 135.17
Total CHOL/HDL Ratio: 3
Triglycerides: 106 mg/dL (ref 0.0–149.0)
VLDL: 21.2 mg/dL (ref 0.0–40.0)

## 2020-01-28 LAB — CBC WITH DIFFERENTIAL/PLATELET
Basophils Absolute: 0 10*3/uL (ref 0.0–0.1)
Basophils Relative: 0.2 % (ref 0.0–3.0)
Eosinophils Absolute: 0 10*3/uL (ref 0.0–0.7)
Eosinophils Relative: 0.9 % (ref 0.0–5.0)
HCT: 41.2 % (ref 36.0–46.0)
Hemoglobin: 13.8 g/dL (ref 12.0–15.0)
Lymphocytes Relative: 40.1 % (ref 12.0–46.0)
Lymphs Abs: 2 10*3/uL (ref 0.7–4.0)
MCHC: 33.4 g/dL (ref 30.0–36.0)
MCV: 86.5 fl (ref 78.0–100.0)
Monocytes Absolute: 0.4 10*3/uL (ref 0.1–1.0)
Monocytes Relative: 8.2 % (ref 3.0–12.0)
Neutro Abs: 2.5 10*3/uL (ref 1.4–7.7)
Neutrophils Relative %: 50.6 % (ref 43.0–77.0)
Platelets: 206 10*3/uL (ref 150.0–400.0)
RBC: 4.76 Mil/uL (ref 3.87–5.11)
RDW: 13.5 % (ref 11.5–15.5)
WBC: 4.9 10*3/uL (ref 4.0–10.5)

## 2020-01-28 LAB — HEMOGLOBIN A1C: Hgb A1c MFr Bld: 5.6 % (ref 4.6–6.5)

## 2020-01-28 LAB — TSH: TSH: 2.17 u[IU]/mL (ref 0.35–4.50)

## 2020-01-28 NOTE — Assessment & Plan Note (Signed)
Chronic Check a1c Low sugar / carb diet Stressed regular exercise  

## 2020-01-28 NOTE — Assessment & Plan Note (Addendum)
Chronic intermittent Continue clonazepam 0.25-0.5 mg at night as needed - takes 2-3 times a week

## 2020-01-28 NOTE — Assessment & Plan Note (Signed)
Chronic Check lipid panel  Continue simvastatin 40 mg  Regular exercise and healthy diet encouraged  

## 2020-01-28 NOTE — Assessment & Plan Note (Signed)
Acute r carotid bruit Carotid US ordered

## 2020-01-28 NOTE — Assessment & Plan Note (Signed)
Chronic BP well controlled Continue labetalol 150 mg BID cmp

## 2020-01-28 NOTE — Assessment & Plan Note (Signed)
Chronic Stressed weight loss Stressed regular exercise [employer]

## 2020-02-08 ENCOUNTER — Other Ambulatory Visit: Payer: Self-pay

## 2020-02-08 ENCOUNTER — Ambulatory Visit (HOSPITAL_COMMUNITY)
Admission: RE | Admit: 2020-02-08 | Discharge: 2020-02-08 | Disposition: A | Discharge status: Home / Self Care | Payer: Medicare PPO | Source: Ambulatory Visit | Attending: Internal Medicine | Admitting: Internal Medicine

## 2020-02-08 DIAGNOSIS — R0989 Other specified symptoms and signs involving the circulatory and respiratory systems: Secondary | ICD-10-CM | POA: Diagnosis not present

## 2020-02-15 ENCOUNTER — Other Ambulatory Visit: Payer: Self-pay | Admitting: Internal Medicine

## 2020-02-16 ENCOUNTER — Other Ambulatory Visit: Payer: Self-pay | Admitting: Internal Medicine

## 2020-02-18 ENCOUNTER — Ambulatory Visit (INDEPENDENT_AMBULATORY_CARE_PROVIDER_SITE_OTHER): Payer: Medicare PPO

## 2020-02-18 ENCOUNTER — Other Ambulatory Visit: Payer: Self-pay

## 2020-02-18 VITALS — BP 130/80 | HR 72 | Temp 98.2°F | Ht 67.0 in | Wt 198.2 lb

## 2020-02-18 DIAGNOSIS — Z0000 Encounter for general adult medical examination without abnormal findings: Secondary | ICD-10-CM | POA: Diagnosis not present

## 2020-02-18 DIAGNOSIS — G4733 Obstructive sleep apnea (adult) (pediatric): Secondary | ICD-10-CM | POA: Diagnosis not present

## 2020-02-18 NOTE — Progress Notes (Signed)
Subjective:   Sydney Robinson is a 75 y.o. female who presents for Medicare Annual (Subsequent) preventive examination.  Review of Systems    No ROS. Medicare Wellness Visit. Cardiac Risk Factors include: advanced age (>45men, >86 women);dyslipidemia;family history of premature cardiovascular disease;hypertension;obesity (BMI >30kg/m2)     Objective:    Today's Vitals   02/18/20 1403  BP: 130/80  Pulse: 72  Temp: 98.2 F (36.8 C)  SpO2: 99%  Weight: 198 lb 3.2 oz (89.9 kg)  Height: 5\' 7"  (1.702 m)  PainSc: 0-No pain   Body mass index is 31.04 kg/m.  Advanced Directives 02/18/2020 05/12/2016  Does Patient Have a Medical Advance Directive? Yes No  Type of Paramedic of [place];Living will -  Does patient want to make changes to medical advance directive? No - Patient declined -  Copy of [place] in Chart? No - copy requested -  Would patient like information on creating a medical advance directive? - Yes (ED - Information included in AVS)    Current Medications (verified) Outpatient Encounter Medications as of 02/18/2020  Medication Sig  . albuterol (VENTOLIN HFA) 108 (90 Base) MCG/ACT inhaler Inhale 2 puffs into the lungs every 6 (six) hours as needed for wheezing or shortness of breath.  . Biotin 1 MG CAPS Take by mouth.  . Cholecalciferol (VITAMIN D3 PO) Take 1 tablet by mouth as needed.   . clonazePAM (KLONOPIN) 0.5 MG tablet TAKE 1/2-1 TABLET AT BEDTIME AS NEEDED  . fexofenadine (ALLEGRA) 60 MG tablet Take 60 mg by mouth 2 (two) times daily.  Marland Kitchen labetalol (NORMODYNE) 300 MG tablet TAKE HALF A TABLETS (150 MG TOTAL) BY MOUTH 2 (TWO) TIMES DAILY.  Marland Kitchen omega-3 acid ethyl esters (LOVAZA) 1 g capsule Take 1 g by mouth daily.  . polycarbophil (FIBERCON) 625 MG tablet Take 625 mg by mouth daily.  . simvastatin (ZOCOR) 40 MG tablet TAKE 1 TABLET BY MOUTH EVERYDAY AT BEDTIME   No facility-administered encounter medications on file as  of 02/18/2020.    Allergies (verified) Shellfish allergy   History: Past Medical History:  Diagnosis Date  . 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 Diverticulosis   . Hyperlipidemia   . Hypertension   . Murmur    Past Surgical History:  Procedure Laterality Date  . BREAST EXCISIONAL BIOPSY Bilateral 1970s  . CATARACT EXTRACTION Right   . COLONOSCOPY W/ POLYPECTOMY  1998    negative 2003 & 2008; 2013  . DILATION AND CURETTAGE OF UTERUS     x 3  . fibroidectomy     x3  . g2 p2    . hemorrhoidal banding  2011   Dr Earlean Shawl  . Neuroma Resected Foot     Right   . OVARIAN CYST REMOVAL    . ROTATOR CUFF REPAIR    . TONSILLECTOMY     Family History  Problem Relation Age of Onset  . Diabetes Mother   . Alzheimer's disease Mother   . Heart failure Mother   . Glaucoma Mother   . Arthritis Mother   . Heart attack Maternal Aunt 59  . Stroke Paternal Aunt 34       cns aneurysm  . Uterine cancer Paternal Aunt   . Breast cancer Sister        2 sisters   . Heart disease Sister   . Diverticulitis Sister   . Breast cancer Sister 39  . Glaucoma Sister   . Diabetes  Maternal Grandmother   . Diabetes Other        MGGM  . Colon cancer Other        cousin  . Diabetes Maternal Aunt   . Diabetes Maternal Uncle   . Heart attack Maternal Uncle   . Diabetes Paternal Aunt   . Stroke Maternal Grandfather    Social History   Socioeconomic History  . Marital status: Widowed    Spouse name: Not on file  . Number of children: Not on file  . Years of education: Not on file  . Highest education level: Not on file  Occupational History  . Not on file  Tobacco Use  . Smoking status: Never Smoker  . Smokeless tobacco: Never Used  Substance and Sexual Activity  . Alcohol use: No    Alcohol/week: 0.0 standard drinks    Comment: Wine rarely  . Drug use: No  . Sexual activity: Not on file  Other Topics Concern  . Not on file  Social History Narrative  . Not on file   Social  Determinants of Health   Financial Resource Strain: Low Risk   . Difficulty of Paying Living Expenses: Not hard at all  Food Insecurity: No Food Insecurity  . Worried About Charity fundraiser in the Last Year: Never true  . Ran Out of Food in the Last Year: Never true  Transportation Needs: No Transportation Needs  . Lack of Transportation (Medical): No  . Lack of Transportation (Non-Medical): No  Physical Activity: Sufficiently Active  . Days of Exercise per Week: 5 days  . Minutes of Exercise per Session: 30 min  Stress: No Stress Concern Present  . Feeling of Stress : Not at all  Social Connections: Moderately Integrated  . Frequency of Communication with Friends and Family: More than three times a week  . Frequency of Social Gatherings with Friends and Family: More than three times a week  . Attends Religious Services: More than 4 times per year  . Active Member of Clubs or Organizations: Yes  . Attends Archivist Meetings: More than 4 times per year  . Marital Status: Widowed    Tobacco Counseling Counseling given: Not Answered   Clinical Intake:  Pre-visit preparation completed: Yes  Pain : No/denies pain Pain Score: 0-No pain     BMI - recorded: 31.04 Nutritional Status: BMI > 30  Obese Nutritional Risks: None Diabetes: No  How often do you need to have someone help you when you read instructions, pamphlets, or other written materials from your doctor or pharmacy?: 1 - Never What is the last grade level you completed in school?: HSG  Diabetic? no  Interpreter Needed?: No  Information entered by :: Lisette Abu, LPN   Activities of Daily Living In your present state of health, do you have any difficulty performing the following activities: 02/18/2020  Hearing? N  Vision? N  Difficulty concentrating or making decisions? N  Walking or climbing stairs? N  Dressing or bathing? N  Doing errands, shopping? N  Preparing Food and eating ? N   Using the Toilet? N  In the past six months, have you accidently leaked urine? N  Do you have problems with loss of bowel control? N  Managing your Medications? N  Managing your Finances? N  Housekeeping or managing your Housekeeping? N  Some recent data might be hidden    Patient Care Team: [name], MD as PCP - General (Internal Medicine)  Indicate  any recent Medical Services you may have received from other than [facility] providers in the past year (date may be approximate).     Assessment:   This is a routine wellness examination for Diesha.  Hearing/Vision screen No exam data present  Dietary issues and exercise activities discussed: Current Exercise Habits: Home exercise routine, Type of exercise: walking, Time (Minutes): 30, Frequency (Times/Week): 5, Weekly Exercise (Minutes/Week): 150, Intensity: Moderate, Exercise limited by: respiratory conditions(s);cardiac condition(s)  Goals    . DIET - EAT MORE FRUITS AND VEGETABLES     I would like to stay active and keep my weight down.    . Exercise 150 minutes per week (moderate activity)     Will walk 60  Minutes M-F and will continue until Christmas and re-evaluate      Depression Screen PHQ 2/9 Scores 02/18/2020 01/28/2020 01/20/2019 12/25/2016 12/23/2015 02/09/2014 12/22/2012  PHQ - 2 Score 0 0 0 0 0 0 0    Fall Risk Fall Risk  01/28/2020 01/20/2019 12/25/2016 12/23/2015 02/09/2014  Falls in the past year? 0 0 No No No  Number falls in past yr: 0 0 - - -  Injury with Fall? 0 - - - -  Risk for fall due to : No Fall Risks - - - -  Follow up Falls evaluation completed - - - -    FALL RISK PREVENTION PERTAINING TO THE HOME:  Any stairs in or around the home? No  If so, are there any without handrails? No  Home free of loose throw rugs in walkways, pet beds, electrical cords, etc? Yes  Adequate lighting in your home to reduce risk of falls? Yes   ASSISTIVE DEVICES UTILIZED TO PREVENT FALLS:  Life alert? No  Use of  a cane, walker or w/c? No  Grab bars in the bathroom? Yes  Shower chair or bench in shower? Yes  Elevated toilet seat or a handicapped toilet? Yes   TIMED UP AND GO:  Was the test performed? No .  Length of time to ambulate 10 feet: 0 sec.   Gait steady and fast without use of assistive device  Cognitive Function: Normal cognitive status assessed by direct observation by this Nurse Health Advisor. No abnormalities found.          Immunizations Immunization History  Administered Date(s) Administered  . Influenza Split 11/15/2010  . Influenza Whole 01/08/2001, 10/08/2007  . Influenza, High Dose Seasonal PF 11/01/2014, 10/08/2017, 08/26/2018  . Influenza, Seasonal, Injecte, Preservative Fre 12/03/2011  . Influenza,inj,Quad PF,6+ Mos 10/13/2013  . Influenza-Unspecified 10/14/2012, 11/01/2014, 10/05/2015, 10/08/2016, 08/20/2018, 08/26/2019  . PFIZER(Purple Top)SARS-COV-2 Vaccination 02/24/2019, 03/06/2019, 10/05/2019  . Pneumococcal Conjugate-13 12/14/2014  . Pneumococcal Polysaccharide-23 12/23/2015  . Td 07/29/2008    TDAP status: Due, Education has been provided regarding the importance of this vaccine. Advised may receive this vaccine at local pharmacy or Health Dept. Aware to provide a copy of the vaccination record if obtained from local pharmacy or Health Dept. Verbalized acceptance and understanding.  Flu Vaccine status: Up to date  Pneumococcal vaccine status: Up to date  Covid-19 vaccine status: Completed vaccines  Qualifies for Shingles Vaccine? Yes   Zostavax completed No   Shingrix Completed?: Yes  Screening Tests Health Maintenance  Topic Date Due  . DEXA SCAN  02/10/2020  . TETANUS/TDAP  01/27/2021 (Originally 07/30/2018)  . COLONOSCOPY (Pts 45-62yrs Insurance coverage will need to be confirmed)  06/07/2021  . MAMMOGRAM  01/03/2022  . INFLUENZA VACCINE  Completed  . COVID-19  Vaccine  Completed  . Hepatitis C Screening  Completed  . PNA vac Low Risk  Adult  Completed    Health Maintenance  Health Maintenance Due  Topic Date Due  . DEXA SCAN  02/10/2020    Colorectal cancer screening: Type of screening: Colonoscopy. Completed 06/07/2016. Repeat every 5 years  Mammogram status: Completed 01/04/2020. Repeat every year  Bone Density status: Completed 02/10/2015. Results reflect: Bone density results: NORMAL. Repeat every 5 years.  Lung Cancer Screening: (Low Dose CT Chest recommended if Age 51-80 years, 30 pack-year currently smoking OR have quit w/in 15years.) does not qualify.   Lung Cancer Screening Referral: no  Additional Screening:  Hepatitis C Screening: does not qualify; Completed no  Vision Screening: Recommended annual ophthalmology exams for early detection of glaucoma and other disorders of the eye. Is the patient up to date with their annual eye exam?  Yes  Who is the provider or what is the name of the office in which the patient attends annual eye exams? Jola Schmidt, MD. If pt is not established with a provider, would they like to be referred to a provider to establish care? No .   Dental Screening: Recommended annual dental exams for proper oral hygiene  Community Resource Referral / Chronic Care Management: CRR required this visit?  No   CCM required this visit?  No      Plan:     I have personally reviewed and noted the following in the patient's chart:   . Medical and social history . Use of alcohol, tobacco or illicit drugs  . Current medications and supplements . Functional ability and status . Nutritional status . Physical activity . Advanced directives . List of other physicians . Hospitalizations, surgeries, and ER visits in previous 12 months . Vitals . Screenings to include cognitive, depression, and falls . Referrals and appointments  In addition, I have reviewed and discussed with patient certain preventive protocols, quality metrics, and best practice recommendations. A written  personalized care plan for preventive services as well as general preventive health recommendations were provided to patient.     Sheral Flow, LPN   2/83/1517   Nurse Notes: n/a

## 2020-02-18 NOTE — Patient Instructions (Addendum)
Sydney Robinson , Thank you for taking time to come for your Medicare Wellness Visit. I appreciate your ongoing commitment to your health goals. Please review the following plan we discussed and let me know if I can assist you in the future.   Screening recommendations/referrals: Colonoscopy: 06/07/2016; due every 5 years Mammogram: 01/04/2020 Bone Density: 02/10/2015; due every 5 years Recommended yearly ophthalmology/optometry visit for glaucoma screening and checkup Recommended yearly dental visit for hygiene and checkup  Vaccinations: Influenza vaccine: 08/26/2019 Pneumococcal vaccine: 12/14/2014, 12/23/2015 Tdap vaccine: 07/29/2008; overdue Shingles vaccine: 01/04/2020, 02/04/2020  Covid-19: 02/24/2019, 03/06/2019, 10/05/2019  Advanced directives: Please bring a copy of your health care power of attorney and living will to the office at your convenience.  Conditions/risks identified: Yes; Reviewed health maintenance screenings with patient today and relevant education, vaccines, and/or referrals were provided. Please continue to do your personal lifestyle choices by: daily care of teeth and gums, regular physical activity (goal should be 5 days a week for 30 minutes), eat a healthy diet, avoid tobacco and drug use, limiting any alcohol intake, taking a low-dose aspirin (if not allergic or have been advised by your provider otherwise) and taking vitamins and minerals as recommended by your provider. Continue doing brain stimulating activities (puzzles, reading, adult coloring books, staying active) to keep memory sharp. Continue to eat heart healthy diet (full of fruits, vegetables, whole grains, lean protein, water--limit salt, fat, and sugar intake) and increase physical activity as tolerated.  Next appointment: Please schedule your next Medicare Wellness Visit with your Nurse Health Advisor in 1 year by calling [phone].  Preventive Care 6 Years and Older, Female Preventive care refers to  lifestyle choices and visits with your health care provider that can promote health and wellness. What does preventive care include?  A yearly physical exam. This is also called an annual well check.  Dental exams once or twice a year.  Routine eye exams. Ask your health care provider how often you should have your eyes checked.  Personal lifestyle choices, including:  Daily care of your teeth and gums.  Regular physical activity.  Eating a healthy diet.  Avoiding tobacco and drug use.  Limiting alcohol use.  Practicing safe sex.  Taking low-dose aspirin every day.  Taking vitamin and mineral supplements as recommended by your health care provider. What happens during an annual well check? The services and screenings done by your health care provider during your annual well check will depend on your age, overall health, lifestyle risk factors, and family history of disease. Counseling  Your health care provider may ask you questions about your:  Alcohol use.  Tobacco use.  Drug use.  Emotional well-being.  Home and relationship well-being.  Sexual activity.  Eating habits.  History of falls.  Memory and ability to understand (cognition).  Work and work Statistician.  Reproductive health. Screening  You may have the following tests or measurements:  Height, weight, and BMI.  Blood pressure.  Lipid and cholesterol levels. These may be checked every 5 years, or more frequently if you are over 62 years old.  Skin check.  Lung cancer screening. You may have this screening every year starting at age 65 if you have a 30-pack-year history of smoking and currently smoke or have quit within the past 15 years.  Fecal occult blood test (FOBT) of the stool. You may have this test every year starting at age 66.  Flexible sigmoidoscopy or colonoscopy. You may have a sigmoidoscopy every 5 years or  a colonoscopy every 10 years starting at age 41.  Hepatitis C blood  test.  Hepatitis B blood test.  Sexually transmitted disease (STD) testing.  Diabetes screening. This is done by checking your blood sugar (glucose) after you have not eaten for a while (fasting). You may have this done every 1-3 years.  Bone density scan. This is done to screen for osteoporosis. You may have this done starting at age 4.  Mammogram. This may be done every 1-2 years. Talk to your health care provider about how often you should have regular mammograms. Talk with your health care provider about your test results, treatment options, and if necessary, the need for more tests. Vaccines  Your health care provider may recommend certain vaccines, such as:  Influenza vaccine. This is recommended every year.  Tetanus, diphtheria, and acellular pertussis (Tdap, Td) vaccine. You may need a Td booster every 10 years.  Zoster vaccine. You may need this after age 83.  Pneumococcal 13-valent conjugate (PCV13) vaccine. One dose is recommended after age 50.  Pneumococcal polysaccharide (PPSV23) vaccine. One dose is recommended after age 65. Talk to your health care provider about which screenings and vaccines you need and how often you need them. This information is not intended to replace advice given to you by your health care provider. Make sure you discuss any questions you have with your health care provider. Document Released: 01/21/2015 Document Revised: 09/14/2015 Document Reviewed: 10/26/2014 Elsevier Interactive Patient Education  2017 [employer] Prevention in the Home Falls can cause injuries. They can happen to people of all ages. There are many things you can do to make your home safe and to help prevent falls. What can I do on the outside of my home?  Regularly fix the edges of walkways and driveways and fix any cracks.  Remove anything that might make you trip as you walk through a door, such as a raised step or threshold.  Trim any bushes or trees on the  path to your home.  Use bright outdoor lighting.  Clear any walking paths of anything that might make someone trip, such as rocks or tools.  Regularly check to see if handrails are loose or broken. Make sure that both sides of any steps have handrails.  Any raised decks and porches should have guardrails on the edges.  Have any leaves, snow, or ice cleared regularly.  Use sand or salt on walking paths during winter.  Clean up any spills in your garage right away. This includes oil or grease spills. What can I do in the bathroom?  Use night lights.  Install grab bars by the toilet and in the tub and shower. Do not use towel bars as grab bars.  Use non-skid mats or decals in the tub or shower.  If you need to sit down in the shower, use a plastic, non-slip stool.  Keep the floor dry. Clean up any water that spills on the floor as soon as it happens.  Remove soap buildup in the tub or shower regularly.  Attach bath mats securely with double-sided non-slip rug tape.  Do not have throw rugs and other things on the floor that can make you trip. What can I do in the bedroom?  Use night lights.  Make sure that you have a light by your bed that is easy to reach.  Do not use any sheets or blankets that are too big for your bed. They should not hang down onto  the floor.  Have a firm chair that has side arms. You can use this for support while you get dressed.  Do not have throw rugs and other things on the floor that can make you trip. What can I do in the kitchen?  Clean up any spills right away.  Avoid walking on wet floors.  Keep items that you use a lot in easy-to-reach places.  If you need to reach something above you, use a strong step stool that has a grab bar.  Keep electrical cords out of the way.  Do not use floor polish or wax that makes floors slippery. If you must use wax, use non-skid floor wax.  Do not have throw rugs and other things on the floor that can  make you trip. What can I do with my stairs?  Do not leave any items on the stairs.  Make sure that there are handrails on both sides of the stairs and use them. Fix handrails that are broken or loose. Make sure that handrails are as long as the stairways.  Check any carpeting to make sure that it is firmly attached to the stairs. Fix any carpet that is loose or worn.  Avoid having throw rugs at the top or bottom of the stairs. If you do have throw rugs, attach them to the floor with carpet tape.  Make sure that you have a light switch at the top of the stairs and the bottom of the stairs. If you do not have them, ask someone to add them for you. What else can I do to help prevent falls?  Wear shoes that:  Do not have high heels.  Have rubber bottoms.  Are comfortable and fit you well.  Are closed at the toe. Do not wear sandals.  If you use a stepladder:  Make sure that it is fully opened. Do not climb a closed stepladder.  Make sure that both sides of the stepladder are locked into place.  Ask someone to hold it for you, if possible.  Clearly mark and make sure that you can see:  Any grab bars or handrails.  First and last steps.  Where the edge of each step is.  Use tools that help you move around (mobility aids) if they are needed. These include:  Canes.  Walkers.  Scooters.  Crutches.  Turn on the lights when you go into a dark area. Replace any light bulbs as soon as they burn out.  Set up your furniture so you have a clear path. Avoid moving your furniture around.  If any of your floors are uneven, fix them.  If there are any pets around you, be aware of where they are.  Review your medicines with your doctor. Some medicines can make you feel dizzy. This can increase your chance of falling. Ask your doctor what other things that you can do to help prevent falls. This information is not intended to replace advice given to you by your health care  provider. Make sure you discuss any questions you have with your health care provider. Document Released: 10/21/2008 Document Revised: 06/02/2015 Document Reviewed: 01/29/2014 Elsevier Interactive Patient Education  2017 [employer].

## 2020-02-24 DIAGNOSIS — G4733 Obstructive sleep apnea (adult) (pediatric): Secondary | ICD-10-CM | POA: Diagnosis not present

## 2020-03-02 DIAGNOSIS — N958 Other specified menopausal and perimenopausal disorders: Secondary | ICD-10-CM | POA: Diagnosis not present

## 2020-03-02 DIAGNOSIS — Z01419 Encounter for gynecological examination (general) (routine) without abnormal findings: Secondary | ICD-10-CM | POA: Diagnosis not present

## 2020-03-02 DIAGNOSIS — Z6831 Body mass index (BMI) 31.0-31.9, adult: Secondary | ICD-10-CM | POA: Diagnosis not present

## 2020-03-02 LAB — HM DEXA SCAN: HM Dexa Scan: NORMAL

## 2020-03-17 ENCOUNTER — Encounter: Payer: Self-pay | Admitting: Internal Medicine

## 2020-03-17 DIAGNOSIS — G4733 Obstructive sleep apnea (adult) (pediatric): Secondary | ICD-10-CM | POA: Diagnosis not present

## 2020-03-17 NOTE — Progress Notes (Signed)
Outside notes received. Information abstracted. Notes sent to scan.  

## 2020-04-17 DIAGNOSIS — G4733 Obstructive sleep apnea (adult) (pediatric): Secondary | ICD-10-CM | POA: Diagnosis not present

## 2020-04-27 ENCOUNTER — Encounter: Payer: Self-pay | Admitting: Internal Medicine

## 2020-04-28 ENCOUNTER — Telehealth: Payer: Self-pay | Admitting: Cardiovascular Disease

## 2020-04-28 ENCOUNTER — Encounter: Payer: Self-pay | Admitting: Internal Medicine

## 2020-04-28 ENCOUNTER — Ambulatory Visit (INDEPENDENT_AMBULATORY_CARE_PROVIDER_SITE_OTHER): Payer: Medicare PPO | Admitting: Internal Medicine

## 2020-04-28 ENCOUNTER — Other Ambulatory Visit: Payer: Self-pay

## 2020-04-28 VITALS — BP 114/70 | HR 63 | Temp 97.0°F | Ht 67.0 in | Wt 199.0 lb

## 2020-04-28 DIAGNOSIS — R0602 Shortness of breath: Secondary | ICD-10-CM | POA: Diagnosis not present

## 2020-04-28 DIAGNOSIS — G4733 Obstructive sleep apnea (adult) (pediatric): Secondary | ICD-10-CM

## 2020-04-28 DIAGNOSIS — Z9989 Dependence on other enabling machines and devices: Secondary | ICD-10-CM

## 2020-04-28 NOTE — Telephone Encounter (Signed)
She was an as needed follow up so I don't see that anything needs to be scheduled.

## 2020-04-28 NOTE — Patient Instructions (Signed)
The patient should have follow up scheduled with myself in 6 months.   I think you should go back to your old mask for sleep apnea. The new one isn't as effective and you are having too much leakage.   Follow up with cardiology or PCP about changing your blood pressure medicine.

## 2020-04-28 NOTE — Telephone Encounter (Signed)
Patient stated she would like to stay at [facility] and would like to start seeing Dr. Jenkins Rouge

## 2020-04-28 NOTE — Progress Notes (Signed)
Sydney Robinson    [id number]    [birth year]  Primary Care Physician:Burns, Claudina Lick, MD Date of Appointment: 04/28/2020 Established Patient Visit  Chief complaint:   Chief Complaint  Patient presents with  . Follow-up    6 mo f/u for OSA and SOB. States she is not seeing the benefits from cpap. She wants to discuss labetalol possible causing SOB.      HPI: Sydney Robinson is a 75 y.o. with shortness of breath and OSA who presents for follow up.    Interval Updates: Here for follow up for OSA. She feels she isn't sleeping as well as she was before. Recently switched masks to nasal pillows and feels there is a lot of leakage.   She is wondering if labetalol could be causing her fatigue and dyspnea. She has been on this for a couple of decades.    I have reviewed the patient's family social and past medical history and updated as appropriate.   Past Medical History:  Diagnosis Date  . 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 Diverticulosis   . Hyperlipidemia   . Hypertension   . Murmur     Past Surgical History:  Procedure Laterality Date  . BREAST EXCISIONAL BIOPSY Bilateral 1970s  . CATARACT EXTRACTION Right   . COLONOSCOPY W/ POLYPECTOMY  1998    negative 2003 & 2008; 2013  . DILATION AND CURETTAGE OF UTERUS     x 3  . fibroidectomy     x3  . g2 p2    . hemorrhoidal banding  2011   Dr Earlean Shawl  . Neuroma Resected Foot     Right   . OVARIAN CYST REMOVAL    . ROTATOR CUFF REPAIR    . TONSILLECTOMY      Family History  Problem Relation Age of Onset  . Diabetes Mother   . Alzheimer's disease Mother   . Heart failure Mother   . Glaucoma Mother   . Arthritis Mother   . Heart attack Maternal Aunt 59  . Stroke Paternal Aunt 40       cns aneurysm  . Uterine cancer Paternal Aunt   . Breast cancer Sister        2 sisters   . Heart disease Sister   . Diverticulitis Sister   . Breast cancer Sister 4  . Glaucoma Sister   . Diabetes Maternal Grandmother    . Diabetes Other        MGGM  . Colon cancer Other        cousin  . Diabetes Maternal Aunt   . Diabetes Maternal Uncle   . Heart attack Maternal Uncle   . Diabetes Paternal Aunt   . Stroke Maternal Grandfather     Social History   Occupational History  . Not on file  Tobacco Use  . Smoking status: Never Smoker  . Smokeless tobacco: Never Used  Substance and Sexual Activity  . Alcohol use: No    Alcohol/week: 0.0 standard drinks    Comment: Wine rarely  . Drug use: No  . Sexual activity: Not on file     Physical Exam: Blood pressure 114/70, pulse 63, temperature (!) 97 F (36.1 C), temperature source Temporal, height 5\' 7"  (1.702 m), weight 199 lb (90.3 kg), SpO2 100 %.  Gen:      No acute distress Lungs:    ctab no wheezes or crackles CV:  RRR no mrg   Data Reviewed: Imaging: I have personally reviewed the chest xray April 2021. No acute process.   PFTs:  PFT Results Latest Ref Rng & Units 08/21/2019  FVC-Pre L 2.46  FVC-Predicted Pre % 92  FVC-Post L 2.53  FVC-Predicted Post % 95  Pre FEV1/FVC % % 81  Post FEV1/FCV % % 85  FEV1-Pre L 1.99  FEV1-Predicted Pre % 96  FEV1-Post L 2.14  DLCO uncorrected ml/min/mmHg 18.24  DLCO UNC% % 85  DLCO corrected ml/min/mmHg 18.24  DLCO COR %Predicted % 85  DLVA Predicted % 111  TLC L 4.22  TLC % Predicted % 76  RV % Predicted % 77   I have personally reviewed the patient's PFTs and there is no airflow limitation and mild restriction to ventilation likely secondary to body habitus.  Echocardiogram 05/20/2019 1. Left ventricular ejection fraction, by estimation, is 60 to 65%. The  left ventricle has normal function. The left ventricle has no regional  wall motion abnormalities. Left ventricular diastolic parameters are  consistent with Grade I diastolic  dysfunction (impaired relaxation).  2. Right ventricular systolic function is normal. The right ventricular  size is normal. Tricuspid regurgitation  signal is inadequate for assessing  PA pressure.  3. The mitral valve is abnormal. Trivial mitral valve regurgitation.  4. The aortic valve is tricuspid. Aortic valve regurgitation is not  visualized. Mild aortic valve sclerosis is present, with no evidence of  aortic valve stenosis.  5. Aortic dilatation noted. 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  6. The inferior vena cava is normal in size with greater than 50%  respiratory variability, suggesting right atrial pressure of 3 mmHg.  Labs: Lab Results  Component Value Date   WBC 4.9 01/28/2020   HGB 13.8 01/28/2020   HCT 41.2 01/28/2020   MCV 86.5 01/28/2020   PLT 206.0 01/28/2020   Lab Results  Component Value Date   NA 141 01/28/2020   K 4.1 01/28/2020   CL 107 01/28/2020   CO2 29 01/28/2020   Cr 1.05 Immunization status: Immunization History  Administered Date(s) Administered  . Influenza Split 11/15/2010  . Influenza Whole 01/08/2001, 10/08/2007  . Influenza, High Dose Seasonal PF 11/01/2014, 10/08/2017, 08/26/2018  . Influenza, Seasonal, Injecte, Preservative Fre 12/03/2011  . Influenza,inj,Quad PF,6+ Mos 10/13/2013  . Influenza-Unspecified 10/14/2012, 11/01/2014, 10/05/2015, 10/08/2016, 08/20/2018, 08/26/2019  . PFIZER(Purple Top)SARS-COV-2 Vaccination 02/24/2019, 03/06/2019, 10/05/2019  . Pneumococcal Conjugate-13 12/14/2014  . Pneumococcal Polysaccharide-23 12/23/2015  . Td 07/29/2008    Assessment:  Shortness of Breath with mild restriction to ventilation OSA on CPAP Allergic Rhinitis   Plan/Recommendations: I reviewed her sleep download today. She is having more leakage and less effective treatment of AHI with her new mask. Continue Auto CPAP. I have advised her to go back to her previous mask when she had improvement of her symptoms. She had substantial benefit previously with the correct mask.  Follow up with cardiology or PCP before switching BP medications. The only way  to know if it is causing your symptoms is a trial off the medication. She might need another agent - I do not recommend stopping cold [place].  She was concerned about Kidney function today - Cr was reviewed and is in normal limits.   Return to Care: Return in about 6 months (around 10/28/2020).   Lenice Llamas, MD [facility] [place]

## 2020-05-03 ENCOUNTER — Telehealth: Payer: Self-pay | Admitting: Cardiovascular Disease

## 2020-05-03 NOTE — Telephone Encounter (Signed)
I am OK with this

## 2020-05-03 NOTE — Telephone Encounter (Signed)
Patient wants a transition of care from Dr. Oval Linsey and Nurse, to Dr. Percival Spanish due to move; please confirm transfer

## 2020-05-17 DIAGNOSIS — G4733 Obstructive sleep apnea (adult) (pediatric): Secondary | ICD-10-CM | POA: Diagnosis not present

## 2020-05-27 DIAGNOSIS — G4733 Obstructive sleep apnea (adult) (pediatric): Secondary | ICD-10-CM | POA: Diagnosis not present

## 2020-06-01 ENCOUNTER — Encounter: Payer: Self-pay | Admitting: Cardiology

## 2020-06-01 NOTE — Progress Notes (Signed)
Cardiology Office Note   Date:  06/02/2020   ID:  Sydney, Robinson 03-Feb-1945, MRN [id number]  PCP:  Sydney Rail, MD  Cardiologist:   Sydney Breeding, MD   Chief Complaint  Patient presents with  . Loss of Consciousness      History of Present Illness: Sydney Robinson is a 75 y.o. female who presents for evaluation of SOB.  She was seen by Dr. Oval Linsey.   She had a negative Lexiscan Myoview.  Echo showed a normal EF and mild diastolic dysfunction.   She continues to have shortness of breath.  She had some mild sleep apnea diagnosed.  She is resting with CPAP.  She has had pulmonary function testing without significant findings.  She is not sure that she is particularly short of breath and does not really have any objective findings.  She wonders if it could be her imagination.  She does not think it is getting any worse.  She is able to do activities of daily living and sometimes notices that she might be breathless but other times not.  She is not describing any new PND or orthopnea.  She is had no new palpitations, presyncope or syncope.  She did have some questions about the finding of diastolic dysfunction on her echo.  Of note she had a syncopal episode coincidentally today.  She was at the hairdresser.  She was feeling okay.  She stood up and she felt a little stiff and dizzy.  She took a few steps and then she went down and was very briefly.  There was no trauma.  EMS was called and after several minutes later and actually her blood pressure was elevated.  They did an EKG which I reviewed.  She had sinus bradycardia with a heart rate of 58.  There were no other significant abnormalities.  She felt well at that time and declined transfer to the emergency room.  She has not had any presyncope or syncope otherwise.  She does not describe orthostatic symptoms.  She has had no palpitations.  She denies any chest pressure, neck or arm discomfort.  Past Medical History:  Diagnosis  Date  . 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 Diverticulosis   . Hyperlipidemia   . Hypertension     Past Surgical History:  Procedure Laterality Date  . BREAST EXCISIONAL BIOPSY Bilateral 1970s  . CATARACT EXTRACTION Right   . COLONOSCOPY W/ POLYPECTOMY  1998    negative 2003 & 2008; 2013  . DILATION AND CURETTAGE OF UTERUS     x 3  . fibroidectomy     x3  . g2 p2    . hemorrhoidal banding  2011   Dr Earlean Shawl  . Neuroma Resected Foot     Right   . OVARIAN CYST REMOVAL    . ROTATOR CUFF REPAIR    . TONSILLECTOMY       Current Outpatient Medications  Medication Sig Dispense Refill  . albuterol (VENTOLIN HFA) 108 (90 Base) MCG/ACT inhaler Inhale 2 puffs into the lungs every 6 (six) hours as needed for wheezing or shortness of breath. 18 g 5  . Biotin 1 MG CAPS Take by mouth.    . Cholecalciferol (VITAMIN D3 PO) Take 1 tablet by mouth as needed.     . clonazePAM (KLONOPIN) 0.5 MG tablet TAKE 1/2-1 TABLET AT BEDTIME AS NEEDED 30 tablet 2  . fexofenadine (ALLEGRA) 60 MG tablet Take 60 mg by mouth 2 (  two) times daily.    Marland Kitchen labetalol (NORMODYNE) 300 MG tablet TAKE HALF A TABLETS (150 MG TOTAL) BY MOUTH 2 (TWO) TIMES DAILY. 90 tablet 7  . omega-3 acid ethyl esters (LOVAZA) 1 g capsule Take 1 g by mouth daily.    . polycarbophil (FIBERCON) 625 MG tablet Take 625 mg by mouth daily.    . simvastatin (ZOCOR) 40 MG tablet TAKE 1 TABLET BY MOUTH EVERYDAY AT BEDTIME 90 tablet 3   No current facility-administered medications for this visit.    Allergies:   Shellfish allergy    ROS:  Please see the history of present illness.   Otherwise, review of systems are positive for none.   All other systems are reviewed and negative.    PHYSICAL EXAM: VS:  BP (!) 152/80   Pulse (!) 58   Ht 5\' 7"  (1.702 m)   Wt 200 lb 12.8 oz (91.1 kg)   SpO2 98%   BMI 31.45 kg/m  , BMI Body mass index is 31.45 kg/m. GENERAL:  Well appearing HEENT:  Pupils equal round and reactive, fundi not visualized,  oral mucosa unremarkable NECK:  No jugular venous distention, waveform within normal limits, carotid upstroke brisk and symmetric, no bruits, no thyromegaly LYMPHATICS:  No cervical, inguinal adenopathy LUNGS:  Clear to auscultation bilaterally BACK:  No CVA tenderness CHEST:  Unremarkable HEART:  PMI not displaced or sustained,S1 and S2 within normal limits, no S3, no S4, no clicks, no rubs, no murmurs ABD:  Flat, positive bowel sounds normal in frequency in pitch, no bruits, no rebound, no guarding, no midline pulsatile mass, no hepatomegaly, no splenomegaly EXT:  2 plus pulses throughout, no edema, no cyanosis no clubbing SKIN:  No rashes no nodules NEURO:  Cranial nerves II through XII grossly intact, motor grossly intact throughout PSYCH:  Cognitively intact, oriented to person place and time    EKG:  EKG is ordered today. The ekg ordered today demonstrates sinus rhythm, rate 58, axis within normal limits, RSR prime V1 and V2, no acute ST-T wave changes.   Recent Labs: 01/28/2020: ALT 8; BUN 12; Creatinine, Ser 1.05; Hemoglobin 13.8; Platelets 206.0; Potassium 4.1; Sodium 141; TSH 2.17    Lipid Panel    Component Value Date/Time   CHOL 214 (H) 01/28/2020 0952   CHOL 264 (H) 12/07/2013 1539   TRIG 106.0 01/28/2020 0952   TRIG 176 (H) 12/07/2013 1539   HDL 79.20 01/28/2020 0952   HDL 74 12/07/2013 1539   CHOLHDL 3 01/28/2020 0952   VLDL 21.2 01/28/2020 0952   LDLCALC 114 (H) 01/28/2020 0952   LDLCALC 155 (H) 12/07/2013 1539   LDLDIRECT 130.5 12/29/2012 0850      Wt Readings from Last 3 Encounters:  06/02/20 200 lb 12.8 oz (91.1 kg)  04/28/20 199 lb (90.3 kg)  02/18/20 198 lb 3.2 oz (89.9 kg)      Other studies Reviewed: Additional studies/ records that were reviewed today include: EMS EKG. Review of the above records demonstrates:  Please see elsewhere in the note.     ASSESSMENT AND PLAN:  SOB: Her shortness of breath has been thoroughly evaluated.  If this  persists in the future I might do a BNP level but I do not strongly suspect her mild diastolic dysfunction is contributing.  We talked about this.  We talked about salt and fluid restriction.  She is satisfied with this discussion.  No further work-up.  HTN: Her blood pressure is elevated today but she is insistent  that it is systolic 014 range routinely.  She and that her labetalol works well.  No change in therapy.  DYSLIPIDEMIA: She has an excellent lipid profile no change in therapy.  SYNCOPE: The episode today was possibly orthostatic for maybe vagal.  She has not had symptoms like this before or since.  If she has recurrent symptoms I might apply a monitor though I am not strongly suspecting a dysrhythmia.  We had a long discussion about this and she will let me know if she has future symptoms.  No change in therapy.  Current medicines are reviewed at length with the patient today.  The patient does not have concerns regarding medicines.  The following changes have been made:  no change  Labs/ tests ordered today include:   Orders Placed This Encounter  Procedures  . EKG 12-Lead     Disposition:   FU with me in 1 year or sooner if needed   Signed, Sydney Breeding, MD  06/02/2020 5:34 PM    [facility]

## 2020-06-02 ENCOUNTER — Ambulatory Visit (INDEPENDENT_AMBULATORY_CARE_PROVIDER_SITE_OTHER): Payer: Medicare PPO | Admitting: Cardiology

## 2020-06-02 ENCOUNTER — Encounter: Payer: Self-pay | Admitting: Cardiology

## 2020-06-02 ENCOUNTER — Other Ambulatory Visit: Payer: Self-pay

## 2020-06-02 ENCOUNTER — Telehealth: Payer: Self-pay | Admitting: Cardiology

## 2020-06-02 VITALS — BP 152/80 | HR 58 | Ht 67.0 in | Wt 200.8 lb

## 2020-06-02 DIAGNOSIS — R0602 Shortness of breath: Secondary | ICD-10-CM

## 2020-06-02 DIAGNOSIS — E785 Hyperlipidemia, unspecified: Secondary | ICD-10-CM | POA: Diagnosis not present

## 2020-06-02 DIAGNOSIS — I10 Essential (primary) hypertension: Secondary | ICD-10-CM

## 2020-06-02 NOTE — Patient Instructions (Signed)
Medication Instructions:  No Changes In Medications at this time.  *If you need a refill on your cardiac medications before your next appointment, please call your pharmacy*  Follow-Up: At [facility], you and your health needs are our priority.  As part of our continuing mission to provide you with exceptional heart care, we have created designated Provider Care Teams.  These Care Teams include your primary Cardiologist (physician) and Advanced Practice Providers (APPs -  Physician Assistants and Nurse Practitioners) who all work together to provide you with the care you need, when you need it.   Your next appointment:   1 year(s)  The format for your next appointment:   In Person  Provider:   Minus Breeding, MD

## 2020-06-02 NOTE — Telephone Encounter (Signed)
Received a call from patient.She stated about 1 hour ago she blacked out.Stated she was at hair salon after sitting under a hair dryer she stood up and walked a couple of steps, she felt light headed she blacked out.Stated EMS came B/P 180/102.She refused to go to ED.She has appointment with Dr.Hochrein this afternoon at 4:00 pm.She wanted to know if she can be seen sooner.Advised his schedule is full.She will arrive 15 mins early 3:45 pm.Stated she feels ok except she has a headache.Advised if she feels faint again go to ED.

## 2020-06-02 NOTE — Telephone Encounter (Signed)
Pt c/o Syncope: STAT if syncope occurred within 30 minutes and pt complains of lightheadedness High Priority if episode of passing out, completely, today or in last 24 hours   1. Did you pass out today? yes  2. When is the last time you passed out?  Called 911 @ 1:45pm  3. Has this occurred multiple times? yes  4. Did you have any symptoms prior to passing out? None

## 2020-06-02 NOTE — Telephone Encounter (Signed)
Patient seen today in clinic by Dr. Percival Spanish.

## 2020-06-17 DIAGNOSIS — G4733 Obstructive sleep apnea (adult) (pediatric): Secondary | ICD-10-CM | POA: Diagnosis not present

## 2020-06-23 ENCOUNTER — Ambulatory Visit (INDEPENDENT_AMBULATORY_CARE_PROVIDER_SITE_OTHER): Payer: Medicare PPO | Admitting: Orthopaedic Surgery

## 2020-06-23 ENCOUNTER — Ambulatory Visit (INDEPENDENT_AMBULATORY_CARE_PROVIDER_SITE_OTHER): Payer: Medicare PPO

## 2020-06-23 ENCOUNTER — Other Ambulatory Visit: Payer: Self-pay

## 2020-06-23 ENCOUNTER — Encounter: Payer: Self-pay | Admitting: Orthopaedic Surgery

## 2020-06-23 VITALS — Ht 67.0 in | Wt 200.0 lb

## 2020-06-23 DIAGNOSIS — M549 Dorsalgia, unspecified: Secondary | ICD-10-CM | POA: Diagnosis not present

## 2020-06-23 DIAGNOSIS — G8929 Other chronic pain: Secondary | ICD-10-CM | POA: Diagnosis not present

## 2020-06-23 DIAGNOSIS — M79604 Pain in right leg: Secondary | ICD-10-CM | POA: Diagnosis not present

## 2020-06-23 DIAGNOSIS — M545 Low back pain, unspecified: Secondary | ICD-10-CM

## 2020-06-23 MED ORDER — METHOCARBAMOL 500 MG PO TABS: 500.0000 mg | ORAL_TABLET | Freq: Every evening | ORAL | 0 refills | Status: DC | PRN

## 2020-06-23 MED ORDER — PREDNISONE 5 MG (21) PO TBPK
ORAL_TABLET | ORAL | 0 refills | Status: DC
Start: 2020-06-23 — End: 2020-10-25

## 2020-06-23 NOTE — Progress Notes (Signed)
Office Visit Note   Patient: Sydney Robinson           Date of Birth: 14-Jan-1945           MRN: [id number] Visit Date: 06/23/2020              Requested by: [name], MD [place],  [place] [address] PCP: [name], MD   Assessment & Plan: Visit Diagnoses:  1. Chronic right-sided low back pain without sciatica   2. Pain of back and right lower extremity     Plan: Impression is chronic right-sided low back pain with acute flareup.  Although the patient has significant degenerative changes to the right hip on imaging in addition to a positive logroll on clinical exam, I do not believe her current symptoms are actually coming from her hip.  We have discussed starting her on a steroid taper and trying a course of outpatient physical therapy which she is agreeable to.  She will follow-up with [place] as needed.  Follow-Up Instructions: Return if symptoms worsen or fail to improve.   Orders:  Orders Placed This Encounter  Procedures   XR Lumbar Spine 2-3 Views   XR HIP UNILAT W OR W/O PELVIS 2-3 VIEWS RIGHT   Meds ordered this encounter  Medications   predniSONE (STERAPRED UNI-PAK 21 TAB) 5 MG (21) TBPK tablet    Sig: Take as directed.    Dispense:  21 tablet    Refill:  0   methocarbamol (ROBAXIN) 500 MG tablet    Sig: Take 1 tablet (500 mg total) by mouth at bedtime as needed.    Dispense:  10 tablet    Refill:  0       Procedures: No procedures performed   Clinical Data: No additional findings.   Subjective: Chief Complaint  Patient presents with   Lower Back - Pain    HPI patient is a pleasant 75 year old female who comes in today with right lower back pain.  This is been ongoing for the past 2 weeks.  She notes that she was getting her haircut approximately 3 weeks ago when she passed out causing her to fall to the ground.  The pain in the back started a week after the fall.  She does not believe this is related.  The pain she is having is  to the right lower back.  No pain into the groin or anterior thigh.  Pain is worse with walking as well as leaning to the right.  She has been resting which does relieve her symptoms.  She has been taking Motrin which helps as well.  She denies any paresthesias to the right lower extremity.  She does note previous history of back problems many years ago with intermittent flareups.  Review of Systems as detailed in HPI.  All others reviewed and are negative.   Objective: Vital Signs: Ht 5\' 7"  (1.702 m)   Wt 200 lb (90.7 kg)   BMI 31.32 kg/m   Physical Exam well-developed and well-nourished female in no acute distress.  Alert oriented x3.  Ortho Exam lumbar spine exam shows no spinous or paraspinous tenderness.  No pain with range of motion.  Negative straight leg raise.  She does have mild pain with logroll and FADIR.  No focal weakness.  She is neurovascular intact distally.  Specialty Comments:  No specialty comments available.  Imaging: XR HIP UNILAT W OR W/O PELVIS 2-3 VIEWS RIGHT  Result Date:  06/23/2020 X-rays demonstrate moderate joint space narrowing both hips  XR Lumbar Spine 2-3 Views  Result Date: 06/23/2020 X-rays demonstrate moderate degenerative disc disease L4-5    PMFS History: Patient Active Problem List   Diagnosis Date Noted   Right carotid bruit 01/28/2020   OSA (obstructive sleep apnea) 07/21/2019   Coordination of complex care 07/21/2019   Grade I diastolic dysfunction 16/10/9602   Ascending aorta dilatation ([place]), mild 05/21/2019   Dyspnea on exertion 04/23/2019   Primary osteoarthritis of right hip 01/28/2018   Right knee pain 12/16/2017   Insomnia 12/24/2016   Obesity 07/04/2016   Internal hemorrhoids 07/04/2016   Lower back pain 12/23/2015   Prediabetes 11/04/2012   RAD (reactive airway disease) 12/03/2011   DIVERTICULOSIS, COLON 08/12/2009   Hyperlipidemia 06/04/2006   Essential hypertension 06/04/2006   History of colonic polyps 06/04/2006    Past Medical History:  Diagnosis Date   Colon polyps 1999   adenomatous   Diverticulosis    Hyperlipidemia    Hypertension     Family History  Problem Relation Age of Onset   Diabetes Mother    Alzheimer's disease Mother    Heart failure Mother    Glaucoma Mother    Arthritis Mother    Heart attack Maternal Aunt 29   Stroke Paternal Aunt 64       cns aneurysm   Uterine cancer Paternal 74    Breast cancer Sister        2 sisters    Heart disease Sister    Diverticulitis Sister    Breast cancer Sister 53   Glaucoma Sister    Diabetes Maternal Grandmother    Diabetes Other        MGGM   Colon cancer Other        cousin   Diabetes Maternal Aunt    Diabetes Maternal Uncle    Heart attack Maternal Uncle    Diabetes Paternal Aunt    Stroke Maternal Grandfather     Past Surgical History:  Procedure Laterality Date   BREAST EXCISIONAL BIOPSY Bilateral 1970s   CATARACT EXTRACTION Right    COLONOSCOPY W/ POLYPECTOMY  1998    negative 2003 & 2008; 2013   DILATION AND CURETTAGE OF UTERUS     x 3   fibroidectomy     x3   g2 p2     hemorrhoidal banding  2011   Dr Earlean Shawl   Neuroma Resected Foot     Right    OVARIAN CYST REMOVAL     ROTATOR CUFF REPAIR     TONSILLECTOMY     Social History   Occupational History   Not on file  Tobacco Use   Smoking status: Never   Smokeless tobacco: Never  Substance and Sexual Activity   Alcohol use: No    Alcohol/week: 0.0 standard drinks    Comment: Wine rarely   Drug use: No   Sexual activity: Not on file

## 2020-06-28 ENCOUNTER — Encounter: Payer: Self-pay | Admitting: Physical Therapy

## 2020-06-28 ENCOUNTER — Other Ambulatory Visit: Payer: Self-pay

## 2020-06-28 ENCOUNTER — Ambulatory Visit (INDEPENDENT_AMBULATORY_CARE_PROVIDER_SITE_OTHER): Payer: Medicare PPO | Admitting: Physical Therapy

## 2020-06-28 DIAGNOSIS — R262 Difficulty in walking, not elsewhere classified: Secondary | ICD-10-CM

## 2020-06-28 DIAGNOSIS — G8929 Other chronic pain: Secondary | ICD-10-CM

## 2020-06-28 DIAGNOSIS — M545 Low back pain, unspecified: Secondary | ICD-10-CM | POA: Diagnosis not present

## 2020-06-28 DIAGNOSIS — M6281 Muscle weakness (generalized): Secondary | ICD-10-CM

## 2020-06-28 NOTE — Patient Instructions (Signed)
Access Code: [email or web address] URL: [email or web address] Date: 06/28/2020 Prepared by: Kearney Hard  Exercises Supine Figure 4 Piriformis Stretch - 2 x daily - 7 x weekly - 5 reps - 10 seconds hold Supine Piriformis Stretch with Foot on Ground - 2 x daily - 7 x weekly - 5 reps - 10 seconds hold Supine Bridge - 2 x daily - 7 x weekly - 2 sets - 10 reps - 5 seconds hold Standing Lumbar Extension at Wall - Forearms - 2 x daily - 7 x weekly - 10 reps - 5 seconds hold Hooklying Hamstring Stretch with Strap - 2 x daily - 7 x weekly - 5 reps - 30 seconds hold
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
## 2020-07-05 DIAGNOSIS — H2512 Age-related nuclear cataract, left eye: Secondary | ICD-10-CM | POA: Diagnosis not present

## 2020-07-05 DIAGNOSIS — Z961 Presence of intraocular lens: Secondary | ICD-10-CM | POA: Diagnosis not present

## 2020-07-12 ENCOUNTER — Encounter: Payer: Self-pay | Admitting: Physical Therapy

## 2020-07-12 ENCOUNTER — Ambulatory Visit (INDEPENDENT_AMBULATORY_CARE_PROVIDER_SITE_OTHER): Payer: Medicare PPO | Admitting: Physical Therapy

## 2020-07-12 ENCOUNTER — Other Ambulatory Visit: Payer: Self-pay

## 2020-07-12 DIAGNOSIS — M545 Low back pain, unspecified: Secondary | ICD-10-CM

## 2020-07-12 DIAGNOSIS — R262 Difficulty in walking, not elsewhere classified: Secondary | ICD-10-CM

## 2020-07-12 DIAGNOSIS — M6281 Muscle weakness (generalized): Secondary | ICD-10-CM

## 2020-07-12 DIAGNOSIS — G8929 Other chronic pain: Secondary | ICD-10-CM

## 2020-07-12 NOTE — Therapy (Signed)
[facility] Physical Therapy [address] [place], [place], [address] Phone: [phone]   Fax:  [phone]  Physical Therapy Treatment  Patient Details  Name: Sydney Robinson MRN: [id number] Date of Birth: [birth year] Referring Provider (PT): Tiney Rouge PA-C   Encounter Date: 07/12/2020   PT End of Session - 07/12/20 1425     Visit Number 2    Number of Visits 12    Date for PT Re-Evaluation 08/12/20    Authorization Type humana    Progress Note Due on Visit 10    PT Start Time 1345    PT Stop Time 1430    PT Time Calculation (min) 45 min    Activity Tolerance Patient tolerated treatment well    Behavior During Therapy [facility] for tasks assessed/performed             Past Medical History:  Diagnosis Date   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   Subjective Assessment - 07/12/20 1355     Subjective Pt arriving to therapy reporting about 3/10 overall pain in her back and Rt leg    Pertinent History prediabetic, OSA, diastolic dysfunction, Primary OA right hip, R knee pain, obesity, LBP, RAD, essential HTN, dyspnea, colonic polyps    How long can you sit comfortably? 1 hour    How long can you walk comfortably? 15 minutes    Diagnostic tests X-ray with moderate degenerative changes to L4-5    Patient Stated Goals get rid of pain completely    Pain Onset 1 to 4 weeks ago                [facility] PT Assessment - 07/12/20 0001        Assessment   Medical Diagnosis M54.50 chronic right sided LBP without sciatica    Referring Provider (PT) Tiney Rouge PA-C      AROM   Overall AROM Comments Rt hip ER limited to 20 degrees, IR was 30 degrees measured in supine at 90 deg hip flexion                           [facility] Adult PT Treatment/Exercise - 07/12/20 0001       Lumbar Exercises: Stretches   Active Hamstring Stretch 2 reps;30 seconds    Single Knee to Chest Stretch Right;Left;2 reps;30 seconds    Lower Trunk Rotation 5 reps;10 seconds    Figure 4 Stretch 2 reps;30 seconds    Figure 4 Stretch Limitations limited ROM in Right hip ER      Lumbar Exercises: Aerobic   Nustep L5X8 min      Lumbar Exercises: Machines for Strengthening   Leg Press 87# DL 3X10  Lumbar Exercises: Standing   Row 20 reps    Row Limitations 5# on each cable machine    Shoulder Extension 20 reps    Shoulder Extension Limitations 5# on each cable machine    Other Standing Lumbar Exercises extension with elbows at wall x 10 holding 5 seconds each      Lumbar Exercises: Supine   Bridge 10 reps;5 seconds      Manual Therapy   Manual therapy comments Rt hip PROM with emphasis on ER/IR, long axis distraction                      PT Short Term Goals - 06/28/20 1551       PT SHORT TERM GOAL #1   Title Pt will be independent in her initial HEP    Time 3    Period Weeks    Status New    Target Date 07/22/20      PT SHORT TERM GOAL #2   Title Pt will improve her 5 time sit to stand to </= 14 seconds    Baseline 19 seconds no UE support    Time 3    Period Weeks    Status New    Target Date 07/22/20               PT Long Term Goals - 07/12/20 1436       PT LONG TERM GOAL #1   Title Pt will be independent in her advanced HEP    Time 6    Period Weeks    Status New      PT LONG TERM GOAL #2   Title Pt will improve her bilateral hip strength to grossly 5/5.    Baseline see flow sheets     Time 6    Period Weeks    Status New      PT LONG TERM GOAL #3   Title Pt will be able to lift 15# from floor to over head with pain </= 2/10.    Baseline pain varies with bending and lifting    Time 6    Period Weeks      PT LONG TERM GOAL #4   Title Pt will be able to amb community distances wtih no pain for > 30 minutes.    Baseline pt reporting having to stop after 15 minutes due to pain in low back    Time 6    Period Weeks    Status New      PT LONG TERM GOAL #5   Title Pt will improve FOTO to >/= 69% function.    Baseline 54% function on 06/28/2020    Time 6    Period Weeks    Status New      Additional Long Term Goals   Additional Long Term Goals Yes      PT LONG TERM GOAL #6   Title Pt will improve Rt hip PROM to >30 deg to improve function.    Baseline 20    Time 8    Period Weeks    Status New                   Plan - 07/12/20 1426     Clinical Impression Statement She relays early compliance with HEP and is already seeing some early progress. Continued to work to improve hip ROM and overall strength with good tolerance demonstrated today. She does have only 20 deg of  PROM for Rt hip ER noted today so we will work to improve this as well. Continue POC.    Personal Factors and Comorbidities Comorbidity 3+    Comorbidities prediabetic, OSA, diastolic dysfunction, Primary OA right hip, R knee pain, obesity, LBP, RAD, essential HTN, dyspnea, colonic polyps    Examination-Participation Restrictions Other;Cleaning;Community Activity    Stability/Clinical Decision Making Stable/Uncomplicated    Rehab Potential Good    PT Frequency 2x / week    PT Duration 6 weeks    PT Treatment/Interventions ADLs/Self Care Home Management;Cryotherapy;Electrical Stimulation;Moist Heat;Ultrasound;Gait training;Stair training;Functional mobility training;Traction;Therapeutic activities;Therapeutic exercise;Balance training;Patient/family education;Manual techniques;Dry  needling;Taping;Passive range of motion;Neuromuscular re-education;Iontophoresis 4mg /ml Dexamethasone    PT Next Visit Plan lumbar and Rt hip ER stretching, core strengthening,  STM to lumbar paraspinals and glutes as needed    PT Home Exercise Plan Access Code: [email or web address]  URL: [email or web address]  Date: 06/28/2020  Prepared by: Kearney Hard    Exercises  Supine Figure 4 Piriformis Stretch - 2 x daily - 7 x weekly - 5 reps - 10 seconds hold  Supine Piriformis Stretch with Foot on Ground - 2 x daily - 7 x weekly - 5 reps - 10 seconds hold  Supine Bridge - 2 x daily - 7 x weekly - 2 sets - 10 reps - 5 seconds hold  Standing Lumbar Extension at [place] - 2 x daily - 7 x weekly - 10 reps - 5 seconds hold  Hooklying Hamstring Stretch with Strap - 2 x daily - 7 x weekly - 5 reps - 30 seconds hold    Consulted and Agree with Plan of Care Patient             Patient will benefit from skilled therapeutic intervention in order to improve the following deficits and impairments:  Pain, Postural dysfunction, Decreased strength, Decreased mobility, Impaired flexibility, Decreased activity tolerance, Difficulty walking, Decreased range of motion  Visit Diagnosis: Chronic right-sided low back pain without sciatica  Muscle weakness (generalized)  Difficulty in walking, not elsewhere classified     Problem List Patient Active Problem List   Diagnosis Date Noted   Right carotid bruit 01/28/2020   OSA (obstructive sleep apnea) 07/21/2019   Coordination of complex care 07/21/2019   Grade I diastolic dysfunction 38/32/9191   Ascending aorta dilatation (HCC), mild 05/21/2019   Dyspnea on exertion 04/23/2019   Primary osteoarthritis of right hip 01/28/2018   Right knee pain 12/16/2017   Insomnia 12/24/2016   Obesity 07/04/2016   Internal hemorrhoids 07/04/2016   Lower back pain 12/23/2015   Prediabetes 11/04/2012   RAD (reactive airway disease) 12/03/2011   DIVERTICULOSIS,  COLON 08/12/2009   Hyperlipidemia 06/04/2006   Essential hypertension 06/04/2006   History of colonic polyps 06/04/2006    Silvestre Mesi 07/12/2020, 2:38 PM  [facility] [address] [place], [place], [address] Phone: [phone]   Fax:  [phone]  Name: CLARRISA KAYLOR MRN: [id number] Date of Birth: Apr 18, 1945

## 2020-07-17 DIAGNOSIS — G4733 Obstructive sleep apnea (adult) (pediatric): Secondary | ICD-10-CM | POA: Diagnosis not present

## 2020-07-18 ENCOUNTER — Encounter: Payer: Self-pay | Admitting: Rehabilitative and Restorative Service Providers"

## 2020-07-18 ENCOUNTER — Ambulatory Visit (INDEPENDENT_AMBULATORY_CARE_PROVIDER_SITE_OTHER): Payer: Medicare PPO | Admitting: Rehabilitative and Restorative Service Providers"

## 2020-07-18 ENCOUNTER — Other Ambulatory Visit: Payer: Self-pay

## 2020-07-18 ENCOUNTER — Encounter: Payer: Medicare PPO | Admitting: Physical Therapy

## 2020-07-18 DIAGNOSIS — G8929 Other chronic pain: Secondary | ICD-10-CM | POA: Diagnosis not present

## 2020-07-18 DIAGNOSIS — M6281 Muscle weakness (generalized): Secondary | ICD-10-CM | POA: Diagnosis not present

## 2020-07-18 DIAGNOSIS — R262 Difficulty in walking, not elsewhere classified: Secondary | ICD-10-CM

## 2020-07-18 DIAGNOSIS — M545 Low back pain, unspecified: Secondary | ICD-10-CM

## 2020-07-18 NOTE — Therapy (Signed)
[facility] Physical Therapy [address] [place], [place], [address] Phone: [phone]   Fax:  [phone]  Physical Therapy Treatment  Patient Details  Name: Sydney Robinson MRN: [id number] Date of Birth: 1945-04-29 Referring Provider (PT): Tiney Rouge PA-C   Encounter Date: 07/18/2020   PT End of Session - 07/18/20 1405     Visit Number 3    Number of Visits 12    Date for PT Re-Evaluation 08/12/20    Authorization Type humana    PT Start Time 0145    PT Stop Time 0234    PT Time Calculation (min) 49 min    Activity Tolerance Patient tolerated treatment well;No increased pain    Behavior During Therapy [facility] for tasks assessed/performed             Past Medical History:  Diagnosis Date   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   Subjective Assessment - 07/18/20 1348     Subjective 3/10 pain; just flipped bed last week; worse with transfers    Limitations Sitting;Lifting;Standing    Pain Score 3     Pain Location Back    Pain Orientation Right;Lower    Pain Descriptors / Indicators Aching;Sore    Pain Type Chronic pain                               [facility] Adult PT Treatment/Exercise - 07/18/20 0001       Exercises   Exercises Knee/Hip      Lumbar Exercises: Seated   Other Seated Lumbar Exercises seated trunk flexion stretch 4x30 sec      Lumbar Exercises: Supine   Other Supine Lumbar Exercises knee to  chest stretch R 2x30 sec; tilt x 20 with PT verbal and tactile cue for technique; iso tilt with 10 breaths; tilt with march x 20; tilt with SLR x 15 each LE, glute set/tilt with bil clam shell x 20      Knee/Hip Exercises: Supine   Other Supine Knee/Hip Exercises R hip IR/ER to neutral with knee extended    Other Supine Knee/Hip Exercises good morning stretch x 10; attempted leg lengthener R but pt unable to feel stretch                    PT Education - 07/18/20 1435     Education Details pt reports feeling lightheaded. PT advised her to reach out to her primary MD and cardiologist. She said she has not told them it is still going on. Discussed if BP issue, sugar issue, vertigo, etc. PT advised her to call both MDs today. Discussed pelvic tilt to assist with decreasing lumbar strain. Discussed when supine to  place pillow under knees to relieve lumbar pressure. Pt agreed to try.    Person(s) Educated Patient    Methods Explanation    Comprehension Verbalized understanding              PT Short Term Goals - 07/18/20 1407       PT SHORT TERM GOAL #1   Title Pt will be independent in her initial HEP    Status On-going      PT SHORT TERM GOAL #2   Title Pt will improve her 5 time sit to stand to </= 14 seconds    Status On-going               PT Long Term Goals - 07/12/20 1436       PT LONG TERM GOAL #1   Title Pt will be independent in her advanced HEP    Time 6    Period Weeks    Status New      PT LONG TERM GOAL #2   Title Pt will improve her bilateral hip strength to grossly 5/5.    Baseline see flow sheets    Time 6    Period Weeks    Status New      PT LONG TERM GOAL #3   Title Pt will be able to lift 15# from floor to over head with pain </= 2/10.    Baseline pain varies with bending and lifting    Time 6    Period Weeks      PT LONG TERM GOAL #4   Title Pt will be able to amb community distances wtih no pain for > 30 minutes.    Baseline pt  reporting having to stop after 15 minutes due to pain in low back    Time 6    Period Weeks    Status New      PT LONG TERM GOAL #5   Title Pt will improve FOTO to >/= 69% function.    Baseline 54% function on 06/28/2020    Time 6    Period Weeks    Status New      Additional Long Term Goals   Additional Long Term Goals Yes      PT LONG TERM GOAL #6   Title Pt will improve Rt hip PROM to >30 deg to improve function.    Baseline 20    Time 8    Period Weeks    Status New                   Plan - 07/18/20 1405     Clinical Impression Statement Pt states compliance with HEP and with exercises it has become easier to don her right sock. continue to work on Designer, multimedia and flexibility and R hip ROM.    PT Treatment/Interventions ADLs/Self Care Home Management;Cryotherapy;Electrical Stimulation;Moist Heat;Ultrasound;Gait training;Stair training;Functional mobility training;Traction;Therapeutic activities;Therapeutic exercise;Balance training;Patient/family education;Manual techniques;Dry needling;Taping;Passive range of motion;Neuromuscular re-education;Iontophoresis 4mg /ml Dexamethasone    PT Next Visit Plan lumbar and Rt hip ER stretching, core strengthening,  STM to lumbar paraspinals and glutes as needed    Consulted and Agree with Plan of Care Patient             Patient will benefit from skilled therapeutic intervention in order to improve the following deficits and impairments:  Pain, Postural dysfunction, Decreased strength, Decreased mobility, Impaired flexibility, Decreased activity tolerance, Difficulty walking, Decreased range of motion  Visit Diagnosis: Chronic right-sided low back  pain without sciatica  Muscle weakness (generalized)  Difficulty in walking, not elsewhere classified     Problem List Patient Active Problem List   Diagnosis Date Noted   Right carotid bruit 01/28/2020   OSA (obstructive sleep apnea) 07/21/2019    Coordination of complex care 07/21/2019   Grade I diastolic dysfunction 12/82/0813   Ascending aorta dilatation ([place]), mild 05/21/2019   Dyspnea on exertion 04/23/2019   Primary osteoarthritis of right hip 01/28/2018   Right knee pain 12/16/2017   Insomnia 12/24/2016   Obesity 07/04/2016   Internal hemorrhoids 07/04/2016   Lower back pain 12/23/2015   Prediabetes 11/04/2012   RAD (reactive airway disease) 12/03/2011   DIVERTICULOSIS, COLON 08/12/2009   Hyperlipidemia 06/04/2006   Essential hypertension 06/04/2006   History of colonic polyps 06/04/2006    America Brown, PT, DPT 07/18/2020, 5:34 PM  [facility] Physical Therapy [address] [place], [place], [address] Phone: [phone]   Fax:  [phone]  Name: Sydney Robinson MRN: [id number] Date of Birth: June 20, 1945

## 2020-07-20 ENCOUNTER — Other Ambulatory Visit: Payer: Self-pay

## 2020-07-20 ENCOUNTER — Ambulatory Visit (INDEPENDENT_AMBULATORY_CARE_PROVIDER_SITE_OTHER): Payer: Medicare PPO | Admitting: Rehabilitative and Restorative Service Providers"

## 2020-07-20 ENCOUNTER — Encounter: Payer: Self-pay | Admitting: Rehabilitative and Restorative Service Providers"

## 2020-07-20 ENCOUNTER — Encounter: Payer: Medicare PPO | Admitting: Physical Therapy

## 2020-07-20 DIAGNOSIS — M6281 Muscle weakness (generalized): Secondary | ICD-10-CM

## 2020-07-20 DIAGNOSIS — M545 Low back pain, unspecified: Secondary | ICD-10-CM

## 2020-07-20 DIAGNOSIS — G8929 Other chronic pain: Secondary | ICD-10-CM | POA: Diagnosis not present

## 2020-07-20 DIAGNOSIS — R262 Difficulty in walking, not elsewhere classified: Secondary | ICD-10-CM | POA: Diagnosis not present

## 2020-07-20 NOTE — Therapy (Signed)
[facility] Physical Therapy [address] [place], [place], [address] Phone: [phone]   Fax:  [phone]  Physical Therapy Treatment  Patient Details  Name: Sydney Robinson MRN: [id number] Date of Birth: November 30, 1945 Referring Provider (PT): Tiney Rouge PA-C   Encounter Date: 07/20/2020   PT End of Session - 07/20/20 1437     Visit Number 4    Number of Visits 12    Date for PT Re-Evaluation 08/12/20    Authorization Type humana    Progress Note Due on Visit 10    PT Start Time 1430    PT Stop Time 1509    PT Time Calculation (min) 39 min    Activity Tolerance Patient tolerated treatment well    Behavior During Therapy [facility] for tasks assessed/performed             Past Medical History:  Diagnosis Date   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   Subjective Assessment - 07/20/20 1434     Subjective Pt. indicated 3-4/10 on Rt side of back noted c movement/transfers.  Pt. indicated doing better overall.  Some complaint here and there c exercises at home in leg.  Rated about 60-70% overall improvement.    Pertinent History prediabetic, OSA, diastolic dysfunction, Primary OA right hip, R knee pain, obesity, LBP, RAD, essential HTN, dyspnea, colonic polyps    Limitations Sitting;Lifting;Standing    How long can you sit comfortably? unrestricted    How long can you walk comfortably? unrestricted    Diagnostic tests X-ray with moderate  degenerative changes to L4-5    Patient Stated Goals get rid of pain completely    Currently in Pain? Yes    Pain Score 3    at worst   Pain Location Back    Pain Orientation Right;Lower    Pain Descriptors / Indicators Aching;Sore    Pain Type Chronic pain    Pain Onset More than a month ago    Pain Frequency Intermittent    Aggravating Factors  transfers, movement at times    Pain Relieving Factors overall intervention has helped.                               [facility] Adult PT Treatment/Exercise - 07/20/20 0001       Lumbar Exercises: Stretches   Single Knee to Chest Stretch Left;Right;3 reps;20 seconds    Lower Trunk Rotation 10 seconds;5 reps      Lumbar Exercises: Aerobic   Nustep Lvl 6 10 mins UE/LE      Lumbar Exercises: Machines for Strengthening   Leg Press 87# DL 3X10      Lumbar Exercises: Standing   Other Standing Lumbar  Exercises extension standing 2 x 5      Lumbar Exercises: Supine   Bridge 5 seconds;10 reps    Other Supine Lumbar Exercises posterior tilt c SLR x 15 bilateral                      PT Short Term Goals - 07/20/20 1453       PT SHORT TERM GOAL #1   Title Pt will be independent in her initial HEP    Status Achieved      PT SHORT TERM GOAL #2   Title Pt will improve her 5 time sit to stand to </= 14 seconds    Status On-going               PT Long Term Goals - 07/12/20 1436       PT LONG TERM GOAL #1   Title Pt will be independent in her advanced HEP    Time 6    Period Weeks    Status New      PT LONG TERM GOAL #2   Title Pt will improve her bilateral hip strength to grossly 5/5.    Baseline see flow sheets    Time 6    Period Weeks    Status New      PT LONG TERM GOAL #3   Title Pt will be able to lift 15# from floor to over head with pain </= 2/10.    Baseline pain varies with bending and lifting    Time 6    Period Weeks      PT LONG TERM GOAL #4   Title Pt will be able to amb  community distances wtih no pain for > 30 minutes.    Baseline pt reporting having to stop after 15 minutes due to pain in low back    Time 6    Period Weeks    Status New      PT LONG TERM GOAL #5   Title Pt will improve FOTO to >/= 69% function.    Baseline 54% function on 06/28/2020    Time 6    Period Weeks    Status New      Additional Long Term Goals   Additional Long Term Goals Yes      PT LONG TERM GOAL #6   Title Pt will improve Rt hip PROM to >30 deg to improve function.    Baseline 20    Time 8    Period Weeks    Status New                   Plan - 07/20/20 1451     Clinical Impression Statement Pt. reported progression at this time in overall function and improvement (60-70%).  Pt. expressed desire to continue to improve hip strength and mobility to improve function in daily activity such as stairs, squatting, lifting/carrying movements.  Continued to include intervention to improve lumbar mobility as well.    Personal Factors and Comorbidities Comorbidity 3+    Comorbidities prediabetic, OSA, diastolic dysfunction, Primary OA right hip, R knee pain, obesity, LBP, RAD, essential HTN, dyspnea, colonic polyps    Examination-Participation Restrictions Other;Cleaning;Community Activity    PT Treatment/Interventions ADLs/Self Care Home Management;Cryotherapy;Electrical Stimulation;Moist Heat;Ultrasound;Gait training;Stair training;Functional mobility training;Traction;Therapeutic activities;Therapeutic exercise;Balance training;Patient/family education;Manual techniques;Dry needling;Taping;Passive range of motion;Neuromuscular re-education;Iontophoresis 4mg /ml Dexamethasone    PT Next Visit Plan Continue lumbar/hip mobility and strength gains.    PT Home Exercise  Plan Access Code: [email or web address]  URL: [email or web address]  Date: 06/28/2020  Prepared by: Kearney Hard    Exercises  Supine Figure 4 Piriformis Stretch - 2 x daily - 7 x weekly - 5 reps - 10  seconds hold  Supine Piriformis Stretch with Foot on Ground - 2 x daily - 7 x weekly - 5 reps - 10 seconds hold  Supine Bridge - 2 x daily - 7 x weekly - 2 sets - 10 reps - 5 seconds hold  Standing Lumbar Extension at [place] - 2 x daily - 7 x weekly - 10 reps - 5 seconds hold  Hooklying Hamstring Stretch with Strap - 2 x daily - 7 x weekly - 5 reps - 30 seconds hold    Consulted and Agree with Plan of Care Patient             Patient will benefit from skilled therapeutic intervention in order to improve the following deficits and impairments:  Pain, Postural dysfunction, Decreased strength, Decreased mobility, Impaired flexibility, Decreased activity tolerance, Difficulty walking, Decreased range of motion  Visit Diagnosis: Chronic right-sided low back pain without sciatica  Muscle weakness (generalized)  Difficulty in walking, not elsewhere classified     Problem List Patient Active Problem List   Diagnosis Date Noted   Right carotid bruit 01/28/2020   OSA (obstructive sleep apnea) 07/21/2019   Coordination of complex care 07/21/2019   Grade I diastolic dysfunction 62/56/3893   Ascending aorta dilatation (HCC), mild 05/21/2019   Dyspnea on exertion 04/23/2019   Primary osteoarthritis of right hip 01/28/2018   Right knee pain 12/16/2017   Insomnia 12/24/2016   Obesity 07/04/2016   Internal hemorrhoids 07/04/2016   Lower back pain 12/23/2015   Prediabetes 11/04/2012   RAD (reactive airway disease) 12/03/2011   DIVERTICULOSIS, COLON 08/12/2009   Hyperlipidemia 06/04/2006   Essential hypertension 06/04/2006   History of colonic polyps 06/04/2006   Scot Jun, PT, DPT, OCS, ATC 07/20/20  3:09 PM    [facility] [address] [place], [place], [address] Phone: [phone]   Fax:  [phone]  Name: GENIFER LAZENBY MRN: [id number] Date of Birth: 01-07-1946

## 2020-07-25 ENCOUNTER — Encounter: Payer: Medicare PPO | Admitting: Physical Therapy

## 2020-07-27 ENCOUNTER — Encounter: Payer: Medicare PPO | Admitting: Physical Therapy

## 2020-07-27 ENCOUNTER — Ambulatory Visit (INDEPENDENT_AMBULATORY_CARE_PROVIDER_SITE_OTHER): Payer: Medicare PPO | Admitting: Physical Therapy

## 2020-07-27 ENCOUNTER — Other Ambulatory Visit: Payer: Self-pay

## 2020-07-27 ENCOUNTER — Encounter: Payer: Self-pay | Admitting: Physical Therapy

## 2020-07-27 DIAGNOSIS — M545 Low back pain, unspecified: Secondary | ICD-10-CM

## 2020-07-27 DIAGNOSIS — M6281 Muscle weakness (generalized): Secondary | ICD-10-CM | POA: Diagnosis not present

## 2020-07-27 DIAGNOSIS — R262 Difficulty in walking, not elsewhere classified: Secondary | ICD-10-CM | POA: Diagnosis not present

## 2020-07-27 DIAGNOSIS — G8929 Other chronic pain: Secondary | ICD-10-CM

## 2020-07-27 NOTE — Therapy (Addendum)
[facility] Physical Therapy [address] [place], [place], [address] Phone: [phone]   Fax:  [phone]  Physical Therapy Treatment/Discharge  Patient Details  Name: Sydney Robinson MRN: [id number] Date of Birth: May 14, 1945 Referring Provider (PT): Tiney Rouge PA-C   Encounter Date: 07/27/2020   PT End of Session - 07/27/20 1007     Visit Number 5    Number of Visits 12    Date for PT Re-Evaluation 08/12/20    Authorization Type humana    Authorization Time Period 6/21-8/5    Authorization - Visit Number 5    Authorization - Number of Visits 12    Progress Note Due on Visit 10    PT Start Time 0930    PT Stop Time 1010    PT Time Calculation (min) 40 min    Activity Tolerance Patient tolerated treatment well    Behavior During Therapy [facility] for tasks assessed/performed             Past Medical History:  Diagnosis Date   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   Subjective Assessment - 07/27/20 0931     Subjective pt reports back feels better, having some pain in Rt ant hip.    Pertinent History prediabetic, OSA, diastolic dysfunction, Primary OA right hip, R knee pain, obesity, LBP, RAD, essential HTN, dyspnea, colonic polyps    Limitations Sitting;Lifting;Standing    How long can you sit comfortably? unrestricted    How long can you walk comfortably? unrestricted    Diagnostic tests X-ray with moderate degenerative changes to  L4-5    Patient Stated Goals get rid of pain completely    Currently in Pain? No/denies                [facility] PT Assessment - 07/27/20 1010       Assessment   Medical Diagnosis M54.50 chronic right sided LBP without sciatica    Referring Provider (PT) Tiney Rouge PA-C      Transfers   Five time sit to stand comments  19 seconds with no UE support                           [facility] Adult PT Treatment/Exercise - 07/27/20 0928       Lumbar Exercises: Stretches   Single Knee to Chest Stretch Left;Right;3 reps;20 seconds    Lower Trunk Rotation 10 seconds;5 reps    Figure 4 Stretch 3 reps;30 seconds;With overpressure;Seated      Lumbar Exercises: Aerobic   Nustep Lvl 6 10 mins UE/LE      Lumbar Exercises: Supine   Bridge 5 seconds;10 reps    Other Supine Lumbar Exercises single limb clamshell with L3 band  2x10 bil                      PT Short Term Goals - 07/27/20 1008       PT SHORT TERM GOAL #1   Title Pt will be independent in her initial HEP    Status Achieved      PT SHORT TERM GOAL #2   Title Pt will improve her 5 time sit to stand to </= 14 seconds    Baseline 19 seconds no UE support    Status On-going               PT Long Term Goals - 07/12/20 1436       PT LONG TERM GOAL #1   Title Pt will be independent in her advanced HEP    Time 6    Period Weeks    Status New      PT LONG TERM GOAL #2   Title Pt will improve her bilateral hip strength to grossly 5/5.    Baseline see flow sheets    Time 6    Period Weeks    Status New      PT LONG TERM GOAL #3   Title Pt will be able to lift 15# from floor to over head with pain </= 2/10.    Baseline pain varies with bending and lifting    Time 6    Period Weeks      PT LONG TERM GOAL #4   Title Pt will be able to amb community distances wtih no pain for > 30 minutes.    Baseline pt reporting having to stop after 15 minutes due to pain in low back    Time 6     Period Weeks    Status New      PT LONG TERM GOAL #5   Title Pt will improve FOTO to >/= 69% function.    Baseline 54% function on 06/28/2020    Time 6    Period Weeks    Status New      Additional Long Term Goals   Additional Long Term Goals Yes      PT LONG TERM GOAL #6   Title Pt will improve Rt hip PROM to >30 deg to improve function.    Baseline 20    Time 8    Period Weeks    Status New                   Plan - 07/27/20 1010     Clinical Impression Statement 5xSTS unchanged today but pt reports decreasing pain and improved functional mobility with ADLs.  She wants to decr to 1x/wk then eventually go on hold from PT after 2 additional visits if she continues to do well.    Personal Factors and Comorbidities Comorbidity 3+    Comorbidities prediabetic, OSA, diastolic dysfunction, Primary OA right hip, R knee pain, obesity, LBP, RAD, essential HTN, dyspnea, colonic polyps    Examination-Participation Restrictions Other;Cleaning;Community Activity    PT Treatment/Interventions ADLs/Self Care Home Management;Cryotherapy;Electrical Stimulation;Moist Heat;Ultrasound;Gait training;Stair training;Functional mobility training;Traction;Therapeutic activities;Therapeutic exercise;Balance training;Patient/family education;Manual techniques;Dry needling;Taping;Passive range of motion;Neuromuscular re-education;Iontophoresis 68m/ml Dexamethasone    PT Next Visit Plan Continue lumbar/hip mobility and strength gains.    PT Home Exercise Plan Access Code: [email or web address]    Consulted and Agree with Plan of Care Patient             Patient will benefit from skilled  therapeutic intervention in order to improve the following deficits and impairments:  Pain, Postural dysfunction, Decreased strength, Decreased mobility, Impaired flexibility, Decreased activity tolerance, Difficulty walking, Decreased range of motion  Visit Diagnosis: Chronic right-sided low back pain without  sciatica  Muscle weakness (generalized)  Difficulty in walking, not elsewhere classified     Problem List Patient Active Problem List   Diagnosis Date Noted   Right carotid bruit 01/28/2020   OSA (obstructive sleep apnea) 07/21/2019   Coordination of complex care 07/21/2019   Grade I diastolic dysfunction 71/85/5015   Ascending aorta dilatation ([place]), mild 05/21/2019   Dyspnea on exertion 04/23/2019   Primary osteoarthritis of right hip 01/28/2018   Right knee pain 12/16/2017   Insomnia 12/24/2016   Obesity 07/04/2016   Internal hemorrhoids 07/04/2016   Lower back pain 12/23/2015   Prediabetes 11/04/2012   RAD (reactive airway disease) 12/03/2011   DIVERTICULOSIS, COLON 08/12/2009   Hyperlipidemia 06/04/2006   Essential hypertension 06/04/2006   History of colonic polyps 06/04/2006     Laureen Abrahams, PT, DPT 07/27/20 10:13 AM  PHYSICAL THERAPY DISCHARGE SUMMARY  Visits from Start of Care: 5  Current functional level related to goals / functional outcomes: See note   Remaining deficits: See note   Education / Equipment: HEP   Patient agrees to discharge. Patient goals were partially met. Patient is being discharged due to not returning since the last visit.  Scot Jun, PT, DPT, OCS, ATC 10/06/20  9:18 AM      [facility] [address] [place], [place], [address] Phone: [phone]   Fax:  [phone]  Name: ALTOVISE WAHLER MRN: [id number] Date of Birth: 1945/11/03

## 2020-08-01 ENCOUNTER — Encounter: Payer: Medicare PPO | Admitting: Physical Therapy

## 2020-08-03 ENCOUNTER — Encounter: Payer: Medicare PPO | Admitting: Physical Therapy

## 2020-08-08 ENCOUNTER — Encounter: Payer: Medicare PPO | Admitting: Physical Therapy

## 2020-08-10 ENCOUNTER — Encounter: Payer: Medicare PPO | Admitting: Physical Therapy

## 2020-08-17 DIAGNOSIS — G4733 Obstructive sleep apnea (adult) (pediatric): Secondary | ICD-10-CM | POA: Diagnosis not present

## 2020-10-25 ENCOUNTER — Other Ambulatory Visit: Payer: Self-pay

## 2020-10-25 ENCOUNTER — Encounter: Payer: Self-pay | Admitting: Internal Medicine

## 2020-10-25 ENCOUNTER — Ambulatory Visit (INDEPENDENT_AMBULATORY_CARE_PROVIDER_SITE_OTHER): Payer: Medicare PPO | Admitting: Internal Medicine

## 2020-10-25 VITALS — BP 118/88 | HR 61 | Temp 98.3°F | Ht 67.0 in | Wt 200.2 lb

## 2020-10-25 DIAGNOSIS — Z9989 Dependence on other enabling machines and devices: Secondary | ICD-10-CM

## 2020-10-25 DIAGNOSIS — G4733 Obstructive sleep apnea (adult) (pediatric): Secondary | ICD-10-CM

## 2020-10-25 MED ORDER — CLONAZEPAM 0.5 MG PO TABS: ORAL_TABLET | ORAL | 0 refills | Status: DC

## 2020-10-25 NOTE — Patient Instructions (Signed)
Please schedule follow up scheduled with myself in 6 months.  If my schedule is not open yet, we will contact you with a reminder closer to that time.

## 2020-10-25 NOTE — Progress Notes (Signed)
Sydney Robinson    [id number]    19-Jul-1945  Primary Care Physician:Burns, Claudina Lick, MD Date of Appointment: 10/25/2020 Established Patient Visit  Chief complaint:   Chief Complaint  Patient presents with   Follow-up    CPAP compliance      HPI: Sydney Robinson is a 75 y.o. with shortness of breath and OSA who presents for follow up.    Interval Updates: Here for follow up for OSA.  CPAP download reviewed - excellent adherence. Minimal leakage.   Daughter notes she is sometimes short of breath, patient herself doesn't notice it. Doesn't perceive any functional limitations other than walking more slowly than she used to. No chest tightness, coughing, wheezing. Hasn't been taking albuterol prn. Checks puls ox at home, no desaturations.   She takes sometimes a 1/2 tab of clonazepam less than once/week to help with sleep before putting on cpap.   I have reviewed the patient's family social and past medical history and updated as appropriate.   Past Medical History:  Diagnosis Date   Colon polyps 1999   adenomatous   Diverticulosis    Hyperlipidemia    Hypertension     Past Surgical History:  Procedure Laterality Date   BREAST EXCISIONAL BIOPSY Bilateral 1970s   CATARACT EXTRACTION Right    COLONOSCOPY W/ POLYPECTOMY  1998    negative 2003 & 2008; 2013   DILATION AND CURETTAGE OF UTERUS     x 3   fibroidectomy     x3   g2 p2     hemorrhoidal banding  2011   Dr Earlean Shawl   Neuroma Resected Foot     Right    OVARIAN CYST REMOVAL     ROTATOR CUFF REPAIR     TONSILLECTOMY      Family History  Problem Relation Age of Onset   Diabetes Mother    Alzheimer's disease Mother    Heart failure Mother    Glaucoma Mother    Arthritis Mother    Heart attack Maternal Aunt 48   Stroke Paternal Aunt 72       cns aneurysm   Uterine cancer Paternal 45    Breast cancer Sister        2 sisters    Heart disease Sister    Diverticulitis Sister    Breast cancer  Sister 75   Glaucoma Sister    Diabetes Maternal Grandmother    Diabetes Other        MGGM   Colon cancer Other        cousin   Diabetes Maternal Aunt    Diabetes Maternal Uncle    Heart attack Maternal Uncle    Diabetes Paternal Aunt    Stroke Maternal Grandfather     Social History   Occupational History   Not on file  Tobacco Use   Smoking status: Never   Smokeless tobacco: Never  Substance and Sexual Activity   Alcohol use: No    Alcohol/week: 0.0 standard drinks    Comment: Wine rarely   Drug use: No   Sexual activity: Not on file     Physical Exam: Blood pressure 118/88, pulse 61, temperature 98.3 F (36.8 C), temperature source Oral, height 5\' 7"  (1.245 m), weight 200 lb 3.2 oz (90.8 kg), SpO2 100 %.  Gen:      No acute distress Lungs:    ctab no wheezes or crackles CV:  RRR no mrg no edema   Data Reviewed: Imaging: I have personally reviewed the chest xray April 2021. No acute process.   PFTs:   PFT Results Latest Ref Rng & Units 08/21/2019  FVC-Pre L 2.46  FVC-Predicted Pre % 92  FVC-Post L 2.53  FVC-Predicted Post % 95  Pre FEV1/FVC % % 81  Post FEV1/FCV % % 85  FEV1-Pre L 1.99  FEV1-Predicted Pre % 96  FEV1-Post L 2.14  DLCO uncorrected ml/min/mmHg 18.24  DLCO UNC% % 85  DLCO corrected ml/min/mmHg 18.24  DLCO COR %Predicted % 85  DLVA Predicted % 111  TLC L 4.22  TLC % Predicted % 76  RV % Predicted % 77   I have personally reviewed the patient's PFTs and there is no airflow limitation and mild restriction to ventilation likely secondary to body habitus.  Echocardiogram 05/20/2019  1. Left ventricular ejection fraction, by estimation, is 60 to 65%. The  left ventricle has normal function. The left ventricle has no regional  wall motion abnormalities. Left ventricular diastolic parameters are  consistent with Grade I diastolic  dysfunction (impaired relaxation).   2. Right ventricular systolic function is normal. The right  ventricular  size is normal. Tricuspid regurgitation signal is inadequate for assessing  PA pressure.   3. The mitral valve is abnormal. Trivial mitral valve regurgitation.   4. The aortic valve is tricuspid. Aortic valve regurgitation is not  visualized. Mild aortic valve sclerosis is present, with no evidence of  aortic valve stenosis.   5. Aortic dilatation noted. 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   6. The inferior vena cava is normal in size with greater than 50%  respiratory variability, suggesting right atrial pressure of 3 mmHg.  Labs: Lab Results  Component Value Date   WBC 4.9 01/28/2020   HGB 13.8 01/28/2020   HCT 41.2 01/28/2020   MCV 86.5 01/28/2020   PLT 206.0 01/28/2020   Lab Results  Component Value Date   NA 141 01/28/2020   K 4.1 01/28/2020   CL 107 01/28/2020   CO2 29 01/28/2020    Immunization status: Immunization History  Administered Date(s) Administered   Fluad Quad(high Dose 65+) 08/27/2020   Influenza Split 11/15/2010   Influenza Whole 01/08/2001, 10/08/2007   Influenza, High Dose Seasonal PF 11/01/2014, 10/08/2017, 08/26/2018   Influenza, Seasonal, Injecte, Preservative Fre 12/03/2011   Influenza,inj,Quad PF,6+ Mos 10/13/2013   Influenza-Unspecified 10/14/2012, 11/01/2014, 10/05/2015, 10/08/2016, 08/20/2018, 08/26/2019   PFIZER(Purple Top)SARS-COV-2 Vaccination 02/24/2019, 03/06/2019, 10/05/2019   Pneumococcal Conjugate-13 12/14/2014   Pneumococcal Polysaccharide-23 12/23/2015   Td 07/29/2008    Assessment:  Shortness of Breath with mild restriction to ventilation OSA on CPAP Allergic Rhinitis   Plan/Recommendations: I reviewed her sleep download today. Excellent adherence and efficacy. Minimal leakage.  Regarding dyspnea, she has a prn albuterol at home that she can take if she gets dyspnea.  I've encouraged her to start walking and implementing a regular exercise routine to help with conditioning.  Will  refill clonazepam low dose prn for help with sleep.   Return to Care: Return in about 6 months (around 04/25/2021).   Lenice Llamas, MD Pulmonary and [place]

## 2021-01-10 ENCOUNTER — Other Ambulatory Visit: Payer: Self-pay | Admitting: Internal Medicine

## 2021-01-10 DIAGNOSIS — Z1231 Encounter for screening mammogram for malignant neoplasm of breast: Secondary | ICD-10-CM

## 2021-01-12 ENCOUNTER — Encounter: Payer: Self-pay | Admitting: Internal Medicine

## 2021-01-14 MED ORDER — HYDROCHLOROTHIAZIDE 12.5 MG PO CAPS: ORAL_CAPSULE | ORAL | 0 refills | Status: DC

## 2021-01-26 ENCOUNTER — Ambulatory Visit
Admission: RE | Admit: 2021-01-26 | Discharge: 2021-01-26 | Disposition: A | Discharge status: Home / Self Care | Payer: Medicare PPO | Source: Ambulatory Visit | Attending: Internal Medicine | Admitting: Internal Medicine

## 2021-01-26 DIAGNOSIS — Z1231 Encounter for screening mammogram for malignant neoplasm of breast: Secondary | ICD-10-CM | POA: Diagnosis not present

## 2021-01-27 ENCOUNTER — Other Ambulatory Visit: Payer: Self-pay | Admitting: Internal Medicine

## 2021-01-27 DIAGNOSIS — R928 Other abnormal and inconclusive findings on diagnostic imaging of breast: Secondary | ICD-10-CM

## 2021-01-30 ENCOUNTER — Encounter: Payer: Self-pay | Admitting: Internal Medicine

## 2021-01-30 ENCOUNTER — Other Ambulatory Visit: Payer: Self-pay | Admitting: Internal Medicine

## 2021-01-30 DIAGNOSIS — R0602 Shortness of breath: Secondary | ICD-10-CM

## 2021-01-30 NOTE — Progress Notes (Signed)
Subjective:    Patient ID: Sydney Robinson, female    DOB: 26-Aug-1945, 76 y.o.   MRN: [id number]   This visit occurred during the SARS-CoV-2 public health emergency.  Safety protocols were in place, including screening questions prior to the visit, additional usage of staff PPE, and extensive cleaning of exam room while observing appropriate contact time as indicated for disinfecting solutions.    HPI She is here for a physical exam.   She had a recent mammogram and needs follow up -- 2/1 scheduled.  She fainted on the 26th of Dec.  She was at the hair salon.  She has seen cardio.  Probable vasovagal.    Was having higher BP's   - she asked me to send in the hctz 12.5 mg - was on it before.  BP has been good with both medicaitons.   Medications and allergies reviewed with patient and updated if appropriate.  Patient Active Problem List   Diagnosis Date Noted   Right carotid bruit 01/28/2020   OSA (obstructive sleep apnea) 07/21/2019   Coordination of complex care 07/21/2019   Grade I diastolic dysfunction 00/86/7619   Ascending aorta dilatation ([place]), mild 05/21/2019   Dyspnea on exertion 04/23/2019   Primary osteoarthritis of right hip 01/28/2018   Right knee pain 12/16/2017   Insomnia 12/24/2016   Obesity 07/04/2016   Internal hemorrhoids 07/04/2016   Lower back pain 12/23/2015   Prediabetes 11/04/2012   RAD (reactive airway disease) 12/03/2011   DIVERTICULOSIS, COLON 08/12/2009   Hyperlipidemia 06/04/2006   Essential hypertension 06/04/2006   History of colonic polyps 06/04/2006    Current Outpatient Medications on File Prior to Visit  Medication Sig Dispense Refill   albuterol (VENTOLIN HFA) 108 (90 Base) MCG/ACT inhaler Inhale 2 puffs into the lungs every 6 (six) hours as needed for wheezing or shortness of breath. 18 g 5   Cholecalciferol (VITAMIN D3 PO) Take 1 tablet by mouth as needed.      clonazePAM (KLONOPIN) 0.5 MG tablet TAKE 1/2-1 TABLET AT BEDTIME AS  NEEDED 30 tablet 0   fexofenadine (ALLEGRA) 60 MG tablet Take 60 mg by mouth 2 (two) times daily.     hydrochlorothiazide (MICROZIDE) 12.5 MG capsule TAKE 1 CAPSULE BY MOUTH DAILY. 30 capsule 0   labetalol (NORMODYNE) 300 MG tablet TAKE HALF A TABLETS (150 MG TOTAL) BY MOUTH 2 (TWO) TIMES DAILY. 90 tablet 7   polycarbophil (FIBERCON) 625 MG tablet Take 625 mg by mouth daily.     simvastatin (ZOCOR) 40 MG tablet TAKE 1 TABLET BY MOUTH EVERYDAY AT BEDTIME 90 tablet 3   No current facility-administered medications on file prior to visit.    Past Medical History:  Diagnosis Date   Colon polyps 1999   adenomatous   Diverticulosis    Hyperlipidemia    Hypertension     Past Surgical History:  Procedure Laterality Date   BREAST EXCISIONAL BIOPSY Bilateral 1970s   CATARACT EXTRACTION Right    COLONOSCOPY W/ POLYPECTOMY  1998    negative 2003 & 2008; 2013   DILATION AND CURETTAGE OF UTERUS     x 3   fibroidectomy     x3   g2 p2     hemorrhoidal banding  2011   Dr Earlean Shawl   Neuroma Resected Foot     Right    OVARIAN CYST REMOVAL     ROTATOR CUFF REPAIR     TONSILLECTOMY      Social History  Socioeconomic History   Marital status: Widowed    Spouse name: Not on file   Number of children: Not on file   Years of education: Not on file   Highest education level: Not on file  Occupational History   Not on file  Tobacco Use   Smoking status: Never   Smokeless tobacco: Never  Substance and Sexual Activity   Alcohol use: No    Alcohol/week: 0.0 standard drinks    Comment: Wine rarely   Drug use: No   Sexual activity: Not on file  Other Topics Concern   Not on file  Social History Narrative   Not on file   Social Determinants of Health   Financial Resource Strain: Low Risk    Difficulty of Paying Living Expenses: Not hard at all  Food Insecurity: No Food Insecurity   Worried About Charity fundraiser in the Last Year: Never true   [place] in the Last Year:  Never true  Transportation Needs: No Transportation Needs   Lack of Transportation (Medical): No   Lack of Transportation (Non-Medical): No  Physical Activity: Sufficiently Active   Days of Exercise per Week: 5 days   Minutes of Exercise per Session: 30 min  Stress: No Stress Concern Present   Feeling of Stress : Not at all  Social Connections: Moderately Integrated   Frequency of Communication with Friends and Family: More than three times a week   Frequency of Social Gatherings with Friends and Family: More than three times a week   Attends Religious Services: More than 4 times per year   Active Member of [employer] or Organizations: Yes   Attends Archivist Meetings: More than 4 times per year   Marital Status: Widowed    Family History  Problem Relation Age of Onset   Diabetes Mother    Alzheimer's disease Mother    Heart failure Mother    Glaucoma Mother    Arthritis Mother    Heart attack Maternal Aunt 45   Stroke Paternal Aunt 63       cns aneurysm   Uterine cancer Paternal 27    Breast cancer Sister        2 sisters    Heart disease Sister    Diverticulitis Sister    Breast cancer Sister 51   Glaucoma Sister    Diabetes Maternal Grandmother    Diabetes Other        MGGM   Colon cancer Other        cousin   Diabetes Maternal Aunt    Diabetes Maternal Uncle    Heart attack Maternal Uncle    Diabetes Paternal Aunt    Stroke Maternal Grandfather     Review of Systems  Constitutional:  Negative for chills and fever.  Eyes:  Negative for visual disturbance.  Respiratory:  Positive for shortness of breath (with strenuous activity). Negative for cough and wheezing.   Cardiovascular:  Negative for chest pain, palpitations and leg swelling.  Gastrointestinal:  Negative for abdominal pain, blood in stool, constipation, diarrhea and nausea.       No gerd  Genitourinary:  Negative for dysuria and hematuria.  Musculoskeletal:  Positive for back pain (changing  positions causes pain). Negative for arthralgias.       Muscle cramping at night  Skin:  Negative for color change and rash.  Neurological:  Positive for headaches (occ). Negative for dizziness and light-headedness.  Psychiatric/Behavioral:  Negative for dysphoric mood.  The patient is not nervous/anxious.       Objective:   Vitals:   01/31/21 0951  BP: 120/70  Pulse: 63  Temp: 98 F (36.7 C)  SpO2: 98%   Filed Weights   01/31/21 0951  Weight: 201 lb (91.2 kg)   Body mass index is 31.48 kg/m.  BP Readings from Last 3 Encounters:  01/31/21 120/70  10/25/20 118/88  06/02/20 (!) 152/80    Wt Readings from Last 3 Encounters:  01/31/21 201 lb (91.2 kg)  10/25/20 200 lb 3.2 oz (90.8 kg)  06/23/20 200 lb (90.7 kg)    Depression screen [facility] 2/9 02/18/2020 01/28/2020 01/20/2019 12/25/2016 12/23/2015  Decreased Interest 0 0 0 0 0  Down, Depressed, Hopeless 0 0 0 0 0  PHQ - 2 Score 0 0 0 0 0     No flowsheet data found.     Physical Exam Constitutional: She appears well-developed and well-nourished. No distress.  HENT:  Head: Normocephalic and atraumatic.  Right Ear: External ear normal. Normal ear canal and TM Left Ear: External ear normal.  Normal ear canal and TM Mouth/Throat: Oropharynx is clear and moist.  Eyes: Conjunctivae and EOM are normal.  Neck: Neck supple. No tracheal deviation present. No thyromegaly present.  No carotid bruit  Cardiovascular: Normal rate, regular rhythm and normal heart sounds.   No murmur heard.  No edema. Pulmonary/Chest: Effort normal and breath sounds normal. No respiratory distress. She has no wheezes. She has no rales.  Breast: deferred   Abdominal: Soft. She exhibits no distension. There is no tenderness.  Lymphadenopathy: She has no cervical adenopathy.  Skin: Skin is warm and dry. She is not diaphoretic.  Psychiatric: She has a normal mood and affect. Her behavior is normal.     Lab Results  Component Value Date   WBC 4.9  01/28/2020   HGB 13.8 01/28/2020   HCT 41.2 01/28/2020   PLT 206.0 01/28/2020   GLUCOSE 96 01/28/2020   CHOL 214 (H) 01/28/2020   TRIG 106.0 01/28/2020   HDL 79.20 01/28/2020   LDLDIRECT 130.5 12/29/2012   LDLCALC 114 (H) 01/28/2020   ALT 8 01/28/2020   AST 12 01/28/2020   NA 141 01/28/2020   K 4.1 01/28/2020   CL 107 01/28/2020   CREATININE 1.05 01/28/2020   BUN 12 01/28/2020   CO2 29 01/28/2020   TSH 2.17 01/28/2020   HGBA1C 5.6 01/28/2020         Assessment & Plan:   Physical exam: Screening blood work  ordered Exercise  walks sometimes 1-2 times a week Weight   advised weight loss Substance abuse  none   Reviewed recommended immunizations.   Health Maintenance  Topic Date Due   COVID-19 Vaccine (4 - Booster) 02/16/2021 (Originally 11/30/2019)   TETANUS/TDAP  01/31/2022 (Originally 07/30/2018)   COLONOSCOPY (Pts 45-30yrs Insurance coverage will need to be confirmed)  06/07/2021   MAMMOGRAM  01/27/2023   DEXA SCAN  03/02/2025   Pneumonia Vaccine 67+ Years old  Completed   INFLUENZA VACCINE  Completed   Hepatitis C Screening  Completed   HPV VACCINES  Aged Out   Zoster Vaccines- Shingrix  Discontinued          See Problem List for Assessment and Plan of chronic medical problems.

## 2021-01-30 NOTE — Patient Instructions (Addendum)
Blood work was ordered.      Medications changes include :   none     Please followup in 6 months    Health Maintenance, Female Adopting a healthy lifestyle and getting preventive care are important in promoting health and wellness. Ask your health care provider about: The right schedule for you to have regular tests and exams. Things you can do on your own to prevent diseases and keep yourself healthy. What should I know about diet, weight, and exercise? Eat a healthy diet  Eat a diet that includes plenty of vegetables, fruits, low-fat dairy products, and lean protein. Do not eat a lot of foods that are high in solid fats, added sugars, or sodium. Maintain a healthy weight Body mass index (BMI) is used to identify weight problems. It estimates body fat based on height and weight. Your health care provider can help determine your BMI and help you achieve or maintain a healthy weight. Get regular exercise Get regular exercise. This is one of the most important things you can do for your health. Most adults should: Exercise for at least 150 minutes each week. The exercise should increase your heart rate and make you sweat (moderate-intensity exercise). Do strengthening exercises at least twice a week. This is in addition to the moderate-intensity exercise. Spend less time sitting. Even light physical activity can be beneficial. Watch cholesterol and blood lipids Have your blood tested for lipids and cholesterol at 74 years of age, then have this test every 5 years. Have your cholesterol levels checked more often if: Your lipid or cholesterol levels are high. You are older than 74 years of age. You are at high risk for heart disease. What should I know about cancer screening? Depending on your health history and family history, you may need to have cancer screening at various ages. This may include screening for: Breast cancer. Cervical cancer. Colorectal cancer. Skin  cancer. Lung cancer. What should I know about heart disease, diabetes, and high blood pressure? Blood pressure and heart disease High blood pressure causes heart disease and increases the risk of stroke. This is more likely to develop in people who have high blood pressure readings or are overweight. Have your blood pressure checked: Every 3-5 years if you are 8-61 years of age. Every year if you are 47 years old or older. Diabetes Have regular diabetes screenings. This checks your fasting blood sugar level. Have the screening done: Once every three years after age 54 if you are at a normal weight and have a low risk for diabetes. More often and at a younger age if you are overweight or have a high risk for diabetes. What should I know about preventing infection? Hepatitis B If you have a higher risk for hepatitis B, you should be screened for this virus. Talk with your health care provider to find out if you are at risk for hepatitis B infection. Hepatitis C Testing is recommended for: Everyone born from [birth year] through 1963. Anyone with known risk factors for hepatitis C. Sexually transmitted infections (STIs) Get screened for STIs, including gonorrhea and chlamydia, if: You are sexually active and are younger than 74 years of age. You are older than 74 years of age and your health care provider tells you that you are at risk for this type of infection. Your sexual activity has changed since you were last screened, and you are at increased risk for chlamydia or gonorrhea. Ask your health care provider if you  are at risk. Ask your health care provider about whether you are at high risk for HIV. Your health care provider may recommend a prescription medicine to help prevent HIV infection. If you choose to take medicine to prevent HIV, you should first get tested for HIV. You should then be tested every 3 months for as long as you are taking the medicine. Pregnancy If you are about to stop  having your period (premenopausal) and you may become pregnant, seek counseling before you get pregnant. Take 400 to 800 micrograms (mcg) of folic acid every day if you become pregnant. Ask for birth control (contraception) if you want to prevent pregnancy. Osteoporosis and menopause Osteoporosis is a disease in which the bones lose minerals and strength with aging. This can result in bone fractures. If you are 64 years old or older, or if you are at risk for osteoporosis and fractures, ask your health care provider if you should: Be screened for bone loss. Take a calcium or vitamin D supplement to lower your risk of fractures. Be given hormone replacement therapy (HRT) to treat symptoms of menopause. Follow these instructions at home: Alcohol use Do not drink alcohol if: Your health care provider tells you not to drink. You are pregnant, may be pregnant, or are planning to become pregnant. If you drink alcohol: Limit how much you have to: 0-1 drink a day. Know how much alcohol is in your drink. In the [place] one drink equals one 12 oz bottle of beer (355 mL), one 5 oz glass of wine (148 mL), or one 1 oz glass of hard liquor (44 mL). Lifestyle Do not use any products that contain nicotine or tobacco. These products include cigarettes, chewing tobacco, and vaping devices, such as e-cigarettes. If you need help quitting, ask your health care provider. Do not use street drugs. Do not share needles. Ask your health care provider for help if you need support or information about quitting drugs. General instructions Schedule regular health, dental, and eye exams. Stay current with your vaccines. Tell your health care provider if: You often feel depressed. You have ever been abused or do not feel safe at home. Summary Adopting a healthy lifestyle and getting preventive care are important in promoting health and wellness. Follow your health care provider's instructions about healthy diet,  exercising, and getting tested or screened for diseases. Follow your health care provider's instructions on monitoring your cholesterol and blood pressure. This information is not intended to replace advice given to you by your health care provider. Make sure you discuss any questions you have with your health care provider. Document Revised: 05/16/2020 Document Reviewed: 05/16/2020 Elsevier Patient Education  [place].

## 2021-01-31 ENCOUNTER — Ambulatory Visit (INDEPENDENT_AMBULATORY_CARE_PROVIDER_SITE_OTHER): Payer: Medicare PPO | Admitting: Internal Medicine

## 2021-01-31 ENCOUNTER — Other Ambulatory Visit: Payer: Self-pay

## 2021-01-31 VITALS — BP 120/70 | HR 63 | Temp 98.0°F | Ht 67.0 in | Wt 201.0 lb

## 2021-01-31 DIAGNOSIS — I77810 Thoracic aortic ectasia: Secondary | ICD-10-CM

## 2021-01-31 DIAGNOSIS — E7849 Other hyperlipidemia: Secondary | ICD-10-CM | POA: Diagnosis not present

## 2021-01-31 DIAGNOSIS — R7303 Prediabetes: Secondary | ICD-10-CM

## 2021-01-31 DIAGNOSIS — Z0000 Encounter for general adult medical examination without abnormal findings: Secondary | ICD-10-CM | POA: Diagnosis not present

## 2021-01-31 DIAGNOSIS — G4709 Other insomnia: Secondary | ICD-10-CM

## 2021-01-31 DIAGNOSIS — I10 Essential (primary) hypertension: Secondary | ICD-10-CM | POA: Diagnosis not present

## 2021-01-31 LAB — CBC WITH DIFFERENTIAL/PLATELET
Basophils Absolute: 0 10*3/uL (ref 0.0–0.1)
Basophils Relative: 0.3 % (ref 0.0–3.0)
Eosinophils Absolute: 0.1 10*3/uL (ref 0.0–0.7)
Eosinophils Relative: 1.1 % (ref 0.0–5.0)
HCT: 39.8 % (ref 36.0–46.0)
Hemoglobin: 13.3 g/dL (ref 12.0–15.0)
Lymphocytes Relative: 37.5 % (ref 12.0–46.0)
Lymphs Abs: 1.9 10*3/uL (ref 0.7–4.0)
MCHC: 33.5 g/dL (ref 30.0–36.0)
MCV: 86.5 fl (ref 78.0–100.0)
Monocytes Absolute: 0.5 10*3/uL (ref 0.1–1.0)
Monocytes Relative: 8.9 % (ref 3.0–12.0)
Neutro Abs: 2.7 10*3/uL (ref 1.4–7.7)
Neutrophils Relative %: 52.2 % (ref 43.0–77.0)
Platelets: 203 10*3/uL (ref 150.0–400.0)
RBC: 4.6 Mil/uL (ref 3.87–5.11)
RDW: 13.4 % (ref 11.5–15.5)
WBC: 5.1 10*3/uL (ref 4.0–10.5)

## 2021-01-31 LAB — COMPREHENSIVE METABOLIC PANEL
ALT: 8 U/L (ref 0–35)
AST: 14 U/L (ref 0–37)
Albumin: 4.4 g/dL (ref 3.5–5.2)
Alkaline Phosphatase: 59 U/L (ref 39–117)
BUN: 13 mg/dL (ref 6–23)
CO2: 30 mEq/L (ref 19–32)
Calcium: 9.6 mg/dL (ref 8.4–10.5)
Chloride: 104 mEq/L (ref 96–112)
Creatinine, Ser: 1.01 mg/dL (ref 0.40–1.20)
GFR: 55.02 mL/min — ABNORMAL LOW (ref >60.00)
Glucose, Bld: 105 mg/dL — ABNORMAL HIGH (ref 70–99)
Potassium: 3.9 mEq/L (ref 3.5–5.1)
Sodium: 141 mEq/L (ref 135–145)
Total Bilirubin: 1 mg/dL (ref 0.2–1.2)
Total Protein: 6.6 g/dL (ref 6.0–8.3)

## 2021-01-31 LAB — LIPID PANEL
Cholesterol: 189 mg/dL (ref 0–200)
HDL: 67.5 mg/dL (ref >39.00)
LDL Cholesterol: 101 mg/dL — ABNORMAL HIGH (ref 0–99)
NonHDL: 121.72
Total CHOL/HDL Ratio: 3
Triglycerides: 102 mg/dL (ref 0.0–149.0)
VLDL: 20.4 mg/dL (ref 0.0–40.0)

## 2021-01-31 LAB — HEMOGLOBIN A1C: Hgb A1c MFr Bld: 5.6 % (ref 4.6–6.5)

## 2021-01-31 NOTE — Assessment & Plan Note (Signed)
Chronic Intermittent Continue clonazepam 0.25 mg - 0.5 mg bedtime as needed-does not take on a nightly basis and encouraged her to continue only as needed

## 2021-01-31 NOTE — Assessment & Plan Note (Signed)
Chronic Regular exercise and healthy diet encouraged Check lipid panel  Continue simvastatin 40 mg daily 

## 2021-01-31 NOTE — Assessment & Plan Note (Signed)
Chronic Blood pressure well controlled CMP Continue HCTZ 12.5 mg daily, labetalol 150 mg twice daily

## 2021-01-31 NOTE — Assessment & Plan Note (Signed)
Chronic Check a1c Low sugar / carb diet Stressed regular exercise  

## 2021-01-31 NOTE — Assessment & Plan Note (Signed)
Chronic Mild Monitored by cardiology

## 2021-02-06 ENCOUNTER — Other Ambulatory Visit: Payer: Self-pay | Admitting: Internal Medicine

## 2021-02-07 ENCOUNTER — Other Ambulatory Visit: Payer: Self-pay | Admitting: Internal Medicine

## 2021-02-07 ENCOUNTER — Other Ambulatory Visit: Payer: Self-pay

## 2021-02-07 ENCOUNTER — Encounter: Payer: Self-pay | Admitting: Internal Medicine

## 2021-02-07 DIAGNOSIS — R0602 Shortness of breath: Secondary | ICD-10-CM

## 2021-02-07 MED ORDER — HYDROCHLOROTHIAZIDE 12.5 MG PO CAPS: ORAL_CAPSULE | ORAL | 0 refills | Status: DC

## 2021-02-07 NOTE — Telephone Encounter (Signed)
Please advise on PA

## 2021-02-08 ENCOUNTER — Ambulatory Visit
Admission: RE | Admit: 2021-02-08 | Discharge: 2021-02-08 | Disposition: A | Discharge status: Home / Self Care | Payer: Medicare PPO | Source: Ambulatory Visit | Attending: Internal Medicine | Admitting: Internal Medicine

## 2021-02-08 ENCOUNTER — Other Ambulatory Visit (HOSPITAL_COMMUNITY): Payer: Self-pay

## 2021-02-08 DIAGNOSIS — R928 Other abnormal and inconclusive findings on diagnostic imaging of breast: Secondary | ICD-10-CM

## 2021-02-08 DIAGNOSIS — N6001 Solitary cyst of right breast: Secondary | ICD-10-CM | POA: Diagnosis not present

## 2021-02-10 ENCOUNTER — Telehealth: Payer: Self-pay

## 2021-02-10 ENCOUNTER — Other Ambulatory Visit (HOSPITAL_COMMUNITY): Payer: Self-pay

## 2021-02-10 ENCOUNTER — Telehealth: Payer: Self-pay | Admitting: Internal Medicine

## 2021-02-10 MED ORDER — LEVALBUTEROL TARTRATE 45 MCG/ACT IN AERO: 2.0000 | INHALATION_SPRAY | Freq: Four times a day (QID) | RESPIRATORY_TRACT | 5 refills | Status: DC | PRN

## 2021-02-10 NOTE — Telephone Encounter (Signed)
Patient Advocate Encounter   Received notification from [facility] that prior authorization for Ventolin HFA is required by his/her insurance [employer].   PA submitted on 02/10/21  Key#:  [id number]  Status is pending    [facility] will continue to follow:  Patient Advocate Fax: [phone]

## 2021-02-10 NOTE — Telephone Encounter (Signed)
Ok to change to levalbuterol. Sent to pharmacy

## 2021-02-10 NOTE — Telephone Encounter (Signed)
Lm for patient.  

## 2021-02-10 NOTE — Telephone Encounter (Signed)
The preferred inhaler is levabuterol. Please advise on the change of inhalers.

## 2021-02-10 NOTE — Telephone Encounter (Signed)
Spoke with pt who stated she believed phone call was in reference to albuterol. Pt was informed that albuterol did require a PA which has already been submitted. Notified pt we would update her when update was given. Pt stated understanding. Nothing further needed at this time.

## 2021-02-14 ENCOUNTER — Other Ambulatory Visit (HOSPITAL_COMMUNITY): Payer: Self-pay

## 2021-02-20 ENCOUNTER — Other Ambulatory Visit: Payer: Self-pay | Admitting: Internal Medicine

## 2021-02-21 ENCOUNTER — Ambulatory Visit: Payer: Medicare PPO

## 2021-02-27 ENCOUNTER — Other Ambulatory Visit (HOSPITAL_COMMUNITY): Payer: Self-pay

## 2021-02-27 NOTE — Telephone Encounter (Signed)
Patient Advocate Encounter  Received notification from [facility] that the request for prior authorization for Albuterol Sulfate has been denied due to the patient not trying albuterol sulfate CFC-free aerosol (except NDC: [id number]) & levalbuterol tartrate CFC-free aerosol.     Specialty Pharmacy Patient Advocate Fax: [phone]

## 2021-03-03 ENCOUNTER — Other Ambulatory Visit: Payer: Self-pay | Admitting: Internal Medicine

## 2021-04-10 DIAGNOSIS — Z6832 Body mass index (BMI) 32.0-32.9, adult: Secondary | ICD-10-CM | POA: Diagnosis not present

## 2021-04-10 DIAGNOSIS — Z01419 Encounter for gynecological examination (general) (routine) without abnormal findings: Secondary | ICD-10-CM | POA: Diagnosis not present

## 2021-04-24 ENCOUNTER — Encounter: Payer: Self-pay | Admitting: Internal Medicine

## 2021-04-24 NOTE — Progress Notes (Signed)
? ? ?Subjective:  ? ? Patient ID: Sydney Robinson, female    DOB: January 21, 1945, 76 y.o.   MRN: [id number] ? ?This visit occurred during the SARS-CoV-2 public health emergency.  Safety protocols were in place, including screening questions prior to the visit, additional usage of staff PPE, and extensive cleaning of exam room while observing appropriate contact time as indicated for disinfecting solutions. ? ? ? ?HPI ?Sydney Robinson is here for  ?Chief Complaint  ?Patient presents with  ? Referral  ?  For an colonoscopy  ? ? ? ?To discuss referral for colonoscopy.  Her last colonoscopy was 06/07/2016 she does have a personal history of adenomatous colon polyps.  Was recommended that she return after 5 years, which is next month.  Dr. Earlean Robinson was the one who did a colonoscopy.  He retires after next month.  ? ?GI advised her she needed a referral.  She did see some blood on the toilet tissue a couple of months ago - she has not seen it since then. She has a fam hx of colon cancer.  ? ?She is taking all of her medications as prescribed.   ? ? ?Medications and allergies reviewed with patient and updated if appropriate. ? ?Current Outpatient Medications on File Prior to Visit  ?Medication Sig Dispense Refill  ? albuterol (VENTOLIN HFA) 108 (90 Base) MCG/ACT inhaler TAKE 2 PUFFS BY MOUTH EVERY 6 HOURS AS NEEDED FOR WHEEZE OR SHORTNESS OF BREATH 18 each 5  ? Cholecalciferol (VITAMIN D3 PO) Take 1 tablet by mouth as needed.     ? fexofenadine (ALLEGRA) 60 MG tablet Take 60 mg by mouth 2 (two) times daily.    ? hydrochlorothiazide (MICROZIDE) 12.5 MG capsule TAKE 1 CAPSULE BY MOUTH EVERY DAY 30 capsule 3  ? labetalol (NORMODYNE) 300 MG tablet TAKE HALF A TABLETS (150 MG TOTAL) BY MOUTH 2 (TWO) TIMES DAILY. 90 tablet 7  ? levalbuterol (XOPENEX HFA) 45 MCG/ACT inhaler Inhale 2 puffs into the lungs every 6 (six) hours as needed for wheezing or shortness of breath. 1 each 5  ? polycarbophil (FIBERCON) 625 MG tablet Take 625 mg by mouth  daily.    ? simvastatin (ZOCOR) 40 MG tablet TAKE 1 TABLET BY MOUTH EVERYDAY AT BEDTIME 90 tablet 3  ? ?No current facility-administered medications on file prior to visit.  ? ? ?Review of Systems  ?Constitutional:  Negative for fever.  ?Respiratory:  Positive for cough (from pollen). Negative for shortness of breath and wheezing.   ?Cardiovascular:  Negative for chest pain, palpitations and leg swelling.  ?Gastrointestinal:  Positive for anal bleeding (once episode of blood on TP). Negative for abdominal pain, blood in stool (no black stool), constipation, diarrhea and nausea.  ?Neurological:  Positive for headaches. Negative for light-headedness.  ? ?   ?Objective:  ? ?Vitals:  ? 04/25/21 0826  ?BP: 122/70  ?Pulse: 68  ?Temp: 97.6 ?F (36.4 ?C)  ?SpO2: 99%  ? ?BP Readings from Last 3 Encounters:  ?04/25/21 122/70  ?01/31/21 120/70  ?10/25/20 118/88  ? ?Wt Readings from Last 3 Encounters:  ?04/25/21 203 lb (92.1 kg)  ?01/31/21 201 lb (91.2 kg)  ?10/25/20 200 lb 3.2 oz (90.8 kg)  ? ?Body mass index is 31.79 kg/m?. ? ?  ?Physical Exam ?Constitutional:   ?   General: She is not in acute distress. ?   Appearance: Normal appearance.  ?HENT:  ?   Head: Normocephalic and atraumatic.  ?Eyes:  ?   Conjunctiva/sclera: Conjunctivae normal.  ?  Cardiovascular:  ?   Rate and Rhythm: Normal rate and regular rhythm.  ?   Heart sounds: Normal heart sounds. No murmur heard. ?Pulmonary:  ?   Effort: Pulmonary effort is normal. No respiratory distress.  ?   Breath sounds: Normal breath sounds. No wheezing.  ?Musculoskeletal:  ?   Cervical back: Neck supple.  ?   Right lower leg: No edema.  ?   Left lower leg: No edema.  ?Lymphadenopathy:  ?   Cervical: No cervical adenopathy.  ?Skin: ?   General: Skin is warm and dry.  ?   Findings: No rash.  ?Neurological:  ?   Mental Status: She is alert. Mental status is at baseline.  ?Psychiatric:     ?   Mood and Affect: Mood normal.     ?   Behavior: Behavior normal.  ? ?   ? ? ? ? ? ?Assessment &  Plan:  ? ? ?See Problem List for Assessment and Plan of chronic medical problems.  ? ? ?FU 1/ 2024 for CPE ? ?

## 2021-04-25 ENCOUNTER — Ambulatory Visit (INDEPENDENT_AMBULATORY_CARE_PROVIDER_SITE_OTHER): Payer: Medicare PPO | Admitting: Internal Medicine

## 2021-04-25 VITALS — BP 122/70 | HR 68 | Temp 97.6°F | Ht 67.0 in | Wt 203.0 lb

## 2021-04-25 DIAGNOSIS — Z9989 Dependence on other enabling machines and devices: Secondary | ICD-10-CM

## 2021-04-25 DIAGNOSIS — Z86010 Personal history of colonic polyps: Secondary | ICD-10-CM | POA: Diagnosis not present

## 2021-04-25 DIAGNOSIS — G4733 Obstructive sleep apnea (adult) (pediatric): Secondary | ICD-10-CM | POA: Diagnosis not present

## 2021-04-25 DIAGNOSIS — E7849 Other hyperlipidemia: Secondary | ICD-10-CM

## 2021-04-25 DIAGNOSIS — I10 Essential (primary) hypertension: Secondary | ICD-10-CM | POA: Diagnosis not present

## 2021-04-25 DIAGNOSIS — G4709 Other insomnia: Secondary | ICD-10-CM | POA: Diagnosis not present

## 2021-04-25 MED ORDER — CLONAZEPAM 0.5 MG PO TABS: ORAL_TABLET | ORAL | 0 refills | Status: DC

## 2021-04-25 NOTE — Assessment & Plan Note (Signed)
Last colonoscopy 06/07/2016 - no polyps - repeat advised after 5 years - she is due next month ?Dr Earlean Shawl is retiring - would like to go to [place] ?One episode 2 months ago of blood on TP  None since then. H/o hemorrhoids ?Referral to [facility] for colonoscopy ?

## 2021-04-25 NOTE — Patient Instructions (Addendum)
?  [facility] -- Phone: [phone]  ? ? ?Medications changes include :   none ? ? ?Your prescription(s) have been sent to your pharmacy.  ? ? ?A referral was ordered for [facility] for colonoscopy.     Someone from that office will call you to schedule an appointment.  ? ? ?Return for cancel July appt - next appt - CPE jan '24. ? ?

## 2021-04-25 NOTE — Assessment & Plan Note (Signed)
Chronic ?Intermittent ?Improved with reading before going to bed ( not on phone or watching tv) ?Continue clonazepam 0.25-0.5 mg HS prn - does not take often  ?Refilled today ? ?

## 2021-04-25 NOTE — Assessment & Plan Note (Signed)
Chronic ?BP well controlled ?Continue hctz 12.5 mg daily, labetalol 150 mg bid ?

## 2021-04-25 NOTE — Assessment & Plan Note (Signed)
Chronic ?Continue simvastatin 40 mg daily ?Regular exercise and healthy diet encouraged ? ?

## 2021-05-04 ENCOUNTER — Encounter: Payer: Self-pay | Admitting: Internal Medicine

## 2021-05-04 ENCOUNTER — Ambulatory Visit (INDEPENDENT_AMBULATORY_CARE_PROVIDER_SITE_OTHER): Payer: Medicare PPO | Admitting: Internal Medicine

## 2021-05-04 VITALS — BP 138/78 | HR 68 | Temp 97.7°F | Ht 66.0 in | Wt 205.0 lb

## 2021-05-04 DIAGNOSIS — G4733 Obstructive sleep apnea (adult) (pediatric): Secondary | ICD-10-CM

## 2021-05-04 DIAGNOSIS — Z9989 Dependence on other enabling machines and devices: Secondary | ICD-10-CM | POA: Diagnosis not present

## 2021-05-04 NOTE — Patient Instructions (Addendum)
Please schedule follow up scheduled with myself in 1 year.  If my schedule is not open yet, we will contact you with a reminder closer to that time. Please call [phone] if you haven't heard from [place] a month before.  ? ? ?Call me sooner if any concerns with your breathing.  ?

## 2021-05-04 NOTE — Progress Notes (Signed)
? ?      ?Sydney Robinson    [id number]    August 29, 1945 ? ?Primary Care Physician:Burns, Claudina Lick, MD ?Date of Appointment: 05/04/2021 ?Established Patient Visit ? ?Chief complaint:   ?Chief Complaint  ?Patient presents with  ? Follow-up  ?  No c/o, lightheaded with standing  ? ? ? ?HPI: ?Sydney Robinson is a 76 y.o. with shortness of breath and OSA who presents for follow up.   ? ?Interval Updates: ?Here for follow up for OSA.  ?CPAP download reviewed - 93% usage with mean AHI 1.9 with median pressures 8.7 mm hg.  ? ?Having some dizziness which she thinks is related to BP meds.  ? ?She feels short of breath occasionally with exertion and uses albuterol inhaler. Using it about twice a week.  ? ?She does not some concern for occupational asbestos exposure many years ago  ? ?I have reviewed the patient's family social and past medical history and updated as appropriate.  ? ?Past Medical History:  ?Diagnosis Date  ? Colon polyps 1999  ? adenomatous  ? Diverticulosis   ? Hyperlipidemia   ? Hypertension   ? ? ?Past Surgical History:  ?Procedure Laterality Date  ? BREAST EXCISIONAL BIOPSY Bilateral 1970s  ? CATARACT EXTRACTION Right   ? COLONOSCOPY W/ POLYPECTOMY  1998  ?  negative 2003 & 2008; 2013  ? DILATION AND CURETTAGE OF UTERUS    ? x 3  ? fibroidectomy    ? x3  ? g2 p2    ? hemorrhoidal banding  2011  ? Dr Earlean Shawl  ? Neuroma Resected Foot    ? Right   ? OVARIAN CYST REMOVAL    ? ROTATOR CUFF REPAIR    ? TONSILLECTOMY    ? ? ?Family History  ?Problem Relation Age of Onset  ? Diabetes Mother   ? Alzheimer's disease Mother   ? Heart failure Mother   ? Glaucoma Mother   ? Arthritis Mother   ? Heart attack Maternal Aunt 59  ? Stroke Paternal Aunt 41  ?     cns aneurysm  ? Uterine cancer Paternal Aunt   ? Breast cancer Sister   ?     2 sisters   ? Heart disease Sister   ? Diverticulitis Sister   ? Breast cancer Sister 25  ? Glaucoma Sister   ? Diabetes Maternal Grandmother   ? Diabetes Other   ?     MGGM  ? Colon  cancer Other   ?     cousin  ? Diabetes Maternal Aunt   ? Diabetes Maternal Uncle   ? Heart attack Maternal Uncle   ? Diabetes Paternal Aunt   ? Stroke Maternal Grandfather   ? ? ?Social History  ? ?Occupational History  ? Not on file  ?Tobacco Use  ? Smoking status: Never  ? Smokeless tobacco: Never  ?Substance and Sexual Activity  ? Alcohol use: No  ?  Alcohol/week: 0.0 standard drinks  ?  Comment: Wine rarely  ? Drug use: No  ? Sexual activity: Not on file  ? ? ? ?Physical Exam: ?Blood pressure 138/78, pulse 68, temperature 97.7 ?F (36.5 ?C), temperature source Oral, height '5\' 6"'$  (1.676 m), weight 205 lb (93 kg), SpO2 100 %. ? ?Gen:      No acute distress ?Lungs:   ctab no wheezes or crackles ?CV:        RRR no mrg ? ? ?Data Reviewed: ?Imaging: ?I have personally reviewed  the chest xray April 2021. No acute process. Relatively unchanged from chest xray 2013 ? ?PFTs: ? ? ? ?  Latest Ref Rng & Units 08/21/2019  ? 12:50 PM  ?PFT Results  ?FVC-Pre L 2.46    ?FVC-Predicted Pre % 92    ?FVC-Post L 2.53    ?FVC-Predicted Post % 95    ?Pre FEV1/FVC % % 81    ?Post FEV1/FCV % % 85    ?FEV1-Pre L 1.99    ?FEV1-Predicted Pre % 96    ?FEV1-Post L 2.14    ?DLCO uncorrected ml/min/mmHg 18.24    ?DLCO UNC% % 85    ?DLCO corrected ml/min/mmHg 18.24    ?DLCO COR %Predicted % 85    ?DLVA Predicted % 111    ?TLC L 4.22    ?TLC % Predicted % 76    ?RV % Predicted % 77    ? ?I have personally reviewed the patient's PFTs and there is no airflow limitation and mild restriction to ventilation likely secondary to body habitus. ? ?Echocardiogram 05/20/2019 ? 1. Left ventricular ejection fraction, by estimation, is 60 to 65%. The  ?left ventricle has normal function. The left ventricle has no regional  ?wall motion abnormalities. Left ventricular diastolic parameters are  ?consistent with Grade I diastolic  ?dysfunction (impaired relaxation).  ? 2. Right ventricular systolic function is normal. The right ventricular  ?size is normal.  Tricuspid regurgitation signal is inadequate for assessing  ?PA pressure.  ? 3. The mitral valve is abnormal. Trivial mitral valve regurgitation.  ? 4. The aortic valve is tricuspid. Aortic valve regurgitation is not  ?visualized. Mild aortic valve sclerosis is present, with no evidence of  ?aortic valve stenosis.  ? 5. Aortic dilatation noted. 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  ? 6. The inferior vena cava is normal in size with greater than 50%  ?respiratory variability, suggesting right atrial pressure of 3 mmHg. ? ?Labs: ?Lab Results  ?Component Value Date  ? WBC 5.1 01/31/2021  ? HGB 13.3 01/31/2021  ? HCT 39.8 01/31/2021  ? MCV 86.5 01/31/2021  ? PLT 203.0 01/31/2021  ? ?Lab Results  ?Component Value Date  ? NA 141 01/31/2021  ? K 3.9 01/31/2021  ? CL 104 01/31/2021  ? CO2 30 01/31/2021  ? ? ?Immunization status: ?Immunization History  ?Administered Date(s) Administered  ? Fluad Quad(high Dose 65+) 08/27/2020  ? Influenza Split 11/15/2010  ? Influenza Whole 01/08/2001, 10/08/2007  ? Influenza, High Dose Seasonal PF 11/01/2014, 10/08/2017, 08/26/2018  ? Influenza, Seasonal, Injecte, Preservative Fre 12/03/2011  ? Influenza,inj,Quad PF,6+ Mos 10/13/2013  ? Influenza-Unspecified 10/14/2012, 11/01/2014, 10/05/2015, 10/08/2016, 08/20/2018, 08/26/2019  ? PFIZER(Purple Top)SARS-COV-2 Vaccination 02/24/2019, 03/06/2019, 10/05/2019  ? Pneumococcal Conjugate-13 12/14/2014  ? Pneumococcal Polysaccharide-23 12/23/2015  ? Td 07/29/2008  ? ? ?Assessment:  ?Shortness of Breath with mild restriction to ventilation ?OSA on CPAP ?Allergic Rhinitis ? ? ?Plan/Recommendations: ?Cpap download reviewed. Minimal leak. Excellent adherence. Continue current settings.  ? ?Return to Care: ?Return in about 1 year (around 05/05/2022). ? ? ?Lenice Llamas, MD ?Pulmonary and Critical Care Medicine ?[place] ?[phone] ? ? ? ? ? ?

## 2021-05-16 IMAGING — MG DIGITAL SCREENING BILAT W/ TOMO W/ CAD
8 series · 8 of 24 positions shown · non-contrast
Comparison: Previous exam(s).

CLINICAL DATA: Screening.

EXAM:
DIGITAL SCREENING BILATERAL MAMMOGRAM WITH TOMO AND CAD

[L CC synth-2D]
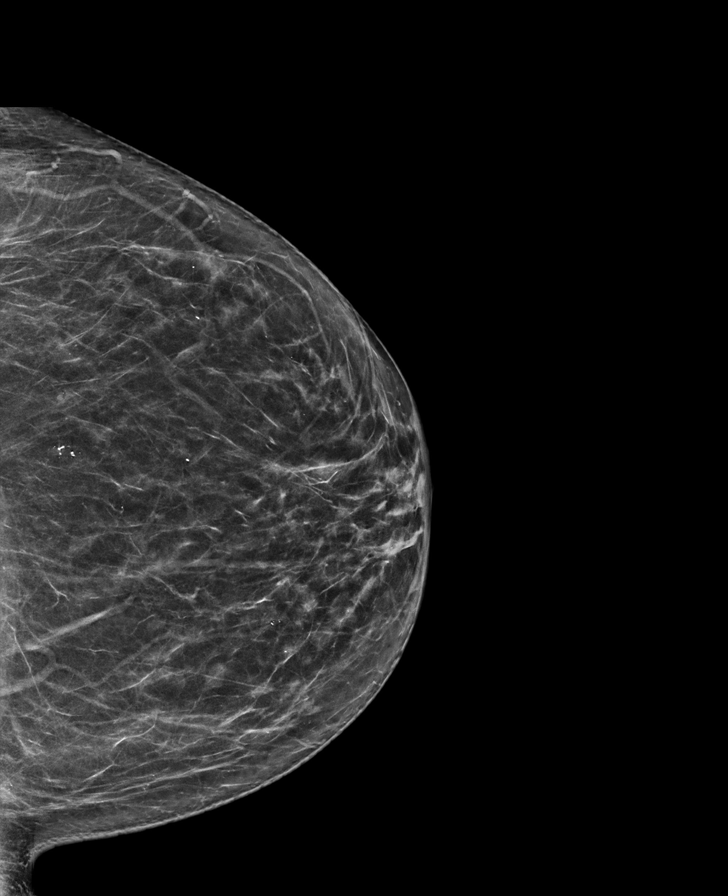

[R CC synth-2D]
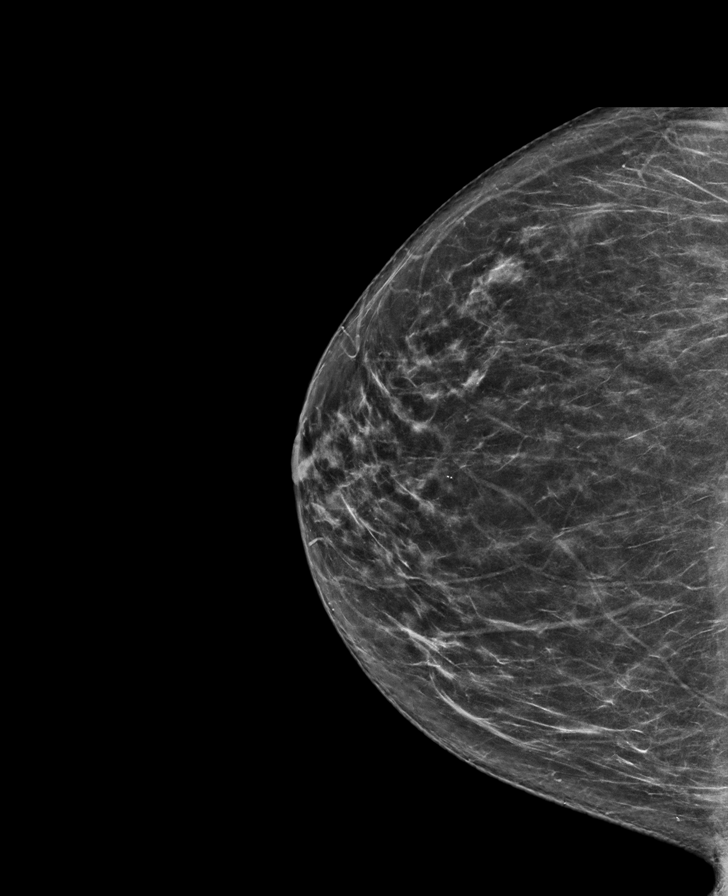

[R MLO synth-2D]
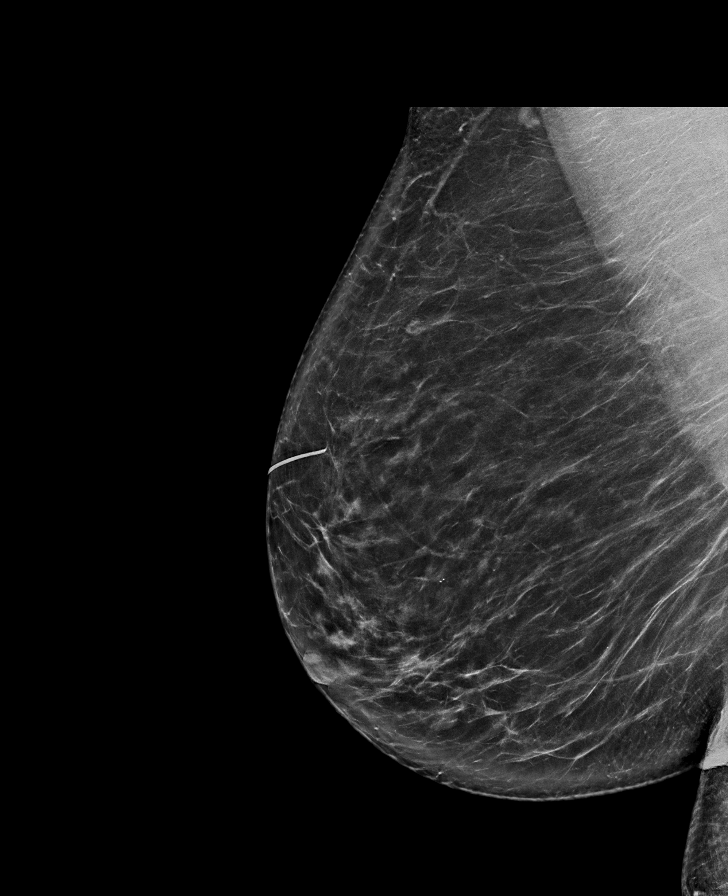

[L MLO synth-2D]
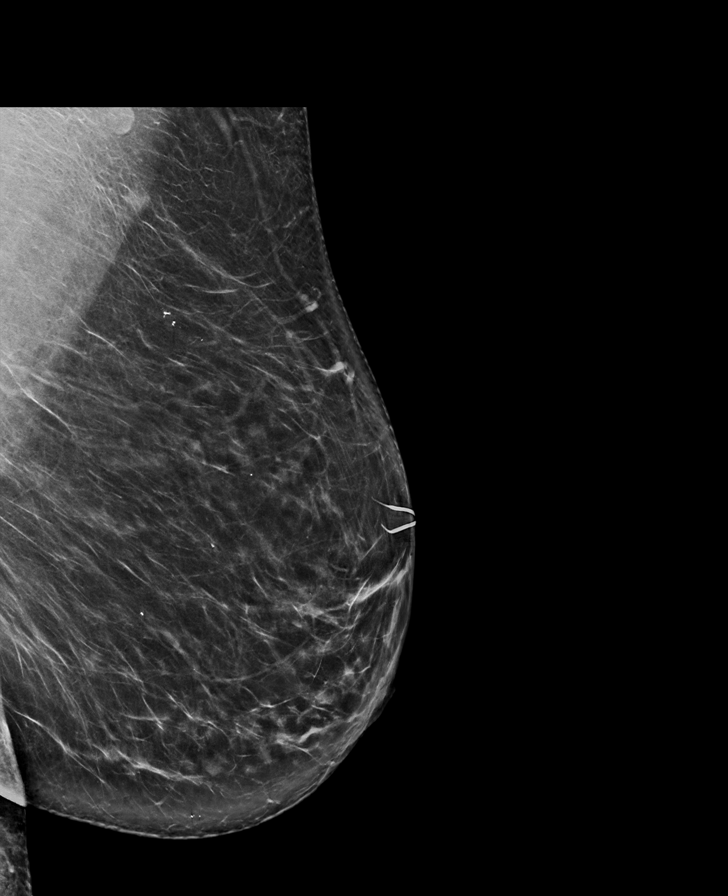

[L CC tomo · tomo slice 35/68.0]
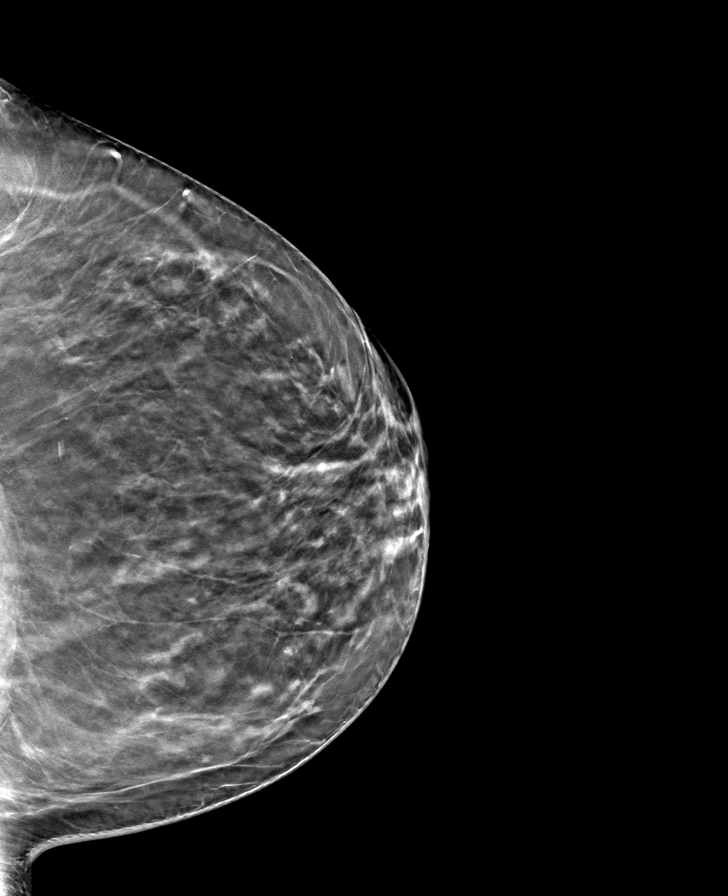

[L MLO tomo · tomo slice 42/83.0]
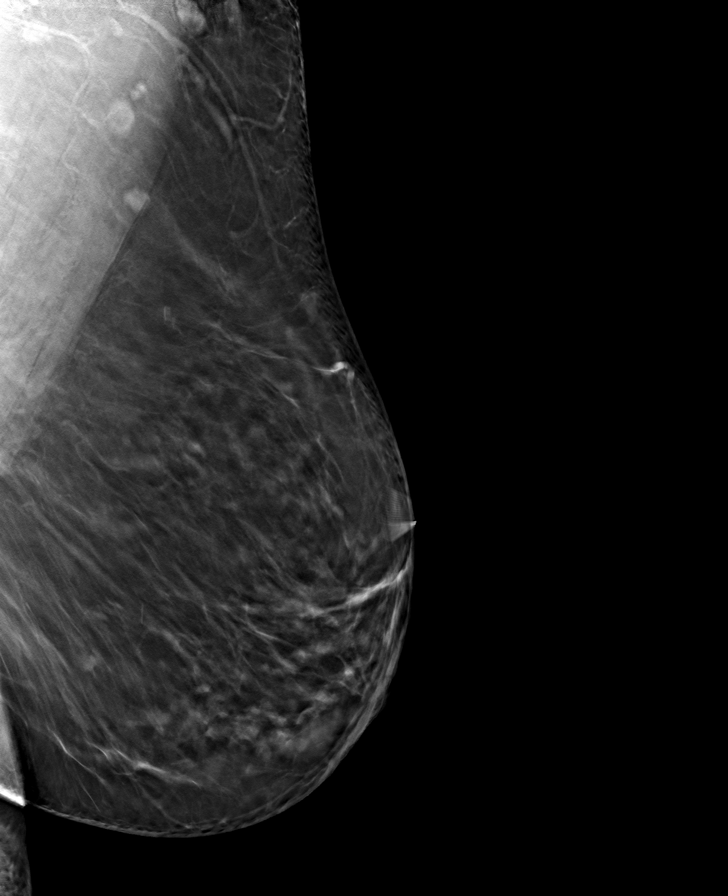

[R MLO tomo · tomo slice 43/86.0]
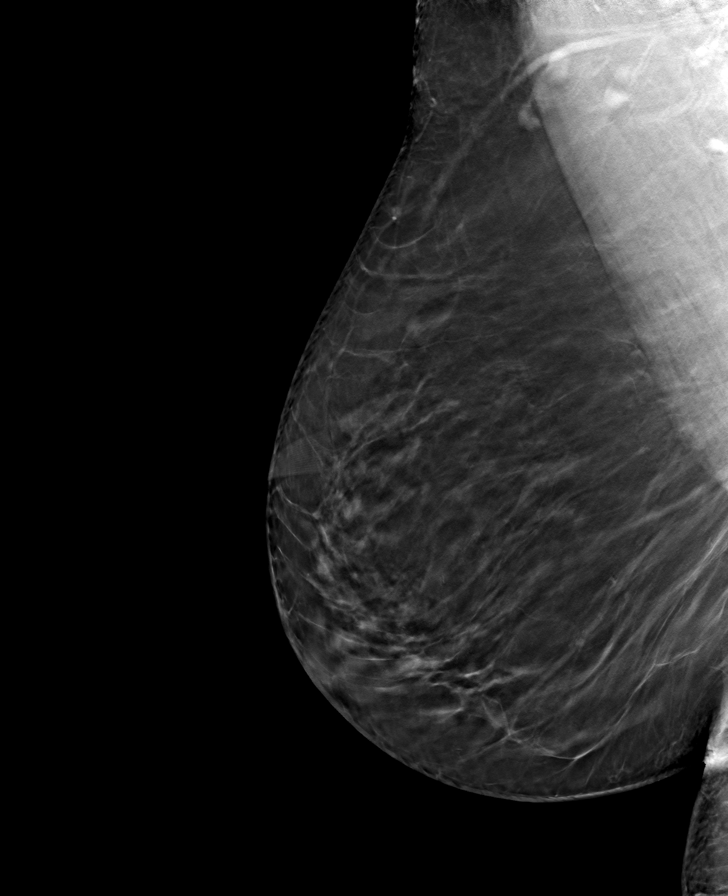

[R CC tomo · tomo slice 37/73.0]
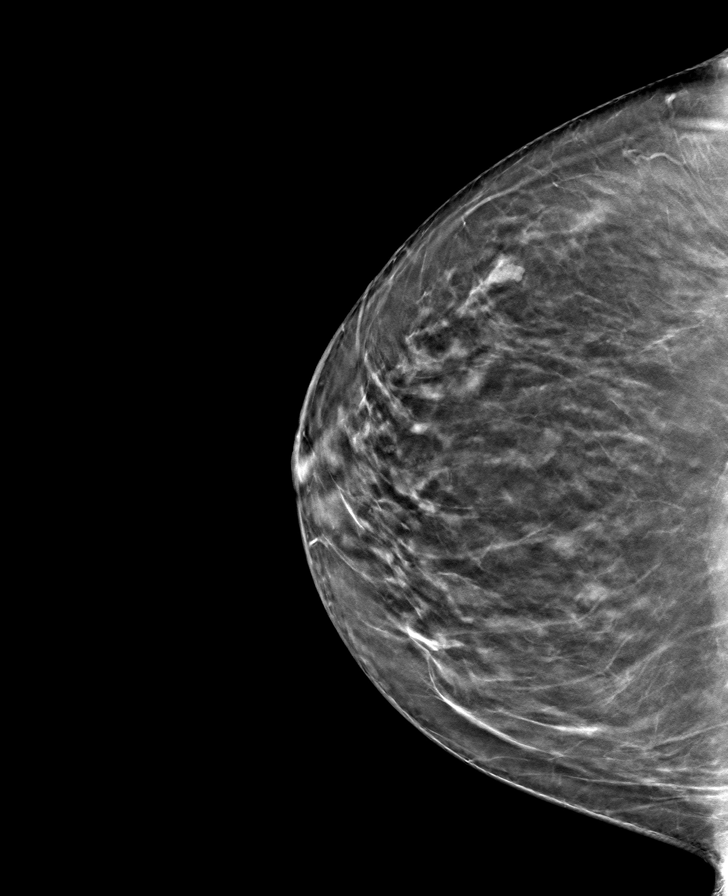

[8 of 24 positions shown; findings below may reference images not displayed]

ACR Breast Density Category b: There are scattered areas of
fibroglandular density.
FINDINGS: There are no findings suspicious for malignancy. Images were
processed with CAD.
IMPRESSION: No mammographic evidence of malignancy. A result letter of this
screening mammogram will be mailed directly to the patient.

RECOMMENDATION:
Screening mammogram in one year. (Code:[id number])

BI-RADS CATEGORY  1: Negative.

## 2021-06-01 ENCOUNTER — Encounter: Payer: Self-pay | Admitting: Internal Medicine

## 2021-06-01 ENCOUNTER — Telehealth: Payer: Self-pay | Admitting: Internal Medicine

## 2021-06-01 NOTE — Telephone Encounter (Signed)
Hi Dr. Lorenso Courier,  This patient is requesting a transfer of care here.  She says she is due for a colonoscopy.  She has been a patient of Dr. Earlean Shawl, who is no longer seeing patients after June 1, and she wishes to get back in the [facility] system.  Her records are in Epic for your review.  Please let me know if you approve her transfer.  Thank you.

## 2021-06-27 ENCOUNTER — Other Ambulatory Visit: Payer: Medicare PPO

## 2021-06-27 ENCOUNTER — Ambulatory Visit (AMBULATORY_SURGERY_CENTER): Payer: Self-pay | Admitting: *Deleted

## 2021-06-27 VITALS — Ht 66.0 in | Wt 203.0 lb

## 2021-06-27 DIAGNOSIS — Z86010 Personal history of colonic polyps: Secondary | ICD-10-CM

## 2021-06-27 NOTE — Progress Notes (Signed)
No egg or soy allergy known to patient  No issues known to pt with past sedation with any surgeries or procedures Patient denies ever being told they had issues or difficulty with intubation  No FH of Malignant Hyperthermia Pt is not on diet pills Pt is not on  home 02  Pt is not on blood thinners  Pt denies issues with constipation  No A fib or A flutter  PV completed in person. Pt verified name, DOB.  Procedure explained to pt. Prep instructions reviewed, questions answered. Pt encouraged to call with questions or issues.  If pt has My chart, procedure instructions sent via My Chart    

## 2021-07-07 DIAGNOSIS — H35371 Puckering of macula, right eye: Secondary | ICD-10-CM | POA: Diagnosis not present

## 2021-07-07 DIAGNOSIS — H524 Presbyopia: Secondary | ICD-10-CM | POA: Diagnosis not present

## 2021-07-07 DIAGNOSIS — H25012 Cortical age-related cataract, left eye: Secondary | ICD-10-CM | POA: Diagnosis not present

## 2021-07-09 NOTE — Progress Notes (Signed)
Cardiology Office Note   Date:  07/10/2021   ID:  Robinson, Sydney [birth year], MRN [id number]  PCP:  [name], MD  Cardiologist:   Minus Breeding, MD   Chief Complaint  Patient presents with   Dizziness      History of Present Illness: Sydney Robinson is a 76 y.o. female who presents for evaluation of SOB.  She was seen by Dr. Oval Linsey.   She had a negative Lexiscan Myoview.  Echo showed a normal EF and mild diastolic dysfunction.    She had some mild sleep apnea diagnosed.  She is resting with CPAP.  She has had pulmonary function testing without significant findings.    Since she was last seen in the last year she has not occasional mild shortness of breath but is not particular bothersome.  She has occasional lightheadedness when she stands up quickly but she is not having any further syncope.  She is not having any presyncope.  She denies any chest pressure, neck or arm discomfort.  Has had no weight gain or edema.   Past Medical History:  Diagnosis Date   Asthma    Colon polyps 1999   adenomatous   Diverticulosis    GERD (gastroesophageal reflux disease)    Heart murmur    Hyperlipidemia    Hypertension    Sleep apnea     Past Surgical History:  Procedure Laterality Date   BREAST EXCISIONAL BIOPSY Bilateral 1970s   CATARACT EXTRACTION Right    COLONOSCOPY W/ POLYPECTOMY  1998    negative 2003 & 2008; 2013   DILATION AND CURETTAGE OF UTERUS     x 3   fibroidectomy     x3   g2 p2     hemorrhoidal banding  2011   Dr Earlean Shawl   Neuroma Resected Foot     Right    OVARIAN CYST REMOVAL     ROTATOR CUFF REPAIR     TONSILLECTOMY       Current Outpatient Medications  Medication Sig Dispense Refill   albuterol (VENTOLIN HFA) 108 (90 Base) MCG/ACT inhaler TAKE 2 PUFFS BY MOUTH EVERY 6 HOURS AS NEEDED FOR WHEEZE OR SHORTNESS OF BREATH 18 each 5   Biotin 1000 MCG tablet as needed.     Cholecalciferol (VITAMIN D3 PO) Take 1 tablet by mouth as needed.       clonazePAM (KLONOPIN) 0.5 MG tablet TAKE 1/2-1 TABLET AT BEDTIME AS NEEDED 30 tablet 0   fexofenadine (ALLEGRA) 60 MG tablet Take 60 mg by mouth 2 (two) times daily.     hydrochlorothiazide (MICROZIDE) 12.5 MG capsule TAKE 1 CAPSULE BY MOUTH EVERY DAY 30 capsule 3   labetalol (NORMODYNE) 300 MG tablet TAKE HALF A TABLETS (150 MG TOTAL) BY MOUTH 2 (TWO) TIMES DAILY. 90 tablet 7   polycarbophil (FIBERCON) 625 MG tablet Take 625 mg by mouth daily.     simvastatin (ZOCOR) 40 MG tablet TAKE 1 TABLET BY MOUTH EVERYDAY AT BEDTIME 90 tablet 3   levalbuterol (XOPENEX HFA) 45 MCG/ACT inhaler Inhale 2 puffs into the lungs every 6 (six) hours as needed for wheezing or shortness of breath. (Patient not taking: Reported on 07/10/2021) 1 each 5   No current facility-administered medications for this visit.    Allergies:   Shellfish allergy    ROS:  Please see the history of present illness.   Otherwise, review of systems are positive for none.   All other systems are reviewed and  negative.    PHYSICAL EXAM: VS:  BP 119/70   Pulse 64   Ht '5\' 7"'$  (1.702 m)   Wt 204 lb 9.6 oz (92.8 kg)   SpO2 99%   BMI 32.04 kg/m  , BMI Body mass index is 32.04 kg/m. GENERAL:  Well appearing NECK:  No jugular venous distention, waveform within normal limits, carotid upstroke brisk and symmetric, no bruits, no thyromegaly LUNGS:  Clear to auscultation bilaterally CHEST:  Unremarkable HEART:  PMI not displaced or sustained,S1 and S2 within normal limits, no S3, no S4, no clicks, no rubs, no murmurs ABD:  Flat, positive bowel sounds normal in frequency in pitch, no bruits, no rebound, no guarding, no midline pulsatile mass, no hepatomegaly, no splenomegaly EXT:  2 plus pulses throughout, no edema, no cyanosis no clubbing  EKG:  EKG is  ordered today. The ekg ordered today demonstrates sinus rhythm, rate TakeCare64, axis within normal limits, RSR prime V1 and V2, no acute ST-T wave changes.   Recent Labs: 01/31/2021:  ALT 8; BUN 13; Creatinine, Ser 1.01; Hemoglobin 13.3; Platelets 203.0; Potassium 3.9; Sodium 141    Lipid Panel    Component Value Date/Time   CHOL 189 01/31/2021 1028   CHOL 264 (H) 12/07/2013 1539   TRIG 102.0 01/31/2021 1028   TRIG 176 (H) 12/07/2013 1539   HDL 67.50 01/31/2021 1028   HDL 74 12/07/2013 1539   CHOLHDL 3 01/31/2021 1028   VLDL 20.4 01/31/2021 1028   LDLCALC 101 (H) 01/31/2021 1028   LDLCALC 155 (H) 12/07/2013 1539   LDLDIRECT 130.5 12/29/2012 0850      Wt Readings from Last 3 Encounters:  07/10/21 204 lb 9.6 oz (92.8 kg)  06/27/21 203 lb (92.1 kg)  05/04/21 205 lb (93 kg)      Other studies Reviewed: Additional studies/ records that were reviewed today include: Labs. Review of the above records demonstrates:  Please see elsewhere in the note.     ASSESSMENT AND PLAN:  SOB: This is not particular problematic.  She had an extensive work-up.  No further work-up or change in therapy.   HTN: Her blood pressure is well controlled.  She can continue the meds as listed.   DYSLIPIDEMIA: LDL was mildly elevated but the HDL was excellent.  No change in therapy.  SYNCOPE:   This was presumed orthostatic and she is no longer having the symptoms.  No change in therapy or further work-up.  Current medicines are reviewed at length with the patient today.  The patient does not have concerns regarding medicines.  The following changes have been made: None  Labs/ tests ordered today include: None  Orders Placed This Encounter  Procedures   EKG 12-Lead     Disposition:   FU with me as needed   Signed, Minus Breeding, MD  07/10/2021 10:13 AM    [place]

## 2021-07-10 ENCOUNTER — Encounter: Payer: Self-pay | Admitting: Cardiology

## 2021-07-10 ENCOUNTER — Ambulatory Visit (INDEPENDENT_AMBULATORY_CARE_PROVIDER_SITE_OTHER): Payer: Medicare PPO | Admitting: Cardiology

## 2021-07-10 VITALS — BP 119/70 | HR 64 | Ht 67.0 in | Wt 204.6 lb

## 2021-07-10 DIAGNOSIS — E785 Hyperlipidemia, unspecified: Secondary | ICD-10-CM

## 2021-07-10 DIAGNOSIS — R0602 Shortness of breath: Secondary | ICD-10-CM | POA: Diagnosis not present

## 2021-07-10 DIAGNOSIS — R55 Syncope and collapse: Secondary | ICD-10-CM | POA: Diagnosis not present

## 2021-07-10 DIAGNOSIS — I10 Essential (primary) hypertension: Secondary | ICD-10-CM | POA: Diagnosis not present

## 2021-07-10 NOTE — Patient Instructions (Signed)
Medication Instructions:  No changes *If you need a refill on your cardiac medications before your next appointment, please call your pharmacy*   Lab Work: None ordered If you have labs (blood work) drawn today and your tests are completely normal, you will receive your results only by: [place] (if you have MyChart) OR A paper copy in the mail If you have any lab test that is abnormal or we need to change your treatment, we will call you to review the results.   Testing/Procedures: None ordered   Follow-Up: At [facility], you and your health needs are our priority.  As part of our continuing mission to provide you with exceptional heart care, we have created designated Provider Care Teams.  These Care Teams include your primary Cardiologist (physician) and Advanced Practice Providers (APPs -  Physician Assistants and Nurse Practitioners) who all work together to provide you with the care you need, when you need it.  We recommend signing up for the patient portal called "MyChart".  Sign up information is provided on this After Visit Summary.  MyChart is used to connect with patients for Virtual Visits (Telemedicine).  Patients are able to view lab/test results, encounter notes, upcoming appointments, etc.  Non-urgent messages can be sent to your provider as well.   To learn more about what you can do with MyChart, go to [email or web address].    Your next appointment:   Follow up as needed with Dr. Percival Spanish  Important Information About Sugar

## 2021-07-18 ENCOUNTER — Encounter: Payer: Self-pay | Admitting: Internal Medicine

## 2021-07-18 ENCOUNTER — Ambulatory Visit (AMBULATORY_SURGERY_CENTER): Payer: Medicare PPO | Admitting: Internal Medicine

## 2021-07-18 VITALS — BP 136/74 | HR 54 | Temp 96.9°F | Resp 12 | Ht 66.0 in | Wt 203.0 lb

## 2021-07-18 DIAGNOSIS — D123 Benign neoplasm of transverse colon: Secondary | ICD-10-CM

## 2021-07-18 DIAGNOSIS — Z86010 Personal history of colonic polyps: Secondary | ICD-10-CM | POA: Diagnosis not present

## 2021-07-18 DIAGNOSIS — Z09 Encounter for follow-up examination after completed treatment for conditions other than malignant neoplasm: Secondary | ICD-10-CM

## 2021-07-18 MED ORDER — HYDROCORTISONE (PERIANAL) 2.5 % EX CREA: 1.0000 | TOPICAL_CREAM | Freq: Two times a day (BID) | CUTANEOUS | 0 refills | Status: DC

## 2021-07-18 MED ORDER — SODIUM CHLORIDE 0.9 % IV SOLN: 500.0000 mL | Freq: Once | INTRAVENOUS | Status: DC

## 2021-07-18 NOTE — Patient Instructions (Signed)
Handouts given for polyps and diverticulosis.  YOU HAD AN ENDOSCOPIC PROCEDURE TODAY AT [facility]:   Refer to the procedure report that was given to you for any specific questions about what was found during the examination.  If the procedure report does not answer your questions, please call your gastroenterologist to clarify.  If you requested that your care partner not be given the details of your procedure findings, then the procedure report has been included in a sealed envelope for you to review at your convenience later.  YOU SHOULD EXPECT: Some feelings of bloating in the abdomen. Passage of more gas than usual.  Walking can help get rid of the air that was put into your GI tract during the procedure and reduce the bloating. If you had a lower endoscopy (such as a colonoscopy or flexible sigmoidoscopy) you may notice spotting of blood in your stool or on the toilet paper. If you underwent a bowel prep for your procedure, you may not have a normal bowel movement for a few days.  Please Note:  You might notice some irritation and congestion in your nose or some drainage.  This is from the oxygen used during your procedure.  There is no need for concern and it should clear up in a day or so.  SYMPTOMS TO REPORT IMMEDIATELY:  Following lower endoscopy (colonoscopy or flexible sigmoidoscopy):  Excessive amounts of blood in the stool  Significant tenderness or worsening of abdominal pains  Swelling of the abdomen that is new, acute  Fever of 100F or higher  For urgent or emergent issues, a gastroenterologist can be reached at any hour by calling [phone]. Do not use MyChart messaging for urgent concerns.    DIET:  We do recommend a small meal at first, but then you may proceed to your regular diet.  Drink plenty of fluids but you should avoid alcoholic beverages for 24 hours.  ACTIVITY:  You should plan to take it easy for the rest of today and you should NOT DRIVE  or use heavy machinery until tomorrow (because of the sedation medicines used during the test).    FOLLOW UP: Our staff will call the number listed on your records the next business day following your procedure.  We will call around 7:15- 8:00 am to check on you and address any questions or concerns that you may have regarding the information given to you following your procedure. If we do not reach you, we will leave a message.  If you develop any symptoms (ie: fever, flu-like symptoms, shortness of breath, cough etc.) before then, please call [phone].  If you test positive for Covid 19 in the 2 weeks post procedure, please call and report this information to [place].    If any biopsies were taken you will be contacted by phone or by letter within the next 1-3 weeks.  Please call us at [phone] if you have not heard about the biopsies in 3 weeks.    SIGNATURES/CONFIDENTIALITY: You and/or your care partner have signed paperwork which will be entered into your electronic medical record.  These signatures attest to the fact that that the information above on your After Visit Summary has been reviewed and is understood.  Full responsibility of the confidentiality of this discharge information lies with you and/or your care-partner.

## 2021-07-18 NOTE — Op Note (Signed)
[place] Patient Name: Sydney Robinson Procedure Date: 07/18/2021 9:38 AM MRN: [id number] Endoscopist: Sonny Masters "Sydney Robinson ,  Age: 76 Referring MD:  Date of Birth: 1945-05-09 Gender: Female Account #: [id number] Procedure:                Colonoscopy Indications:              High risk colon cancer surveillance: Personal                            history of colonic polyps Medicines:                Monitored Anesthesia Care Procedure:                Pre-Anesthesia Assessment:                           - Prior to the procedure, a History and Physical                            was performed, and patient medications and                            allergies were reviewed. The patient's tolerance of                            previous anesthesia was also reviewed. The risks                            and benefits of the procedure and the sedation                            options and risks were discussed with the patient.                            All questions were answered, and informed consent                            was obtained. Prior Anticoagulants: The patient has                            taken no previous anticoagulant or antiplatelet                            agents. ASA Grade Assessment: II - A patient with                            mild systemic disease. After reviewing the risks                            and benefits, the patient was deemed in                            satisfactory condition to undergo the procedure.  After obtaining informed consent, the colonoscope                            was passed under direct vision. Throughout the                            procedure, the patient's blood pressure, pulse, and                            oxygen saturations were monitored continuously. The                            CF [id number] #[id number] was introduced through the anus                            and advanced to the the terminal  ileum. The                            colonoscopy was performed without difficulty. The                            patient tolerated the procedure well. The quality                            of the bowel preparation was excellent. The                            terminal ileum, ileocecal valve, appendiceal                            orifice, and rectum were photographed. Scope In: 9:49:31 AM Scope Out: 10:07:18 AM Scope Withdrawal Time: 0 hours 12 minutes 54 seconds  Total Procedure Duration: 0 hours 17 minutes 47 seconds  Findings:                 The terminal ileum appeared normal.                           A 5 mm polyp was found in the transverse colon. The                            polyp was sessile. The polyp was removed with a                            cold snare. Resection and retrieval were complete.                           Multiple small and large-mouthed diverticula were                            found in the sigmoid colon.                           Non-bleeding internal hemorrhoids were found during  retroflexion. Complications:            No immediate complications. Estimated Blood Loss:     Estimated blood loss was minimal. Impression:               - The examined portion of the ileum was normal.                           - One 5 mm polyp in the transverse colon, removed                            with a cold snare. Resected and retrieved.                           - Diverticulosis in the sigmoid colon.                           - Non-bleeding internal hemorrhoids. Recommendation:           - Discharge patient to home (with escort).                           - Await pathology results.                           - The findings and recommendations were discussed                            with the patient. Sonny Masters "Sydney Robinson,  07/18/2021 10:10:43 AM

## 2021-07-18 NOTE — Progress Notes (Signed)
VS by DT  Pt's states no medical or surgical changes since previsit or office visit.  

## 2021-07-18 NOTE — Progress Notes (Signed)
GASTROENTEROLOGY PROCEDURE H&P NOTE   Primary Care Physician: [name], MD    Reason for Procedure:   History of colon polyps  Plan:    Colonoscopy  Patient is appropriate for endoscopic procedure(s) in the ambulatory ([place]) setting.  The nature of the procedure, as well as the risks, benefits, and alternatives were carefully and thoroughly reviewed with the patient. Ample time for discussion and questions allowed. The patient understood, was satisfied, and agreed to proceed.     HPI: Sydney Robinson is a 76 y.o. female who presents for colonoscopy for history of colon polyps. Denies changes in bowel habits or weight loss. She does occasional see scant amounts of rectal bleeding, which she attributes to hemorrhoids. Positive for family history of colon cancer in cousin and niece. Last colonoscopy was in 2018 that showed only internal hemorrhoids, recommended repeat in 5 years.  Past Medical History:  Diagnosis Date   Asthma    Colon polyps 1999   adenomatous   Diverticulosis    GERD (gastroesophageal reflux disease)    Heart murmur    Hyperlipidemia    Hypertension    Sleep apnea     Past Surgical History:  Procedure Laterality Date   BREAST EXCISIONAL BIOPSY Bilateral 1970s   CATARACT EXTRACTION Right    COLONOSCOPY W/ POLYPECTOMY  1998    negative 2003 & 2008; 2013   DILATION AND CURETTAGE OF UTERUS     x 3   fibroidectomy     x3   g2 p2     hemorrhoidal banding  2011   Dr Earlean Shawl   Neuroma Resected Foot     Right    OVARIAN CYST REMOVAL     ROTATOR CUFF REPAIR     TONSILLECTOMY      Prior to Admission medications   Medication Sig Start Date End Date Taking? Authorizing Provider  fexofenadine (ALLEGRA) 60 MG tablet Take 60 mg by mouth 2 (two) times daily.   Yes [provider]  hydrochlorothiazide (MICROZIDE) 12.5 MG capsule TAKE 1 CAPSULE BY MOUTH EVERY DAY 03/03/21  Yes Burns, Claudina Lick, MD  labetalol (NORMODYNE) 300 MG tablet TAKE HALF A  TABLETS (150 MG TOTAL) BY MOUTH 2 (TWO) TIMES DAILY. 02/20/21  Yes Burns, Claudina Lick, MD  polycarbophil (FIBERCON) 625 MG tablet Take 625 mg by mouth daily.   Yes [provider]  simvastatin (ZOCOR) 40 MG tablet TAKE 1 TABLET BY MOUTH EVERYDAY AT BEDTIME 02/07/21  Yes Burns, Claudina Lick, MD  albuterol (VENTOLIN HFA) 108 (90 Base) MCG/ACT inhaler TAKE 2 PUFFS BY MOUTH EVERY 6 HOURS AS NEEDED FOR WHEEZE OR SHORTNESS OF BREATH 02/10/21   Spero Geralds, MD  Biotin 1000 MCG tablet as needed.    [provider]  Cholecalciferol (VITAMIN D3 PO) Take 1 tablet by mouth as needed.     [provider]  clonazePAM (KLONOPIN) 0.5 MG tablet TAKE 1/2-1 TABLET AT BEDTIME AS NEEDED 04/25/21   [name], MD  levalbuterol [facility] HFA) 45 MCG/ACT inhaler Inhale 2 puffs into the lungs every 6 (six) hours as needed for wheezing or shortness of breath. Patient not taking: Reported on 07/10/2021 02/10/21   Spero Geralds, MD    Current Outpatient Medications  Medication Sig Dispense Refill   fexofenadine (ALLEGRA) 60 MG tablet Take 60 mg by mouth 2 (two) times daily.     hydrochlorothiazide (MICROZIDE) 12.5 MG capsule TAKE 1 CAPSULE BY MOUTH EVERY DAY 30 capsule 3  labetalol (NORMODYNE) 300 MG tablet TAKE HALF A TABLETS (150 MG TOTAL) BY MOUTH 2 (TWO) TIMES DAILY. 90 tablet 7   polycarbophil (FIBERCON) 625 MG tablet Take 625 mg by mouth daily.     simvastatin (ZOCOR) 40 MG tablet TAKE 1 TABLET BY MOUTH EVERYDAY AT BEDTIME 90 tablet 3   albuterol (VENTOLIN HFA) 108 (90 Base) MCG/ACT inhaler TAKE 2 PUFFS BY MOUTH EVERY 6 HOURS AS NEEDED FOR WHEEZE OR SHORTNESS OF BREATH 18 each 5   Biotin 1000 MCG tablet as needed.     Cholecalciferol (VITAMIN D3 PO) Take 1 tablet by mouth as needed.      clonazePAM (KLONOPIN) 0.5 MG tablet TAKE 1/2-1 TABLET AT BEDTIME AS NEEDED 30 tablet 0   levalbuterol (XOPENEX HFA) 45 MCG/ACT inhaler Inhale 2 puffs into the lungs every 6 (six) hours as needed for wheezing or  shortness of breath. (Patient not taking: Reported on 07/10/2021) 1 each 5   Current Facility-Administered Medications  Medication Dose Route Frequency Provider Last Rate Last Admin   0.9 %  sodium chloride infusion  500 mL Intravenous Once Sharyn Creamer, MD        Allergies as of 07/18/2021 - Review Complete 07/18/2021  Allergen Reaction Noted   Shellfish allergy Diarrhea, Nausea And Vomiting, and Other (See Comments) 07/04/2016    Family History  Problem Relation Age of Onset   Diabetes Mother    Alzheimer's disease Mother    Heart failure Mother    Glaucoma Mother    Arthritis Mother    Colon polyps Sister    Breast cancer Sister        2 sisters    Heart disease Sister    Diverticulitis Sister    Breast cancer Sister 5   Glaucoma Sister    Heart attack Maternal Aunt 59   Diabetes Maternal Aunt    Diabetes Maternal Uncle    Heart attack Maternal Uncle    Stroke Paternal Aunt 65       cns aneurysm   Uterine cancer Paternal Aunt    Diabetes Paternal Aunt    Diabetes Maternal Grandmother    Stroke Maternal Grandfather    Diabetes Other        MGGM   Colon cancer Other        cousin   Esophageal cancer Neg Hx    Stomach cancer Neg Hx    Rectal cancer Neg Hx     Social History   Socioeconomic History   Marital status: Widowed    Spouse name: Not on file   Number of children: Not on file   Years of education: Not on file   Highest education level: Not on file  Occupational History   Not on file  Tobacco Use   Smoking status: Never   Smokeless tobacco: Never  Vaping Use   Vaping Use: Never used  Substance and Sexual Activity   Alcohol use: No    Alcohol/week: 0.0 standard drinks of alcohol    Comment: Wine rarely   Drug use: No   Sexual activity: Not on file  Other Topics Concern   Not on file  Social History Narrative   Not on file   Social Determinants of Health   Financial Resource Strain: Low Risk  (02/18/2020)   Overall Financial Resource  Strain (CARDIA)    Difficulty of Paying Living Expenses: Not hard at all  Food Insecurity: No Food Insecurity (02/18/2020)   Hunger Vital Sign    Worried  About Running Out of Food in the Last Year: Never true    Ran Out of Food in the Last Year: Never true  Transportation Needs: No Transportation Needs (02/18/2020)   PRAPARE - Hydrologist (Medical): No    Lack of Transportation (Non-Medical): No  Physical Activity: Sufficiently Active (02/18/2020)   Exercise Vital Sign    Days of Exercise per Week: 5 days    Minutes of Exercise per Session: 30 min  Stress: No Stress Concern Present (02/18/2020)   [place]    Feeling of Stress : Not at all  Social Connections: Moderately Integrated (02/18/2020)   Social Connection and Isolation Panel [NHANES]    Frequency of Communication with Friends and Family: More than three times a week    Frequency of Social Gatherings with Friends and Family: More than three times a week    Attends Religious Services: More than 4 times per year    Active Member of [employer] or Organizations: Yes    Attends Archivist Meetings: More than 4 times per year    Marital Status: Widowed  Human resources officer Violence: Not on file    Physical Exam: Vital signs in last 24 hours: BP (!) 104/51   Pulse 61   Temp (!) 96.9 F (36.1 C)   Ht '5\' 6"'$  (1.676 m)   Wt 203 lb (92.1 kg)   SpO2 99%   BMI 32.77 kg/m  GEN: NAD EYE: Sclerae anicteric ENT: MMM CV: Non-tachycardic Pulm: No increased work of breathing GI: Soft, NT/ND NEURO:  Alert & Oriented   Christia Reading, MD [facility]  07/18/2021 9:32 AM

## 2021-07-18 NOTE — Progress Notes (Signed)
A and O x3. Report to RN. Tolerated MAC anesthesia well. 

## 2021-07-18 NOTE — Progress Notes (Signed)
Called to room to assist during endoscopic procedure.  Patient ID and intended procedure confirmed with present staff. Received instructions for my participation in the procedure from the performing physician.  

## 2021-07-19 ENCOUNTER — Telehealth: Payer: Self-pay | Admitting: *Deleted

## 2021-07-19 NOTE — Telephone Encounter (Signed)
  Follow up Call-     07/18/2021    8:45 AM  Call back number  Post procedure Call Back phone  # [phone]  Permission to leave phone message Yes     Patient questions:  Do you have a fever, pain , or abdominal swelling? No. Pain Score  0 *  Have you tolerated food without any problems? Yes.    Have you been able to return to your normal activities? Yes.    Do you have any questions about your discharge instructions: Diet   No. Medications  No. Follow up visit  No.  Do you have questions or concerns about your Care? No.  Actions: * If pain score is 4 or above: No action needed, pain <4.

## 2021-07-21 ENCOUNTER — Encounter: Payer: Self-pay | Admitting: Internal Medicine

## 2021-07-29 ENCOUNTER — Other Ambulatory Visit: Payer: Self-pay | Admitting: Internal Medicine

## 2021-08-03 ENCOUNTER — Ambulatory Visit: Payer: Medicare PPO | Admitting: Internal Medicine

## 2021-08-09 ENCOUNTER — Ambulatory Visit: Payer: Medicare PPO

## 2021-08-13 ENCOUNTER — Other Ambulatory Visit: Payer: Self-pay | Admitting: Internal Medicine

## 2022-01-05 ENCOUNTER — Other Ambulatory Visit: Payer: Self-pay | Admitting: Internal Medicine

## 2022-01-09 ENCOUNTER — Other Ambulatory Visit: Payer: Self-pay | Admitting: Internal Medicine

## 2022-01-09 DIAGNOSIS — Z1231 Encounter for screening mammogram for malignant neoplasm of breast: Secondary | ICD-10-CM

## 2022-02-04 NOTE — Patient Instructions (Addendum)
Blood work was ordered.   The lab is on the first floor.    Medications changes include :   I agree with stopping the hydrochlorothiazide.  Increase labetalol to 200 mg twice a day     Return in about 1 year (around 02/07/2023) for Physical Exam.   Health Maintenance, Female Adopting a healthy lifestyle and getting preventive care are important in promoting health and wellness. Ask your health care provider about: The right schedule for you to have regular tests and exams. Things you can do on your own to prevent diseases and keep yourself healthy. What should I know about diet, weight, and exercise? Eat a healthy diet  Eat a diet that includes plenty of vegetables, fruits, low-fat dairy products, and lean protein. Do not eat a lot of foods that are high in solid fats, added sugars, or sodium. Maintain a healthy weight Body mass index (BMI) is used to identify weight problems. It estimates body fat based on height and weight. Your health care provider can help determine your BMI and help you achieve or maintain a healthy weight. Get regular exercise Get regular exercise. This is one of the most important things you can do for your health. Most adults should: Exercise for at least 150 minutes each week. The exercise should increase your heart rate and make you sweat (moderate-intensity exercise). Do strengthening exercises at least twice a week. This is in addition to the moderate-intensity exercise. Spend less time sitting. Even light physical activity can be beneficial. Watch cholesterol and blood lipids Have your blood tested for lipids and cholesterol at 75 years of age, then have this test every 5 years. Have your cholesterol levels checked more often if: Your lipid or cholesterol levels are high. You are older than 75 years of age. You are at high risk for heart disease. What should I know about cancer screening? Depending on your health history and family history,  you may need to have cancer screening at various ages. This may include screening for: Breast cancer. Cervical cancer. Colorectal cancer. Skin cancer. Lung cancer. What should I know about heart disease, diabetes, and high blood pressure? Blood pressure and heart disease High blood pressure causes heart disease and increases the risk of stroke. This is more likely to develop in people who have high blood pressure readings or are overweight. Have your blood pressure checked: Every 3-5 years if you are 22-44 years of age. Every year if you are 102 years old or older. Diabetes Have regular diabetes screenings. This checks your fasting blood sugar level. Have the screening done: Once every three years after age 33 if you are at a normal weight and have a low risk for diabetes. More often and at a younger age if you are overweight or have a high risk for diabetes. What should I know about preventing infection? Hepatitis B If you have a higher risk for hepatitis B, you should be screened for this virus. Talk with your health care provider to find out if you are at risk for hepatitis B infection. Hepatitis C Testing is recommended for: Everyone born from [birth year] through 1963. Anyone with known risk factors for hepatitis C. Sexually transmitted infections (STIs) Get screened for STIs, including gonorrhea and chlamydia, if: You are sexually active and are younger than 75 years of age. You are older than 75 years of age and your health care provider tells you that you are at risk for this type of infection.  Your sexual activity has changed since you were last screened, and you are at increased risk for chlamydia or gonorrhea. Ask your health care provider if you are at risk. Ask your health care provider about whether you are at high risk for HIV. Your health care provider may recommend a prescription medicine to help prevent HIV infection. If you choose to take medicine to prevent HIV, you should  first get tested for HIV. You should then be tested every 3 months for as long as you are taking the medicine. Pregnancy If you are about to stop having your period (premenopausal) and you may become pregnant, seek counseling before you get pregnant. Take 400 to 800 micrograms (mcg) of folic acid every day if you become pregnant. Ask for birth control (contraception) if you want to prevent pregnancy. Osteoporosis and menopause Osteoporosis is a disease in which the bones lose minerals and strength with aging. This can result in bone fractures. If you are 15 years old or older, or if you are at risk for osteoporosis and fractures, ask your health care provider if you should: Be screened for bone loss. Take a calcium or vitamin D supplement to lower your risk of fractures. Be given hormone replacement therapy (HRT) to treat symptoms of menopause. Follow these instructions at home: Alcohol use Do not drink alcohol if: Your health care provider tells you not to drink. You are pregnant, may be pregnant, or are planning to become pregnant. If you drink alcohol: Limit how much you have to: 0-1 drink a day. Know how much alcohol is in your drink. In the [place] one drink equals one 12 oz bottle of beer (355 mL), one 5 oz glass of wine (148 mL), or one 1 oz glass of hard liquor (44 mL). Lifestyle Do not use any products that contain nicotine or tobacco. These products include cigarettes, chewing tobacco, and vaping devices, such as e-cigarettes. If you need help quitting, ask your health care provider. Do not use street drugs. Do not share needles. Ask your health care provider for help if you need support or information about quitting drugs. General instructions Schedule regular health, dental, and eye exams. Stay current with your vaccines. Tell your health care provider if: You often feel depressed. You have ever been abused or do not feel safe at home. Summary Adopting a healthy lifestyle  and getting preventive care are important in promoting health and wellness. Follow your health care provider's instructions about healthy diet, exercising, and getting tested or screened for diseases. Follow your health care provider's instructions on monitoring your cholesterol and blood pressure. This information is not intended to replace advice given to you by your health care provider. Make sure you discuss any questions you have with your health care provider. Document Revised: 05/16/2020 Document Reviewed: 05/16/2020 Elsevier Patient Education  [place].

## 2022-02-04 NOTE — Progress Notes (Unsigned)
Subjective:    Patient ID: Sydney Robinson, female    DOB: 1945/10/09, 77 y.o.   MRN: [id number]      HPI Sydney Robinson is here for a Physical exam.    Lost sister last month - had bladder ca, breast ca.  Overall doing well with it.  Decreased kidney function x 2-I reviewed this with her  Was lightheaded and BP was low so she stopped her hydrochlorothiazide.  BP at home  - variable -- DBP has been up to 90  Medications and allergies reviewed with patient and updated if appropriate.  Current Outpatient Medications on File Prior to Visit  Medication Sig Dispense Refill   albuterol (VENTOLIN HFA) 108 (90 Base) MCG/ACT inhaler TAKE 2 PUFFS BY MOUTH EVERY 6 HOURS AS NEEDED FOR WHEEZE OR SHORTNESS OF BREATH 18 each 5   Biotin 1000 MCG tablet as needed.     Cholecalciferol (VITAMIN D3 PO) Take 1 tablet by mouth as needed.      clonazePAM (KLONOPIN) 0.5 MG tablet TAKE 1/2-1 TABLET AT BEDTIME AS NEEDED 30 tablet 0   fexofenadine (ALLEGRA) 60 MG tablet Take 60 mg by mouth 2 (two) times daily.     polycarbophil (FIBERCON) 625 MG tablet Take 625 mg by mouth daily.     simvastatin (ZOCOR) 40 MG tablet TAKE 1 TABLET BY MOUTH EVERYDAY AT BEDTIME 90 tablet 3   No current facility-administered medications on file prior to visit.    Review of Systems  Constitutional:  Negative for fever.  Eyes:  Negative for visual disturbance.  Respiratory:  Negative for cough, shortness of breath and wheezing.   Cardiovascular:  Negative for chest pain, palpitations and leg swelling.  Gastrointestinal:  Negative for abdominal pain, blood in stool, constipation and diarrhea.       No gerd  Genitourinary:  Negative for dysuria.  Musculoskeletal:  Negative for arthralgias and back pain.  Skin:  Negative for rash.  Neurological:  Negative for light-headedness and headaches.  Psychiatric/Behavioral:  Negative for dysphoric mood. The patient is not nervous/anxious.        Objective:   Vitals:   02/06/22  1028  BP: 126/80  Pulse: 75  Temp: 98.1 F (36.7 C)  SpO2: 97%   Filed Weights   02/06/22 1028  Weight: 202 lb (91.6 kg)   Body mass index is 32.6 kg/m.  BP Readings from Last 3 Encounters:  02/06/22 126/80  07/18/21 136/74  07/10/21 119/70    Wt Readings from Last 3 Encounters:  02/06/22 202 lb (91.6 kg)  07/18/21 203 lb (92.1 kg)  07/10/21 204 lb 9.6 oz (92.8 kg)       Physical Exam Constitutional: She appears well-developed and well-nourished. No distress.  HENT:  Head: Normocephalic and atraumatic.  Right Ear: External ear normal. Normal ear canal and TM Left Ear: External ear normal.  Normal ear canal and TM Mouth/Throat: Oropharynx is clear and moist.  Eyes: Conjunctivae normal.  Neck: Neck supple. No tracheal deviation present. No thyromegaly present.  No carotid bruit  Cardiovascular: Normal rate, regular rhythm and normal heart sounds.   No murmur heard.  No edema. Pulmonary/Chest: Effort normal and breath sounds normal. No respiratory distress. She has no wheezes. She has no rales.  Breast: deferred   Abdominal: Soft. She exhibits no distension. There is no tenderness.  Lymphadenopathy: She has no cervical adenopathy.  Skin: Skin is warm and dry. She is not diaphoretic.  Psychiatric: She has a normal mood and affect.  Her behavior is normal.     Lab Results  Component Value Date   WBC 5.1 01/31/2021   HGB 13.3 01/31/2021   HCT 39.8 01/31/2021   PLT 203.0 01/31/2021   GLUCOSE 105 (H) 01/31/2021   CHOL 189 01/31/2021   TRIG 102.0 01/31/2021   HDL 67.50 01/31/2021   LDLDIRECT 130.5 12/29/2012   LDLCALC 101 (H) 01/31/2021   ALT 8 01/31/2021   AST 14 01/31/2021   NA 141 01/31/2021   K 3.9 01/31/2021   CL 104 01/31/2021   CREATININE 1.01 01/31/2021   BUN 13 01/31/2021   CO2 30 01/31/2021   TSH 2.17 01/28/2020   HGBA1C 5.6 01/31/2021         Assessment & Plan:   Physical exam: Screening blood work  ordered [facility] - will go 2-3 times a week Weight  encouraged weight loss Substance abuse  none   Reviewed recommended immunizations.   Health Maintenance  Topic Date Due   DTaP/Tdap/Td (2 - Tdap) 07/30/2018   Medicare Annual Wellness (AWV)  02/17/2021   COVID-19 Vaccine (6 - 2023-24 season) 02/22/2022 (Originally 09/08/2021)   DEXA SCAN  03/02/2025   COLONOSCOPY (Pts 45-53yr Insurance coverage will need to be confirmed)  07/19/2026   Pneumonia Vaccine 75 Years old  Completed   INFLUENZA VACCINE  Completed   Hepatitis C Screening  Completed   HPV VACCINES  Aged Out   Zoster Vaccines- Shingrix  Discontinued          See Problem List for Assessment and Plan of chronic medical problems.

## 2022-02-06 ENCOUNTER — Encounter: Payer: Self-pay | Admitting: Internal Medicine

## 2022-02-06 ENCOUNTER — Ambulatory Visit (INDEPENDENT_AMBULATORY_CARE_PROVIDER_SITE_OTHER): Payer: Medicare Other | Admitting: Internal Medicine

## 2022-02-06 VITALS — BP 126/80 | HR 75 | Temp 98.1°F | Ht 66.0 in | Wt 202.0 lb

## 2022-02-06 DIAGNOSIS — G4709 Other insomnia: Secondary | ICD-10-CM | POA: Diagnosis not present

## 2022-02-06 DIAGNOSIS — I10 Essential (primary) hypertension: Secondary | ICD-10-CM | POA: Diagnosis not present

## 2022-02-06 DIAGNOSIS — R944 Abnormal results of kidney function studies: Secondary | ICD-10-CM

## 2022-02-06 DIAGNOSIS — Z0000 Encounter for general adult medical examination without abnormal findings: Secondary | ICD-10-CM

## 2022-02-06 DIAGNOSIS — E7849 Other hyperlipidemia: Secondary | ICD-10-CM

## 2022-02-06 DIAGNOSIS — R7303 Prediabetes: Secondary | ICD-10-CM

## 2022-02-06 DIAGNOSIS — G4733 Obstructive sleep apnea (adult) (pediatric): Secondary | ICD-10-CM

## 2022-02-06 DIAGNOSIS — I77810 Thoracic aortic ectasia: Secondary | ICD-10-CM

## 2022-02-06 LAB — COMPREHENSIVE METABOLIC PANEL
ALT: 7 U/L (ref 0–35)
AST: 11 U/L (ref 0–37)
Albumin: 4.7 g/dL (ref 3.5–5.2)
Alkaline Phosphatase: 61 U/L (ref 39–117)
BUN: 10 mg/dL (ref 6–23)
CO2: 29 mEq/L (ref 19–32)
Calcium: 9.6 mg/dL (ref 8.4–10.5)
Chloride: 106 mEq/L (ref 96–112)
Creatinine, Ser: 0.94 mg/dL (ref 0.40–1.20)
GFR: 59.54 mL/min — ABNORMAL LOW (ref >60.00)
Glucose, Bld: 96 mg/dL (ref 70–99)
Potassium: 4.2 mEq/L (ref 3.5–5.1)
Sodium: 142 mEq/L (ref 135–145)
Total Bilirubin: 1.1 mg/dL (ref 0.2–1.2)
Total Protein: 7 g/dL (ref 6.0–8.3)

## 2022-02-06 LAB — URINALYSIS, ROUTINE W REFLEX MICROSCOPIC
Bilirubin Urine: NEGATIVE
Ketones, ur: NEGATIVE
Nitrite: NEGATIVE
Specific Gravity, Urine: 1.025 (ref 1.000–1.030)
Total Protein, Urine: NEGATIVE
Urine Glucose: NEGATIVE
Urobilinogen, UA: 0.2 (ref 0.0–1.0)
pH: 6 (ref 5.0–8.0)

## 2022-02-06 LAB — LIPID PANEL
Cholesterol: 186 mg/dL (ref 0–200)
HDL: 75.3 mg/dL (ref >39.00)
LDL Cholesterol: 90 mg/dL (ref 0–99)
NonHDL: 111.07
Total CHOL/HDL Ratio: 2
Triglycerides: 104 mg/dL (ref 0.0–149.0)
VLDL: 20.8 mg/dL (ref 0.0–40.0)

## 2022-02-06 LAB — CBC WITH DIFFERENTIAL/PLATELET
Basophils Absolute: 0 10*3/uL (ref 0.0–0.1)
Basophils Relative: 0.4 % (ref 0.0–3.0)
Eosinophils Absolute: 0.1 10*3/uL (ref 0.0–0.7)
Eosinophils Relative: 1.1 % (ref 0.0–5.0)
HCT: 40.8 % (ref 36.0–46.0)
Hemoglobin: 14 g/dL (ref 12.0–15.0)
Lymphocytes Relative: 39.8 % (ref 12.0–46.0)
Lymphs Abs: 1.9 10*3/uL (ref 0.7–4.0)
MCHC: 34.2 g/dL (ref 30.0–36.0)
MCV: 86.7 fl (ref 78.0–100.0)
Monocytes Absolute: 0.4 10*3/uL (ref 0.1–1.0)
Monocytes Relative: 8.1 % (ref 3.0–12.0)
Neutro Abs: 2.4 10*3/uL (ref 1.4–7.7)
Neutrophils Relative %: 50.6 % (ref 43.0–77.0)
Platelets: 228 10*3/uL (ref 150.0–400.0)
RBC: 4.7 Mil/uL (ref 3.87–5.11)
RDW: 13.3 % (ref 11.5–15.5)
WBC: 4.8 10*3/uL (ref 4.0–10.5)

## 2022-02-06 LAB — HEMOGLOBIN A1C: Hgb A1c MFr Bld: 5.5 % (ref 4.6–6.5)

## 2022-02-06 LAB — TSH: TSH: 2.01 u[IU]/mL (ref 0.35–5.50)

## 2022-02-06 LAB — MICROALBUMIN / CREATININE URINE RATIO
Creatinine,U: 179 mg/dL
Microalb Creat Ratio: 0.4 mg/g (ref 0.0–30.0)
Microalb, Ur: <0.7 mg/dL (ref 0.0–1.9)

## 2022-02-06 MED ORDER — LABETALOL HCL 200 MG PO TABS: 200.0000 mg | ORAL_TABLET | Freq: Two times a day (BID) | ORAL | 1 refills | Status: DC

## 2022-02-06 NOTE — Assessment & Plan Note (Addendum)
GFR decreased the last 2 times with blood work Reviewed with patient Avoid NSAIDs (which she does not take), increase water intake Low-salt diet Work on weight loss Stressed blood pressure and sugar control CMP, urinalysis, urine microalbumin

## 2022-02-06 NOTE — Assessment & Plan Note (Signed)
Chronic  wearing cpap nightly

## 2022-02-06 NOTE — Assessment & Plan Note (Signed)
Chronic Check a1c Low sugar / carb diet Stressed regular exercise  Lab Results  Component Value Date   HGBA1C 5.6 01/31/2021

## 2022-02-06 NOTE — Assessment & Plan Note (Signed)
Chronic Regular exercise and healthy diet encouraged Check lipid panel  Continue simvastatin 40 mg daily

## 2022-02-06 NOTE — Assessment & Plan Note (Signed)
Chronic Monitored by cardiology

## 2022-02-06 NOTE — Assessment & Plan Note (Signed)
Chronic Intermittent Continue clonazepam 0.25-0.5 mg bedtime as needed-she does not take this often and encouraged her to use only as needed

## 2022-02-06 NOTE — Assessment & Plan Note (Addendum)
Chronic Stopped hctz for lightheadedness - she is no longer lightheaded Blood pressure well controlled here but has been high at times at home when blood pressure is elevated some may not be overall well-controlled CMP Continue labetalol but increase to 200 mg twice daily

## 2022-02-15 ENCOUNTER — Other Ambulatory Visit: Payer: Self-pay | Admitting: Internal Medicine

## 2022-02-15 ENCOUNTER — Other Ambulatory Visit: Payer: Self-pay

## 2022-03-01 ENCOUNTER — Ambulatory Visit
Admission: RE | Admit: 2022-03-01 | Discharge: 2022-03-01 | Disposition: A | Discharge status: Home / Self Care | Payer: Medicare Other | Source: Ambulatory Visit | Attending: Internal Medicine | Admitting: Internal Medicine

## 2022-03-01 DIAGNOSIS — Z1231 Encounter for screening mammogram for malignant neoplasm of breast: Secondary | ICD-10-CM

## 2022-03-06 ENCOUNTER — Other Ambulatory Visit: Payer: Self-pay | Admitting: Internal Medicine

## 2022-03-06 DIAGNOSIS — R928 Other abnormal and inconclusive findings on diagnostic imaging of breast: Secondary | ICD-10-CM

## 2022-03-13 ENCOUNTER — Ambulatory Visit
Admission: RE | Admit: 2022-03-13 | Discharge: 2022-03-13 | Disposition: A | Discharge status: Home / Self Care | Payer: Medicare Other | Source: Ambulatory Visit | Attending: Internal Medicine | Admitting: Internal Medicine

## 2022-03-13 DIAGNOSIS — R928 Other abnormal and inconclusive findings on diagnostic imaging of breast: Secondary | ICD-10-CM

## 2022-03-29 ENCOUNTER — Other Ambulatory Visit: Payer: Self-pay | Admitting: Surgery

## 2022-04-09 ENCOUNTER — Other Ambulatory Visit: Payer: Self-pay | Admitting: Internal Medicine

## 2022-04-16 ENCOUNTER — Other Ambulatory Visit: Payer: Self-pay | Admitting: Internal Medicine

## 2022-04-17 ENCOUNTER — Other Ambulatory Visit: Payer: Self-pay | Admitting: Surgery

## 2022-07-30 ENCOUNTER — Other Ambulatory Visit: Payer: Self-pay | Admitting: Internal Medicine

## 2022-09-03 ENCOUNTER — Ambulatory Visit (INDEPENDENT_AMBULATORY_CARE_PROVIDER_SITE_OTHER): Payer: Medicare Other | Admitting: Internal Medicine

## 2022-09-03 ENCOUNTER — Encounter: Payer: Self-pay | Admitting: Internal Medicine

## 2022-09-03 VITALS — BP 104/70 | HR 69 | Ht 67.0 in | Wt 206.0 lb

## 2022-09-03 DIAGNOSIS — R0602 Shortness of breath: Secondary | ICD-10-CM | POA: Diagnosis not present

## 2022-09-03 DIAGNOSIS — G4733 Obstructive sleep apnea (adult) (pediatric): Secondary | ICD-10-CM

## 2022-09-03 NOTE — Patient Instructions (Signed)
Please schedule follow up scheduled with myself in 1 year.  If my schedule is not open yet, we will contact you with a reminder closer to that time. Please call [phone] if you haven't heard from [place] a month before.   We will send in an order for mask fitting to [employer]. You can call them to try a different mask.   Your CPAP is otherwise doing well.   Take the albuterol rescue inhaler every 4 to 6 hours as needed for wheezing or shortness of breath. You can also take it 15 minutes before exercise or exertional activity. Side effects include heart racing or pounding, jitters or anxiety. If you have a history of an irregular heart rhythm, it can make this worse. Can also give some patients a hard time sleeping.

## 2022-09-03 NOTE — Progress Notes (Signed)
Sydney Robinson    [id number]    04-25-1945  Primary Care Physician:Burns, Bobette Mo, MD Date of Appointment: 09/03/2022 Established Patient Visit  Chief complaint:   Chief Complaint  Patient presents with   Follow-up    Pt has been using her cpap.      HPI: Sydney Robinson is a 77 y.o. with shortness of breath and OSA who presents for follow up.    Interval Updates: Here for follow up for OSA.   CPAP download reviewed - *87% adherence>4 hours. Median pressure 6.9 cm H20 median leak 3.98 but maximum up to 100.9. AHI 2.9.   Minimal albuterol use but she wonders if she should use it more with exertion.  Wakes up at night and her mouth is open - current mask is nasal pillows.  Daughter notes she occasionally mouth breathes and is concerned about this. Denies nasal congestion.   I have reviewed the patient's family social and past medical history and updated as appropriate.   Past Medical History:  Diagnosis Date   Asthma    Colon polyps 1999   adenomatous   Diverticulosis    GERD (gastroesophageal reflux disease)    Heart murmur    Hyperlipidemia    Hypertension    Sleep apnea     Past Surgical History:  Procedure Laterality Date   BREAST EXCISIONAL BIOPSY Bilateral 1970s   CATARACT EXTRACTION Right    COLONOSCOPY W/ POLYPECTOMY  1998    negative 2003 & 2008; 2013   DILATION AND CURETTAGE OF UTERUS     x 3   fibroidectomy     x3   g2 p2     hemorrhoidal banding  2011   Dr Kinnie Scales   Neuroma Resected Foot     Right    OVARIAN CYST REMOVAL     ROTATOR CUFF REPAIR     TONSILLECTOMY      Family History  Problem Relation Age of Onset   Diabetes Mother    Alzheimer's disease Mother    Heart failure Mother    Glaucoma Mother    Arthritis Mother    Colon polyps Sister    Breast cancer Sister        2 sisters    Heart disease Sister    Bladder Cancer Sister    Diverticulitis Sister    Breast cancer Sister 101   Glaucoma Sister    Heart attack  Maternal Aunt 59   Diabetes Maternal Aunt    Diabetes Maternal Uncle    Heart attack Maternal Uncle    Stroke Paternal Aunt 65       cns aneurysm   Uterine cancer Paternal Aunt    Diabetes Paternal Aunt    Diabetes Maternal Grandmother    Stroke Maternal Grandfather    Diabetes Other        MGGM   Colon cancer Other        cousin   Esophageal cancer Neg Hx    Stomach cancer Neg Hx    Rectal cancer Neg Hx     Social History   Occupational History   Not on file  Tobacco Use   Smoking status: Never   Smokeless tobacco: Never  Vaping Use   Vaping status: Never Used  Substance and Sexual Activity   Alcohol use: No    Alcohol/week: 0.0 standard drinks of alcohol    Comment: Wine rarely   Drug use: No   Sexual  activity: Not on file     Physical Exam: Blood pressure 104/70, pulse 69, height 5\' 7"  (1.702 m), weight 206 lb (93.4 kg), SpO2 100%.  Gen:      No acute distress Lungs:   ctab no wheezes or crackles CV:        RRR no mrg   Data Reviewed: Imaging: I have personally reviewed the chest xray April 2021. No acute process. Relatively unchanged from chest xray 2013  PFTs:      Latest Ref Rng & Units 08/21/2019   12:50 PM  PFT Results  FVC-Pre L 2.46   FVC-Predicted Pre % 92   FVC-Post L 2.53   FVC-Predicted Post % 95   Pre FEV1/FVC % % 81   Post FEV1/FCV % % 85   FEV1-Pre L 1.99   FEV1-Predicted Pre % 96   FEV1-Post L 2.14   DLCO uncorrected ml/min/mmHg 18.24   DLCO UNC% % 85   DLCO corrected ml/min/mmHg 18.24   DLCO COR %Predicted % 85   DLVA Predicted % 111   TLC L 4.22   TLC % Predicted % 76   RV % Predicted % 77    I have personally reviewed the patient's PFTs and there is no airflow limitation and mild restriction to ventilation likely secondary to body habitus.  Echocardiogram 05/20/2019  1. Left ventricular ejection fraction, by estimation, is 60 to 65%. The  left ventricle has normal function. The left ventricle has no regional  wall  motion abnormalities. Left ventricular diastolic parameters are  consistent with Grade I diastolic  dysfunction (impaired relaxation).   2. Right ventricular systolic function is normal. The right ventricular  size is normal. Tricuspid regurgitation signal is inadequate for assessing  PA pressure.   3. The mitral valve is abnormal. Trivial mitral valve regurgitation.   4. The aortic valve is tricuspid. Aortic valve regurgitation is not  visualized. Mild aortic valve sclerosis is present, with no evidence of  aortic valve stenosis.   5. Aortic dilatation noted. 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   6. The inferior vena cava is normal in size with greater than 50%  respiratory variability, suggesting right atrial pressure of 3 mmHg.  Labs: Lab Results  Component Value Date   WBC 4.8 02/06/2022   HGB 14.0 02/06/2022   HCT 40.8 02/06/2022   MCV 86.7 02/06/2022   PLT 228.0 02/06/2022   Lab Results  Component Value Date   NA 142 02/06/2022   K 4.2 02/06/2022   CL 106 02/06/2022   CO2 29 02/06/2022    Immunization status: Immunization History  Administered Date(s) Administered   Fluad Quad(high Dose 65+) 08/27/2020   Influenza Split 11/15/2010   Influenza Whole 01/08/2001, 10/08/2007   Influenza, High Dose Seasonal PF 11/01/2014, 10/08/2017, 08/26/2018   Influenza, Seasonal, Injecte, Preservative Fre 12/03/2011   Influenza,inj,Quad PF,6+ Mos 10/13/2013   Influenza-Unspecified 10/14/2012, 11/01/2014, 10/05/2015, 10/08/2016, 08/20/2018, 08/26/2019, 09/14/2021   PFIZER(Purple Top)SARS-COV-2 Vaccination 02/03/2019, 03/06/2019, 10/05/2019, 04/10/2020   Pfizer Covid-19 Vaccine Bivalent Booster 29yrs & up 11/09/2020   Pneumococcal Conjugate-13 12/14/2014   Pneumococcal Polysaccharide-23 12/23/2015   Td 07/29/2008    Assessment:  Shortness of Breath with mild restriction to ventilation OSA on CPAP, controlled Allergic  Rhinitis   Plan/Recommendations: Cpap download reviewed. Excellent adherence. Improved symptoms of OSA. However having more leak. Will send in order for mask of best fit.  Continue albuterol as needed - take before exercise/exertion.    Return  to Care: Return in about 1 year (around 09/03/2023).   Durel Salts, MD Pulmonary and Critical Care Medicine [facility] [phone]

## 2022-09-11 ENCOUNTER — Encounter: Payer: Self-pay | Admitting: Internal Medicine

## 2022-09-24 ENCOUNTER — Other Ambulatory Visit: Payer: Self-pay

## 2022-09-24 ENCOUNTER — Other Ambulatory Visit: Payer: Self-pay | Admitting: Internal Medicine

## 2022-09-24 DIAGNOSIS — R0602 Shortness of breath: Secondary | ICD-10-CM

## 2022-09-24 MED ORDER — ALBUTEROL SULFATE HFA 108 (90 BASE) MCG/ACT IN AERS
2.0000 | INHALATION_SPRAY | Freq: Four times a day (QID) | RESPIRATORY_TRACT | 5 refills | Status: AC | PRN
Start: 2022-09-24 — End: ?

## 2022-11-01 NOTE — Progress Notes (Signed)
No meds okay to somebody, lasting 1 year they are because the answer Cardiology Office Note:   Date:  11/02/2022  ID:  Sydney Robinson, DOB 1945/08/01, MRN [id number] PCP: Pincus Sanes, MD  [facility] Providers Cardiologist:  Rollene Rotunda, MD {  History of Present Illness:   Sydney Robinson is a 77 y.o. female who presents for evaluation of SOB.  She was seen by Dr. Duke Salvia.   She had a negative Lexiscan Myoview.  Echo showed a normal EF and mild diastolic dysfunction.    She had some mild sleep apnea diagnosed.  She is resting with CPAP.  She has had pulmonary function testing without significant findings.    She called because she was having some low blood pressures.  This was happening sporadically.  However, she now recognizes that might have been when she was standing or she may be change positions and then she would feel bad.  She would sit down and take her blood pressure and has noticed occasionally systolics in the 100 range.  She is not noticing anything really much lower than that.  She is not noticing any symptomatic bradycardia arrhythmias.  Her symptoms pass relatively quickly.  At other times her blood pressure is well-controlled.  She used to take 300 mg twice daily of labetalol but this was reduced to some months ago and she thinks she has done well with 200 mg twice daily.  She has not had any frank syncope.  She is not having any chest discomfort, neck or arm discomfort.  She has had no new shortness of breath, PND or orthopnea.  She is using her CPAP but she actually wants to try to stay off the mask for a little while because it is making her have some "bags under my eyes."  ROS: As stated in the HPI and negative for all other systems.   Studies Reviewed:    EKG:   EKG Interpretation Date/Time:  Friday November 02 2022 12:26:50 EDT Ventricular Rate:  54 PR Interval:  162 QRS Duration:  88 QT Interval:  432 QTC Calculation: 409 R Axis:   9  Text  Interpretation: Sinus bradycardia Possible Left atrial enlargement No significant change since last tracing Confirmed by Rollene Rotunda ([id number]) on 11/02/2022 12:42:54 PM     Risk Assessment/Calculations:       Physical Exam:   VS:  BP (!) 140/88 (BP Location: Left Arm, Patient Position: Sitting, Cuff Size: Normal)   Pulse 63   Ht 5\' 7"  (1.702 m)   Wt 202 lb 9.6 oz (91.9 kg)   SpO2 98%   BMI 31.73 kg/m    Wt Readings from Last 3 Encounters:  11/02/22 202 lb 9.6 oz (91.9 kg)  09/03/22 206 lb (93.4 kg)  02/06/22 202 lb (91.6 kg)     GEN: Well nourished, well developed in no acute distress NECK: No JVD; No carotid bruits CARDIAC: RRR, no murmurs, rubs, gallops RESPIRATORY:  Clear to auscultation without rales, wheezing or rhonchi  ABDOMEN: Soft, non-tender, non-distended EXTREMITIES:  No edema; No deformity   ASSESSMENT AND PLAN:   SOB: She does get a little short of breath with activity but this is not changed compared to what she had at the time of her workup with stress testing and pulmonary function testing.  No further workup is indicated.   HTN: Her blood pressure is typically well-controlled now.  It sounds like she does have some occasional drops in her blood pressure and  this may be after conversation orthostatic.  We discussed this at length.  She will use compression garments.  I would not want to reduce her medications as overall is well-controlled.  She was satisfied with this conversation.    SYNCOPE:   As above she has not had this in quite a while.  This was presumed orthostatic.  We discussed this again and I think she is tolerating the meds as listed.  No further workup.  She will continue to follow her heart rate and blood pressures on the wearable.  She does have some low heart rates but they are not particularly problematic.        Follow up with APP in about 10 months.   Signed, Rollene Rotunda, MD

## 2022-11-02 ENCOUNTER — Encounter: Payer: Self-pay | Admitting: Cardiology

## 2022-11-02 ENCOUNTER — Ambulatory Visit: Payer: Medicare Other | Attending: Cardiology | Admitting: Cardiology

## 2022-11-02 VITALS — BP 140/88 | HR 63 | Ht 67.0 in | Wt 202.6 lb

## 2022-11-02 DIAGNOSIS — I10 Essential (primary) hypertension: Secondary | ICD-10-CM

## 2022-11-02 DIAGNOSIS — R0602 Shortness of breath: Secondary | ICD-10-CM | POA: Diagnosis not present

## 2022-11-02 DIAGNOSIS — E785 Hyperlipidemia, unspecified: Secondary | ICD-10-CM

## 2022-11-02 NOTE — Patient Instructions (Signed)
    Follow-Up: At [facility], you and your health needs are our priority.  As part of our continuing mission to provide you with exceptional heart care, we have created designated Provider Care Teams.  These Care Teams include your primary Cardiologist (physician) and Advanced Practice Providers (APPs -  Physician Assistants and Nurse Practitioners) who all work together to provide you with the care you need, when you need it.  We recommend signing up for the patient portal called "MyChart".  Sign up information is provided on this After Visit Summary.  MyChart is used to connect with patients for Virtual Visits (Telemedicine).  Patients are able to view lab/test results, encounter notes, upcoming appointments, etc.  Non-urgent messages can be sent to your provider as well.   To learn more about what you can do with MyChart, go to [email or web address].    Your next appointment:   10 month(s)  Provider:   ANY APP

## 2023-01-17 ENCOUNTER — Other Ambulatory Visit: Payer: Self-pay | Admitting: Internal Medicine

## 2023-02-07 ENCOUNTER — Encounter: Payer: Self-pay | Admitting: Internal Medicine

## 2023-02-07 NOTE — Patient Instructions (Addendum)
Blood work was ordered.       Medications changes include :   None      Return in about 1 year (around 02/08/2024) for Physical Exam.   Health Maintenance, Female Adopting a healthy lifestyle and getting preventive care are important in promoting health and wellness. Ask your health care provider about: The right schedule for you to have regular tests and exams. Things you can do on your own to prevent diseases and keep yourself healthy. What should I know about diet, weight, and exercise? Eat a healthy diet  Eat a diet that includes plenty of vegetables, fruits, low-fat dairy products, and lean protein. Do not eat a lot of foods that are high in solid fats, added sugars, or sodium. Maintain a healthy weight Body mass index (BMI) is used to identify weight problems. It estimates body fat based on height and weight. Your health care provider can help determine your BMI and help you achieve or maintain a healthy weight. Get regular exercise Get regular exercise. This is one of the most important things you can do for your health. Most adults should: Exercise for at least 150 minutes each week. The exercise should increase your heart rate and make you sweat (moderate-intensity exercise). Do strengthening exercises at least twice a week. This is in addition to the moderate-intensity exercise. Spend less time sitting. Even light physical activity can be beneficial. Watch cholesterol and blood lipids Have your blood tested for lipids and cholesterol at 76 years of age, then have this test every 5 years. Have your cholesterol levels checked more often if: Your lipid or cholesterol levels are high. You are older than 76 years of age. You are at high risk for heart disease. What should I know about cancer screening? Depending on your health history and family history, you may need to have cancer screening at various ages. This may include screening for: Breast cancer. Cervical  cancer. Colorectal cancer. Skin cancer. Lung cancer. What should I know about heart disease, diabetes, and high blood pressure? Blood pressure and heart disease High blood pressure causes heart disease and increases the risk of stroke. This is more likely to develop in people who have high blood pressure readings or are overweight. Have your blood pressure checked: Every 3-5 years if you are 66-80 years of age. Every year if you are 3 years old or older. Diabetes Have regular diabetes screenings. This checks your fasting blood sugar level. Have the screening done: Once every three years after age 84 if you are at a normal weight and have a low risk for diabetes. More often and at a younger age if you are overweight or have a high risk for diabetes. What should I know about preventing infection? Hepatitis B If you have a higher risk for hepatitis B, you should be screened for this virus. Talk with your health care provider to find out if you are at risk for hepatitis B infection. Hepatitis C Testing is recommended for: Everyone born from [birth year] through 1965. Anyone with known risk factors for hepatitis C. Sexually transmitted infections (STIs) Get screened for STIs, including gonorrhea and chlamydia, if: You are sexually active and are younger than 76 years of age. You are older than 76 years of age and your health care provider tells you that you are at risk for this type of infection. Your sexual activity has changed since you were last screened, and you are at increased risk for chlamydia or  gonorrhea. Ask your health care provider if you are at risk. Ask your health care provider about whether you are at high risk for HIV. Your health care provider may recommend a prescription medicine to help prevent HIV infection. If you choose to take medicine to prevent HIV, you should first get tested for HIV. You should then be tested every 3 months for as long as you are taking the  medicine. Pregnancy If you are about to stop having your period (premenopausal) and you may become pregnant, seek counseling before you get pregnant. Take 400 to 800 micrograms (mcg) of folic acid every day if you become pregnant. Ask for birth control (contraception) if you want to prevent pregnancy. Osteoporosis and menopause Osteoporosis is a disease in which the bones lose minerals and strength with aging. This can result in bone fractures. If you are 96 years old or older, or if you are at risk for osteoporosis and fractures, ask your health care provider if you should: Be screened for bone loss. Take a calcium or vitamin D supplement to lower your risk of fractures. Be given hormone replacement therapy (HRT) to treat symptoms of menopause. Follow these instructions at home: Alcohol use Do not drink alcohol if: Your health care provider tells you not to drink. You are pregnant, may be pregnant, or are planning to become pregnant. If you drink alcohol: Limit how much you have to: 0-1 drink a day. Know how much alcohol is in your drink. In the [place] one drink equals one 12 oz bottle of beer (355 mL), one 5 oz glass of wine (148 mL), or one 1 oz glass of hard liquor (44 mL). Lifestyle Do not use any products that contain nicotine or tobacco. These products include cigarettes, chewing tobacco, and vaping devices, such as e-cigarettes. If you need help quitting, ask your health care provider. Do not use street drugs. Do not share needles. Ask your health care provider for help if you need support or information about quitting drugs. General instructions Schedule regular health, dental, and eye exams. Stay current with your vaccines. Tell your health care provider if: You often feel depressed. You have ever been abused or do not feel safe at home. Summary Adopting a healthy lifestyle and getting preventive care are important in promoting health and wellness. Follow your health care  provider's instructions about healthy diet, exercising, and getting tested or screened for diseases. Follow your health care provider's instructions on monitoring your cholesterol and blood pressure. This information is not intended to replace advice given to you by your health care provider. Make sure you discuss any questions you have with your health care provider. Document Revised: 05/16/2020 Document Reviewed: 05/16/2020 Elsevier Patient Education  2024 [employer].

## 2023-02-07 NOTE — Progress Notes (Signed)
Subjective:    Patient ID: Sydney Robinson, female    DOB: March 10, 1945, 78 y.o.   MRN: [id number]      HPI Lela is here for a Physical exam and her chronic medical problems.    BP going up and down.  Has eaten some salty food intermittently.  She is trying to take the labetalol 12 hours apart and not closer together.  She just started monitoring more closely.    Medications and allergies reviewed with patient and updated if appropriate.  Current Outpatient Medications on File Prior to Visit  Medication Sig Dispense Refill   albuterol (VENTOLIN HFA) 108 (90 Base) MCG/ACT inhaler Inhale 2 puffs into the lungs every 6 (six) hours as needed for wheezing or shortness of breath. 18 each 5   Biotin 1000 MCG tablet as needed.     Cholecalciferol (VITAMIN D3 PO) Take 1 tablet by mouth as needed.      clonazePAM (KLONOPIN) 0.5 MG tablet TAKE 1/2-1 TABLET AT BEDTIME AS NEEDED 30 tablet 0   fexofenadine (ALLEGRA) 60 MG tablet Take 60 mg by mouth 2 (two) times daily.     labetalol (NORMODYNE) 200 MG tablet TAKE 1 TABLET BY MOUTH TWICE A DAY 180 tablet 1   polycarbophil (FIBERCON) 625 MG tablet Take 625 mg by mouth daily.     simvastatin (ZOCOR) 40 MG tablet TAKE 1 TABLET BY MOUTH EVERYDAY AT BEDTIME 90 tablet 3   No current facility-administered medications on file prior to visit.    Review of Systems  Constitutional:  Negative for fever.  Eyes:  Negative for visual disturbance.  Respiratory:  Positive for shortness of breath (? asthma per pulm). Negative for cough, chest tightness and wheezing.   Cardiovascular:  Positive for palpitations (1-2 beats on occasion). Negative for chest pain and leg swelling.  Gastrointestinal:  Negative for abdominal pain, blood in stool, constipation and diarrhea.       No gerd  Genitourinary:  Negative for dysuria.  Musculoskeletal:  Positive for arthralgias (right hip) and back pain (chronic).  Skin:  Negative for rash.  Neurological:  Positive for  light-headedness (getting up quick). Negative for headaches.  Psychiatric/Behavioral:  Negative for dysphoric mood. The patient is not nervous/anxious.        Objective:   Vitals:   02/08/23 0951  BP: 138/80  Pulse: 64  Temp: 98.1 F (36.7 C)  SpO2: 99%   Filed Weights   02/08/23 0951  Weight: 202 lb (91.6 kg)   Body mass index is 31.64 kg/m.  BP Readings from Last 3 Encounters:  02/08/23 138/80  11/02/22 (!) 140/88  09/03/22 104/70    Wt Readings from Last 3 Encounters:  02/08/23 202 lb (91.6 kg)  11/02/22 202 lb 9.6 oz (91.9 kg)  09/03/22 206 lb (93.4 kg)       Physical Exam Constitutional: She appears well-developed and well-nourished. No distress.  HENT:  Head: Normocephalic and atraumatic.  Right Ear: External ear normal. Normal ear canal and TM Left Ear: External ear normal.  Normal ear canal and TM Mouth/Throat: Oropharynx is clear and moist.  Eyes: Conjunctivae normal.  Neck: Neck supple. No tracheal deviation present. No thyromegaly present.  No carotid bruit  Cardiovascular: Normal rate, regular rhythm and normal heart sounds.   No murmur heard.  No edema. Pulmonary/Chest: Effort normal and breath sounds normal. No respiratory distress. She has no wheezes. She has no rales.  Breast: deferred   Abdominal: Soft. She exhibits no distension.  There is no tenderness.  Lymphadenopathy: She has no cervical adenopathy.  Skin: Skin is warm and dry. She is not diaphoretic.  Psychiatric: She has a normal mood and affect. Her behavior is normal.     Lab Results  Component Value Date   WBC 4.8 02/06/2022   HGB 14.0 02/06/2022   HCT 40.8 02/06/2022   PLT 228.0 02/06/2022   GLUCOSE 96 02/06/2022   CHOL 186 02/06/2022   TRIG 104.0 02/06/2022   HDL 75.30 02/06/2022   LDLDIRECT 130.5 12/29/2012   LDLCALC 90 02/06/2022   ALT 7 02/06/2022   AST 11 02/06/2022   NA 142 02/06/2022   K 4.2 02/06/2022   CL 106 02/06/2022   CREATININE 0.94 02/06/2022   BUN 10  02/06/2022   CO2 29 02/06/2022   TSH 2.01 02/06/2022   HGBA1C 5.5 02/06/2022   MICROALBUR <0.7 02/06/2022         Assessment & Plan:   Physical exam: Screening blood work  ordered Exercise  regular - 2-3 times a week - [facility]-working on weight loss.  Discussed the more she exercises it will help and decreasing portions, healthy diet Substance abuse  none   Reviewed recommended immunizations.   Health Maintenance  Topic Date Due   DTaP/Tdap/Td (2 - Tdap) 07/30/2018   Medicare Annual Wellness (AWV)  02/17/2021   COVID-19 Vaccine (6 - 2024-25 season) 09/09/2022   DEXA SCAN  03/02/2025   Colonoscopy  07/19/2026   Pneumonia Vaccine 53+ Years old  Completed   INFLUENZA VACCINE  Completed   Hepatitis C Screening  Completed   HPV VACCINES  Aged Out   Zoster Vaccines- Shingrix  Discontinued          See Problem List for Assessment and Plan of chronic medical problems.

## 2023-02-08 ENCOUNTER — Ambulatory Visit (INDEPENDENT_AMBULATORY_CARE_PROVIDER_SITE_OTHER): Payer: Medicare Other | Admitting: Internal Medicine

## 2023-02-08 VITALS — BP 132/80 | HR 64 | Temp 98.1°F | Ht 67.0 in | Wt 202.0 lb

## 2023-02-08 DIAGNOSIS — R7303 Prediabetes: Secondary | ICD-10-CM

## 2023-02-08 DIAGNOSIS — I10 Essential (primary) hypertension: Secondary | ICD-10-CM | POA: Diagnosis not present

## 2023-02-08 DIAGNOSIS — R944 Abnormal results of kidney function studies: Secondary | ICD-10-CM

## 2023-02-08 DIAGNOSIS — E66811 Obesity, class 1: Secondary | ICD-10-CM

## 2023-02-08 DIAGNOSIS — G4733 Obstructive sleep apnea (adult) (pediatric): Secondary | ICD-10-CM

## 2023-02-08 DIAGNOSIS — E7849 Other hyperlipidemia: Secondary | ICD-10-CM

## 2023-02-08 DIAGNOSIS — G4709 Other insomnia: Secondary | ICD-10-CM

## 2023-02-08 DIAGNOSIS — E6609 Other obesity due to excess calories: Secondary | ICD-10-CM

## 2023-02-08 DIAGNOSIS — Z0000 Encounter for general adult medical examination without abnormal findings: Secondary | ICD-10-CM

## 2023-02-08 DIAGNOSIS — Z6830 Body mass index (BMI) 30.0-30.9, adult: Secondary | ICD-10-CM

## 2023-02-08 LAB — CBC WITH DIFFERENTIAL/PLATELET
Basophils Absolute: 0 10*3/uL (ref 0.0–0.1)
Basophils Relative: 0.4 % (ref 0.0–3.0)
Eosinophils Absolute: 0.1 10*3/uL (ref 0.0–0.7)
Eosinophils Relative: 1.5 % (ref 0.0–5.0)
HCT: 40.3 % (ref 36.0–46.0)
Hemoglobin: 13.4 g/dL (ref 12.0–15.0)
Lymphocytes Relative: 36.5 % (ref 12.0–46.0)
Lymphs Abs: 1.9 10*3/uL (ref 0.7–4.0)
MCHC: 33.2 g/dL (ref 30.0–36.0)
MCV: 88.7 fL (ref 78.0–100.0)
Monocytes Absolute: 0.4 10*3/uL (ref 0.1–1.0)
Monocytes Relative: 7.9 % (ref 3.0–12.0)
Neutro Abs: 2.8 10*3/uL (ref 1.4–7.7)
Neutrophils Relative %: 53.7 % (ref 43.0–77.0)
Platelets: 225 10*3/uL (ref 150.0–400.0)
RBC: 4.54 Mil/uL (ref 3.87–5.11)
RDW: 13.5 % (ref 11.5–15.5)
WBC: 5.2 10*3/uL (ref 4.0–10.5)

## 2023-02-08 LAB — COMPREHENSIVE METABOLIC PANEL
ALT: 9 U/L (ref 0–35)
AST: 15 U/L (ref 0–37)
Albumin: 4.4 g/dL (ref 3.5–5.2)
Alkaline Phosphatase: 57 U/L (ref 39–117)
BUN: 9 mg/dL (ref 6–23)
CO2: 30 meq/L (ref 19–32)
Calcium: 9.2 mg/dL (ref 8.4–10.5)
Chloride: 108 meq/L (ref 96–112)
Creatinine, Ser: 0.9 mg/dL (ref 0.40–1.20)
GFR: 62.29 mL/min (ref >60.00)
Glucose, Bld: 98 mg/dL (ref 70–99)
Potassium: 4.1 meq/L (ref 3.5–5.1)
Sodium: 144 meq/L (ref 135–145)
Total Bilirubin: 0.9 mg/dL (ref 0.2–1.2)
Total Protein: 6.5 g/dL (ref 6.0–8.3)

## 2023-02-08 LAB — LIPID PANEL
Cholesterol: 178 mg/dL (ref 0–200)
HDL: 65.6 mg/dL (ref >39.00)
LDL Cholesterol: 89 mg/dL (ref 0–99)
NonHDL: 112.45
Total CHOL/HDL Ratio: 3
Triglycerides: 116 mg/dL (ref 0.0–149.0)
VLDL: 23.2 mg/dL (ref 0.0–40.0)

## 2023-02-08 LAB — TSH: TSH: 1.83 u[IU]/mL (ref 0.35–5.50)

## 2023-02-08 LAB — HEMOGLOBIN A1C: Hgb A1c MFr Bld: 5.7 % (ref 4.6–6.5)

## 2023-02-08 NOTE — Assessment & Plan Note (Signed)
Chronic  wearing cpap nightly

## 2023-02-08 NOTE — Assessment & Plan Note (Signed)
Chronic Regular exercise and healthy diet encouraged Check lipid panel, cmp, tsh Continue simvastatin 40 mg daily

## 2023-02-08 NOTE — Assessment & Plan Note (Addendum)
Chronic Blood pressure controlled-better on repeat Has been variable at home and she will monitor it more closely over the next couple of weeks If her blood pressure continues to be elevated at times she will let me know so we can adjust medication Continue regular exercise-increase if possible Healthy diet, decrease portions Continue Lopressor labetalol 200 mg twice daily CBC, CMP

## 2023-02-08 NOTE — Assessment & Plan Note (Signed)
Chronic Intermittent Continue clonazepam 0.25-0.5 mg bedtime as needed-she does not take this often

## 2023-02-08 NOTE — Assessment & Plan Note (Addendum)
Chronic She is working on [employer] loss Continue regular exercise-increase ideally Healthy diet, decrease portions

## 2023-02-08 NOTE — Assessment & Plan Note (Signed)
Chronic Check a1c Low sugar / carb diet Stressed regular exercise  Lab Results  Component Value Date   HGBA1C 5.5 02/06/2022

## 2023-02-08 NOTE — Assessment & Plan Note (Signed)
GFR decreased the last 2 times with blood work Reviewed with patient Avoid NSAIDs (which she does not take), increase water intake Low-salt diet Work on weight loss Stressed blood pressure and sugar control CMP, cbc

## 2023-02-10 ENCOUNTER — Encounter: Payer: Self-pay | Admitting: Internal Medicine

## 2023-02-12 ENCOUNTER — Encounter: Payer: Self-pay | Admitting: Cardiology

## 2023-02-23 ENCOUNTER — Other Ambulatory Visit: Payer: Self-pay | Admitting: Internal Medicine

## 2023-03-14 ENCOUNTER — Other Ambulatory Visit: Payer: Self-pay | Admitting: Internal Medicine

## 2023-03-14 DIAGNOSIS — Z1231 Encounter for screening mammogram for malignant neoplasm of breast: Secondary | ICD-10-CM

## 2023-03-15 ENCOUNTER — Ambulatory Visit: Admitting: Internal Medicine

## 2023-03-20 ENCOUNTER — Ambulatory Visit (INDEPENDENT_AMBULATORY_CARE_PROVIDER_SITE_OTHER): Admitting: Internal Medicine

## 2023-03-20 ENCOUNTER — Encounter: Payer: Self-pay | Admitting: Internal Medicine

## 2023-03-20 VITALS — BP 126/80 | HR 80 | Temp 98.0°F | Ht 67.0 in | Wt 202.0 lb

## 2023-03-20 DIAGNOSIS — I10 Essential (primary) hypertension: Secondary | ICD-10-CM | POA: Diagnosis not present

## 2023-03-20 MED ORDER — LOSARTAN POTASSIUM 50 MG PO TABS: 50.0000 mg | ORAL_TABLET | Freq: Every day | ORAL | 5 refills | Status: DC

## 2023-03-20 NOTE — Assessment & Plan Note (Addendum)
 Chronic Blood pressure controlled here but has been low at times at home and she feels lightheadedness wth low BPs - has had several episodes  Stop Lopressor labetalol 200 mg twice daily Start losartan 50 mg daily Monitor BP at home BMP in a few weeks

## 2023-03-20 NOTE — Patient Instructions (Addendum)
       Medications changes include :   stop labetalol and start losartan 50 mg daily   In 4-5 weeks have blood work done in our lab to check you potassium and kidney function.    Goal BP < 130/80

## 2023-03-20 NOTE — Progress Notes (Signed)
 Subjective:    Patient ID: Sydney Robinson, female    DOB: Sep 06, 1945, 78 y.o.   MRN: [id number]      HPI Ariea is here for  Chief Complaint  Patient presents with   Medical Management of Chronic Issues    Last week of feeling light-headed was last week    She was feeling lightheaded last week - this is not new - feels it some days for no reason.  Her BP is low at the times when she feel lightheaded.    Has been 98/?  Or 108/?  105/68, 120/63, 96/58, 92/62  When her SBP is less than 98 is when she usually feels lightheaded.   Medications and allergies reviewed with patient and updated if appropriate.  Current Outpatient Medications on File Prior to Visit  Medication Sig Dispense Refill   albuterol (VENTOLIN HFA) 108 (90 Base) MCG/ACT inhaler Inhale 2 puffs into the lungs every 6 (six) hours as needed for wheezing or shortness of breath. 18 each 5   Biotin 1000 MCG tablet as needed.     Cholecalciferol (VITAMIN D3 PO) Take 1 tablet by mouth as needed.      clonazePAM (KLONOPIN) 0.5 MG tablet TAKE 1/2-1 TABLET AT BEDTIME AS NEEDED 30 tablet 0   fexofenadine (ALLEGRA) 60 MG tablet Take 60 mg by mouth 2 (two) times daily.     labetalol (NORMODYNE) 200 MG tablet TAKE 1 TABLET BY MOUTH TWICE A DAY 180 tablet 1   polycarbophil (FIBERCON) 625 MG tablet Take 625 mg by mouth daily.     simvastatin (ZOCOR) 40 MG tablet TAKE 1 TABLET BY MOUTH EVERYDAY AT BEDTIME 90 tablet 3   No current facility-administered medications on file prior to visit.    Review of Systems  Respiratory:  Positive for shortness of breath (a little).   Cardiovascular:  Negative for chest pain, palpitations and leg swelling.  Neurological:  Positive for light-headedness. Negative for headaches.       Objective:   Vitals:   03/20/23 1346  BP: 126/80  Pulse: 80  Temp: 98 F (36.7 C)  SpO2: 98%   BP Readings from Last 3 Encounters:  03/20/23 126/80  02/08/23 132/80  11/02/22 (!) 140/88   Wt  Readings from Last 3 Encounters:  03/20/23 202 lb (91.6 kg)  02/08/23 202 lb (91.6 kg)  11/02/22 202 lb 9.6 oz (91.9 kg)   Body mass index is 31.64 kg/m.    Physical Exam Constitutional:      General: She is not in acute distress.    Appearance: Normal appearance.  HENT:     Head: Normocephalic and atraumatic.  Eyes:     Conjunctiva/sclera: Conjunctivae normal.  Cardiovascular:     Rate and Rhythm: Normal rate and regular rhythm.     Heart sounds: Normal heart sounds.  Pulmonary:     Effort: Pulmonary effort is normal. No respiratory distress.     Breath sounds: Normal breath sounds. No wheezing.  Musculoskeletal:     Cervical back: Neck supple.     Right lower leg: No edema.     Left lower leg: No edema.  Lymphadenopathy:     Cervical: No cervical adenopathy.  Skin:    General: Skin is warm and dry.     Findings: No rash.  Neurological:     Mental Status: She is alert. Mental status is at baseline.  Psychiatric:        Mood and Affect: Mood normal.  Behavior: Behavior normal.            Assessment & Plan:    See Problem List for Assessment and Plan of chronic medical problems.

## 2023-03-24 ENCOUNTER — Encounter: Payer: Self-pay | Admitting: Internal Medicine

## 2023-03-25 ENCOUNTER — Encounter: Payer: Self-pay | Admitting: Internal Medicine

## 2023-03-26 ENCOUNTER — Ambulatory Visit
Admission: RE | Admit: 2023-03-26 | Discharge: 2023-03-26 | Disposition: A | Discharge status: Home / Self Care | Source: Ambulatory Visit | Attending: Internal Medicine

## 2023-03-26 DIAGNOSIS — Z1231 Encounter for screening mammogram for malignant neoplasm of breast: Secondary | ICD-10-CM

## 2023-03-26 MED ORDER — LOSARTAN POTASSIUM 50 MG PO TABS: 100.0000 mg | ORAL_TABLET | Freq: Every day | ORAL | Status: DC

## 2023-04-24 ENCOUNTER — Other Ambulatory Visit (INDEPENDENT_AMBULATORY_CARE_PROVIDER_SITE_OTHER)

## 2023-04-24 ENCOUNTER — Encounter: Payer: Self-pay | Admitting: Internal Medicine

## 2023-04-24 DIAGNOSIS — I10 Essential (primary) hypertension: Secondary | ICD-10-CM | POA: Diagnosis not present

## 2023-04-24 LAB — BASIC METABOLIC PANEL WITH GFR
BUN: 12 mg/dL (ref 6–23)
CO2: 26 meq/L (ref 19–32)
Calcium: 9.6 mg/dL (ref 8.4–10.5)
Chloride: 105 meq/L (ref 96–112)
Creatinine, Ser: 0.97 mg/dL (ref 0.40–1.20)
GFR: 56.85 mL/min — ABNORMAL LOW (ref >60.00)
Glucose, Bld: 107 mg/dL — ABNORMAL HIGH (ref 70–99)
Potassium: 4.2 meq/L (ref 3.5–5.1)
Sodium: 140 meq/L (ref 135–145)

## 2023-05-22 ENCOUNTER — Other Ambulatory Visit: Payer: Self-pay

## 2023-05-22 ENCOUNTER — Other Ambulatory Visit: Payer: Self-pay | Admitting: Internal Medicine

## 2023-05-22 MED ORDER — LOSARTAN POTASSIUM 100 MG PO TABS
100.0000 mg | ORAL_TABLET | Freq: Every day | ORAL | 1 refills | Status: DC
  Filled 2023-05-22: qty 90, 90d supply, fill #0

## 2023-05-22 NOTE — Telephone Encounter (Signed)
Changed to 100mg  daily

## 2023-05-27 ENCOUNTER — Other Ambulatory Visit: Payer: Self-pay

## 2023-10-03 DIAGNOSIS — R55 Syncope and collapse: Secondary | ICD-10-CM | POA: Insufficient documentation

## 2023-10-03 NOTE — Progress Notes (Unsigned)
  Cardiology Office Note:   Date:  10/04/2023  ID:  Sydney Robinson, DOB 1945/05/22, MRN [id number] PCP: Geofm Glade PARAS, MD   [facility] Providers Cardiologist:  Lynwood Schilling, MD {  History of Present Illness:   Sydney Robinson is a 78 y.o. female who presents for evaluation of SOB.  She was seen by Dr. Raford.   She had a negative Lexiscan  Myoview .  Echo showed a normal EF and mild diastolic dysfunction.    She had some mild sleep apnea diagnosed.  She uses CPAP some of the time.    She was using labetalol  but had dizziness with this.  Her primary put her on Cozaar  100 mg and she was concerned about this because she does have some renal insufficiency.  However, she also says that she tolerates this.  She feels better on this and her blood pressures are well-controlled at home.  She is riding the exercise bike.  She does some gardening and some walking. The patient denies any new symptoms such as chest discomfort, neck or arm discomfort. There has been no new shortness of breath, PND or orthopnea. There have been no reported palpitations, presyncope or syncope.     Of note she does not use her CPAP regularly because she sleeps poorly and sleeps on her side.  She wears it occasionally.  ROS: As stated in the HPI and negative for all other systems.  Studies Reviewed:    EKG:   EKG Interpretation Date/Time:  Friday October 04 2023 10:34:30 EDT Ventricular Rate:  69 PR Interval:  164 QRS Duration:  84 QT Interval:  404 QTC Calculation: 432 R Axis:   17  Text Interpretation: Normal sinus rhythm When compared with ECG of 02-Nov-2022 12:26, No significant change was found Confirmed by Schilling Lynwood ([id number]) on 10/04/2023 10:38:06 AM    Risk Assessment/Calculations:              Physical Exam:   VS:  BP 120/77 (BP Location: Right Arm, Patient Position: Sitting, Cuff Size: Large)   Pulse 77   Ht 5' 7 (1.702 m)   Wt 193 lb (87.5 kg)   SpO2 98%   BMI 30.23 kg/m    Wt  Readings from Last 3 Encounters:  10/04/23 193 lb (87.5 kg)  03/20/23 202 lb (91.6 kg)  02/08/23 202 lb (91.6 kg)     GEN: Well nourished, well developed in no acute distress NECK: No JVD; No carotid bruits CARDIAC: RRR, no murmurs, rubs, gallops RESPIRATORY:  Clear to auscultation without rales, wheezing or rhonchi  ABDOMEN: Soft, non-tender, non-distended EXTREMITIES:  No edema; No deformity   ASSESSMENT AND PLAN:   SOB: She has some chronic dyspnea but it is no different than when she had her perfusion study a few years ago.  She is not having any chest discomfort or other anginal symptoms.  No further workup is suggested unless she has a change in her symptoms.   HTN: Her blood pressure is well-controlled and she is tolerating the losartan .  We went over this and no she does have some mild renal insufficiency this is really only mild and I think the Cozaar  is an excellent choice.  No change in therapy.     SLEEP APNEA: I asked her to talk with her pulmonologist about possibly an oral appliance since she cannot really use the CPAP.  Follow up with me as needed.  Signed, Lynwood Schilling, MD

## 2023-10-04 ENCOUNTER — Encounter: Payer: Self-pay | Admitting: Cardiology

## 2023-10-04 ENCOUNTER — Ambulatory Visit: Attending: Cardiology | Admitting: Cardiology

## 2023-10-04 VITALS — BP 120/77 | HR 77 | Ht 67.0 in | Wt 193.0 lb

## 2023-10-04 DIAGNOSIS — R0602 Shortness of breath: Secondary | ICD-10-CM

## 2023-10-04 DIAGNOSIS — R55 Syncope and collapse: Secondary | ICD-10-CM

## 2023-10-04 DIAGNOSIS — I10 Essential (primary) hypertension: Secondary | ICD-10-CM

## 2023-10-04 NOTE — Patient Instructions (Signed)
 Medication Instructions:  Continue same medications  Lab Work: None ordered  Testing/Procedures: None ordered  Follow-Up: At [facility], you and your health needs are our priority.  As part of our continuing mission to provide you with exceptional heart care, our providers are all part of one team.  This team includes your primary Cardiologist (physician) and Advanced Practice Providers or APPs (Physician Assistants and Nurse Practitioners) who all work together to provide you with the care you need, when you need it.  Your next appointment:   As Needed    Provider:  Dr.Hochrein    We recommend signing up for the patient portal called MyChart.  Sign up information is provided on this After Visit Summary.  MyChart is used to connect with patients for Virtual Visits (Telemedicine).  Patients are able to view lab/test results, encounter notes, upcoming appointments, etc.  Non-urgent messages can be sent to your provider as well.   To learn more about what you can do with MyChart, go to [email or web address].

## 2023-11-13 ENCOUNTER — Other Ambulatory Visit: Payer: Self-pay | Admitting: Internal Medicine

## 2023-11-18 ENCOUNTER — Encounter: Payer: Self-pay | Admitting: Internal Medicine

## 2023-11-18 ENCOUNTER — Ambulatory Visit: Admitting: Internal Medicine

## 2023-11-18 VITALS — BP 126/71 | HR 79 | Temp 98.5°F | Ht 67.0 in | Wt 194.6 lb

## 2023-11-18 DIAGNOSIS — G4733 Obstructive sleep apnea (adult) (pediatric): Secondary | ICD-10-CM

## 2023-11-18 DIAGNOSIS — J302 Other seasonal allergic rhinitis: Secondary | ICD-10-CM | POA: Diagnosis not present

## 2023-11-18 DIAGNOSIS — R0602 Shortness of breath: Secondary | ICD-10-CM | POA: Diagnosis not present

## 2023-11-18 MED ORDER — FLUTICASONE PROPIONATE 50 MCG/ACT NA SUSP
1.0000 | Freq: Every day | NASAL | 5 refills | Status: AC
Start: 2023-11-18 — End: ?

## 2023-11-18 NOTE — Patient Instructions (Addendum)
 It was a pleasure to see you today!  Please schedule follow up with Dr Theodoro in 4 months.  If my schedule is not open yet, we will contact you with a reminder closer to that time. Please call [phone] if you haven't heard from us  a month before, and always call us  sooner if issues or concerns arise. You can also send us  a message through MyChart, but but aware that this is not to be used for urgent issues and it may take up to 5-7 days to receive a reply. Please be aware that you will likely be able to view your results before I have a chance to respond to them. Please give us  5 business days to respond to any non-urgent results.    Before your next visit I would like you to have: Full set of PFTs  Sorry to hear you are still having a lot of leak.  I have put in an order for a chinstrap and a mask fitting.  Please contact [employer] to make sure that these get scheduled.  Otherwise your sleep apnea appears to be well-controlled based on the download I reviewed today.    I am glad the exercise is going well.  Continue to use the albuterol  as needed prior to exercise.  Since you are still having some shortness of breath lets repeat a pulmonary function test to make sure there is been no change over 4 years.  Your lung exam is very reassuring today.    For the nasal drainage likely related to allergy symptoms start Flonase nasal spray.  Flonase - 1 spray on each side of your nose twice a day for first week, then 1 spray on each side.   Instructions for use: If you also use a saline nasal spray or rinse, use that first. Position the head with the chin slightly tucked. Use the right hand to spray into the left nostril and the right hand to spray into the left nostril.   Point the bottle away from the septum of your nose (cartilage that divides the two sides of your nose).  Hold the nostril closed on the opposite side from where you will spray Spray once and gently sniff to pull the medicine into the  higher parts of your nose.  Don't sniff too hard as the medicine will drain down the back of your throat instead. Repeat with a second spray on the same side if prescribed. Repeat on the other side of your nose.

## 2023-11-18 NOTE — Progress Notes (Signed)
 Sydney Robinson    [id number]    Aug 02, 1945  Primary Care Physician:Burns, Glade PARAS, MD Date of Appointment: 11/18/2023 Established Patient Visit  Chief complaint:   Chief Complaint  Patient presents with   Medical Management of Chronic Issues    F/U [facility]     HPI: Sydney Robinson is a 78 y.o. with shortness of breath and OSA who presents for follow up.    Interval Updates: Here for OSA follow up.   Feeling well. Reports good improvement from the CPAP in terms of quality of sleep.  CPAP download reviewed: 80% adherence Auto cpap 5-15 cm H20 EPR 2, median pressure 8.5 cm H20, AHI 3.4, continues to have substantial leak despite change in mask  She said she is still using nasal pillows but no one contacted her for a mask fitting. She doesn't feel like she is sleeping as well as she could. She does not use a chin strap. She did not ask for one.   She is having some sinus drainage which is new. She does have environmental allergies.  No headaches.   Goes to the [facility] 2-3 times/week to exercise. Uses albuterol  prior to exercise. This does help her.   I have reviewed the patient's family social and past medical history and updated as appropriate.   Past Medical History:  Diagnosis Date   Asthma    Colon polyps 1999   adenomatous   Diverticulosis    GERD (gastroesophageal reflux disease)    Heart murmur    Hyperlipidemia    Hypertension    Sleep apnea     Past Surgical History:  Procedure Laterality Date   BREAST EXCISIONAL BIOPSY Bilateral 1970s   CATARACT EXTRACTION Right    COLONOSCOPY W/ POLYPECTOMY  1998    negative 2003 & 2008; 2013   DILATION AND CURETTAGE OF UTERUS     x 3   fibroidectomy     x3   g2 p2     hemorrhoidal banding  2011   Dr Luis   Neuroma Resected Foot     Right    OVARIAN CYST REMOVAL     ROTATOR CUFF REPAIR     TONSILLECTOMY      Family History  Problem Relation Age of Onset   Diabetes Mother    Alzheimer's disease  Mother    Heart failure Mother    Glaucoma Mother    Arthritis Mother    Colon polyps Sister    Breast cancer Sister 40   Heart disease Sister    Bladder Cancer Sister    Diverticulitis Sister    Breast cancer Sister 47   Glaucoma Sister    Lung cancer Sister    Heart attack Maternal Aunt 59   Diabetes Maternal Aunt    Diabetes Maternal Uncle    Heart attack Maternal Uncle    Stroke Paternal Aunt 29       cns aneurysm   Uterine cancer Paternal Aunt    Diabetes Paternal Aunt    Diabetes Maternal Grandmother    Stroke Maternal Grandfather    Diabetes Other        MGGM   Colon cancer Other        cousin   Esophageal cancer Neg Hx    Stomach cancer Neg Hx    Rectal cancer Neg Hx     Social History   Occupational History   Not on file  Tobacco Use  Smoking status: Never   Smokeless tobacco: Never  Vaping Use   Vaping status: Never Used  Substance and Sexual Activity   Alcohol use: No    Alcohol/week: 0.0 standard drinks of alcohol    Comment: Wine rarely   Drug use: No   Sexual activity: Not on file     Physical Exam: Blood pressure 126/71, pulse 79, temperature 98.5 F (36.9 C), temperature source Oral, height 5' 7 (1.702 m), weight 194 lb 9.6 oz (88.3 kg), SpO2 99%.  Gen:      No acute distress Lungs:   ctab no wheezes or crackles CV:        RRR no mrg   Data Reviewed: Imaging: I have personally reviewed the chest xray April 2021. No acute process. Relatively unchanged from chest xray 2013  PFTs:      Latest Ref Rng & Units 08/21/2019   12:50 PM  PFT Results  FVC-Pre L 2.46   FVC-Predicted Pre % 92   FVC-Post L 2.53   FVC-Predicted Post % 95   Pre FEV1/FVC % % 81   Post FEV1/FCV % % 85   FEV1-Pre L 1.99   FEV1-Predicted Pre % 96   FEV1-Post L 2.14   DLCO uncorrected ml/min/mmHg 18.24   DLCO UNC% % 85   DLCO corrected ml/min/mmHg 18.24   DLCO COR %Predicted % 85   DLVA Predicted % 111   TLC L 4.22   TLC % Predicted % 76   RV %  Predicted % 77    I have personally reviewed the patient's PFTs and there is no airflow limitation and mild restriction to ventilation likely secondary to body habitus.  Echocardiogram 05/20/2019  1. Left ventricular ejection fraction, by estimation, is 60 to 65%. The  left ventricle has normal function. The left ventricle has no regional  wall motion abnormalities. Left ventricular diastolic parameters are  consistent with Grade I diastolic  dysfunction (impaired relaxation).   2. Right ventricular systolic function is normal. The right ventricular  size is normal. Tricuspid regurgitation signal is inadequate for assessing  PA pressure.   3. The mitral valve is abnormal. Trivial mitral valve regurgitation.   4. The aortic valve is tricuspid. Aortic valve regurgitation is not  visualized. Mild aortic valve sclerosis is present, with no evidence of  aortic valve stenosis.   5. Aortic dilatation noted. 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   6. The inferior vena cava is normal in size with greater than 50%  respiratory variability, suggesting right atrial pressure of 3 mmHg.  Labs: Lab Results  Component Value Date   WBC 5.2 02/08/2023   HGB 13.4 02/08/2023   HCT 40.3 02/08/2023   MCV 88.7 02/08/2023   PLT 225.0 02/08/2023   Lab Results  Component Value Date   NA 140 04/24/2023   K 4.2 04/24/2023   CL 105 04/24/2023   CO2 26 04/24/2023    Immunization status: Immunization History  Administered Date(s) Administered   Fluad Quad(high Dose 65+) 08/27/2020   INFLUENZA, HIGH DOSE SEASONAL PF 11/01/2014, 10/08/2017, 08/26/2018   Influenza Split 11/15/2010   Influenza Whole 01/08/2001, 10/08/2007   Influenza, Seasonal, Injecte, Preservative Fre 12/03/2011   Influenza,inj,Quad PF,6+ Mos 10/13/2013   Influenza-Unspecified 10/14/2012, 11/01/2014, 10/05/2015, 10/08/2016, 08/20/2018, 08/26/2019, 09/14/2021, 09/18/2022   PFIZER(Purple Top)SARS-COV-2  Vaccination 02/03/2019, 03/06/2019, 10/05/2019, 04/10/2020   Pfizer Covid-19 Vaccine Bivalent Booster 54yrs & up 11/09/2020   Pneumococcal Conjugate-13 12/14/2014   Pneumococcal Polysaccharide-23 12/23/2015  Td 07/29/2008    Assessment:  Shortness of Breath with mild restriction to ventilation OSA on CPAP, controlled Allergic Rhinitis   Plan/Recommendations: Cpap download reviewed. Excellent adherence. Improved symptoms of OSA. However having more leak. Will send in order for mask of best fit.  Continue albuterol  as needed - take before exercise/exertion.    Return to Care: Return in about 4 months (around 03/17/2024) for Dr Theodoro.   Verdon Gore, MD Pulmonary and Critical Care Medicine [facility] Office:[phone]

## 2023-11-19 ENCOUNTER — Ambulatory Visit

## 2023-11-19 DIAGNOSIS — R0602 Shortness of breath: Secondary | ICD-10-CM | POA: Diagnosis not present

## 2023-11-19 LAB — PULMONARY FUNCTION TEST
DL/VA % pred: 100 %
DL/VA: 4.04 ml/min/mmHg/L
DLCO unc % pred: 78 %
DLCO unc: 16.47 ml/min/mmHg
FEF 25-75 Post: 3.29 L/s
FEF 25-75 Pre: 1.68 L/s
FEF2575-%Change-Post: 95 %
FEF2575-%Pred-Post: 183 %
FEF2575-%Pred-Pre: 93 %
FEV1-%Change-Post: 11 %
FEV1-%Pred-Post: 90 %
FEV1-%Pred-Pre: 81 %
FEV1-Post: 2.14 L
FEV1-Pre: 1.92 L
FEV1FVC-%Change-Post: 6 %
FEV1FVC-%Pred-Pre: 106 %
FEV6-%Change-Post: 4 %
FEV6-%Pred-Post: 84 %
FEV6-%Pred-Pre: 80 %
FEV6-Post: 2.53 L
FEV6-Pre: 2.42 L
FEV6FVC-%Change-Post: 0 %
FEV6FVC-%Pred-Post: 104 %
FEV6FVC-%Pred-Pre: 104 %
FVC-%Change-Post: 4 %
FVC-%Pred-Post: 80 %
FVC-%Pred-Pre: 77 %
FVC-Post: 2.54 L
FVC-Pre: 2.43 L
Post FEV1/FVC ratio: 84 %
Post FEV6/FVC ratio: 100 %
Pre FEV1/FVC ratio: 79 %
Pre FEV6/FVC Ratio: 100 %
RV % pred: 81 %
RV: 2.01 L
TLC % pred: 81 %
TLC: 4.46 L

## 2023-11-19 NOTE — Progress Notes (Signed)
 Full pft performed today

## 2023-11-19 NOTE — Patient Instructions (Signed)
 Full pft performed today

## 2023-12-03 ENCOUNTER — Ambulatory Visit

## 2023-12-03 VITALS — BP 116/82 | HR 74 | Temp 98.3°F | Ht 67.0 in | Wt 194.4 lb

## 2023-12-03 DIAGNOSIS — Z6830 Body mass index (BMI) 30.0-30.9, adult: Secondary | ICD-10-CM

## 2023-12-03 DIAGNOSIS — G4733 Obstructive sleep apnea (adult) (pediatric): Secondary | ICD-10-CM | POA: Diagnosis not present

## 2023-12-03 DIAGNOSIS — J45990 Exercise induced bronchospasm: Secondary | ICD-10-CM

## 2023-12-03 DIAGNOSIS — J301 Allergic rhinitis due to pollen: Secondary | ICD-10-CM | POA: Diagnosis not present

## 2023-12-03 DIAGNOSIS — E66811 Obesity, class 1: Secondary | ICD-10-CM

## 2023-12-03 MED ORDER — MONTELUKAST SODIUM 10 MG PO TABS
10.0000 mg | ORAL_TABLET | Freq: Every day | ORAL | 5 refills | Status: AC
Start: 2023-12-03 — End: ?

## 2023-12-03 NOTE — Assessment & Plan Note (Addendum)
 SABRA

## 2023-12-03 NOTE — Addendum Note (Signed)
 Addended byBETHA JESSICA BOUCHARD S on: 12/03/2023 02:34 PM   Modules accepted: Orders

## 2023-12-03 NOTE — Patient Instructions (Signed)
  VISIT SUMMARY: You came in today for a follow-up visit regarding your sleep apnea and other respiratory concerns. We discussed your current treatment and made some adjustments to help improve your symptoms and comfort.  YOUR PLAN: -OBSTRUCTIVE SLEEP APNEA: Obstructive sleep apnea is a condition where your airway becomes blocked during sleep, causing breathing pauses. To address the air leakage from your nasal pillows, we have ordered a chin strap and requested a mask fitting with the DME company. Please continue using your CPAP machine consistently, including when you first go to bed.  -EXERCISE-INDUCED BRONCHOSPASM: Exercise-induced bronchospasm is a condition where physical activity causes shortness of breath. We have prescribed Singulair  to see if it helps with your symptoms during exercise. Continue using your albuterol  inhaler as needed.  -ALLERGIC RHINITIS DUE TO POLLEN: Allergic rhinitis is an allergic reaction that causes nasal congestion and post-nasal drip. Your symptoms have improved with Flonase , so please continue using it. You can also use saline nasal spray as needed.  INSTRUCTIONS: Please follow up with the DME company for your mask fitting. Continue using your CPAP machine, Singulair , and Flonase  as directed. If you have any new or worsening symptoms, please contact our office.                      Contains text generated by Abridge.                                 Contains text generated by Abridge.

## 2023-12-03 NOTE — Progress Notes (Signed)
 Pulmonology Office Visit   Subjective:  Patient ID: Sydney Robinson, female    DOB: January 27, 1945  MRN: [id number]  Referred by: Geofm Glade PARAS, MD  CC:  Chief Complaint  Patient presents with   Shortness of Breath    Pt states she has SOB with exertion and occasionally with shopping. Pt states that she uses the albuterol  inhaler before exerting herself, uses a few times a week.    Obstructive Sleep Apnea    Pt states Dr. Meade recommending her to get a new mask fitting, but pt has not completed this yet. States she uses it most nights.     HPI Sydney Robinson is a 78 y.o. female with Hypertension, diastolic dysfunction, OSA on CPAP presents as a follow-up. Was following Dr. Meade before.  Respective notes from provider reviewed as appropriate to gather relevant information for patient care.   Discussed the use of AI scribe software for clinical note transcription with the patient, who gave verbal consent to proceed.  History of Present Illness   Sydney Robinson is a 78 year old female with sleep apnea who presents for follow-up.  She uses a CPAP machine with nasal pillows but finds it uncomfortable due to air leaks, especially when side sleeping. Nasal congestion and itching are present, which she attributes to CPAP use. She has not tried a chin strap yet.  She experiences intermittent shortness of breath over the past two to four years, noticeable during activities like walking one block or more and sometimes when talking on the phone. There is no specific seasonal variation or known triggers such as strong smells or smoke. She uses an albuterol  inhaler primarily before exercise at the [facility], which helps alleviate symptoms. No history of asthma, ventilator use, or steroid treatment for breathing issues.  She has post-nasal drip and phlegm production, which has improved with Flonase  nasal spray. No cough, chest pain, or sinus headaches. She has a history of seasonal allergies,  particularly to ragweed and tree pollen.  Her sleep pattern includes going to bed around 8:30 to 9:00 PM, but she often falls asleep by 10:30 to 11:00 PM. She wakes up multiple times during the night, sometimes staying awake for extended periods. She occasionally uses a small dose of clonazepam  to help with sleep, approximately once every three to four months.      ASTHMA:  First diagnosed: not diagnosed.  FH: no Triggers: no Intubated: no Last steroid use: never Times albuterol  used: using it before exercise.  Treatment: albuterol  prn.  Others: allergy symptoms- yes, throat choking/stridor- no, GERD- no, OSA- yes  ET: 1 block or if not will be sob.   OSA history: Dx in 2021>on CPAP nasal pillows.   PAP download compliance data: [employer] October-November 2025 Encore/Air[employer] ResMed 10 Pressure: 5-15, median 8.8, max 14.2. Hours of usage: 7 hours 29-minute Days used >4hr: 28/30 Leak: Significant leak around the mask. AHI: 2.7   PRIOR TESTS and IMAGING: PSG/HSAT: HST June 2021: AHI 8.6, desaturation 2.1-minute, O2 nadir 82, weight 202 pound.  Echo May 2021: EF 60%, grade 1 diastolic dysfunction no significant valvular abnormalities.  PFT November 2025: No obstruction, positive BD response, mild restriction with TLC 81% predicted, ERV 70% predicted, normal DLCO.    Allergies: Shellfish allergy, Hydrochlorothiazide , and Ramipril   Current Outpatient Medications:    albuterol  (VENTOLIN  HFA) 108 (90 Base) MCG/ACT inhaler, Inhale 2 puffs into the lungs every 6 (six) hours as needed for wheezing or shortness of breath.,  Disp: 18 each, Rfl: 5   Biotin 1000 MCG tablet, as needed., Disp: , Rfl:    Cholecalciferol (VITAMIN D3 PO), Take 1 tablet by mouth as needed. , Disp: , Rfl:    fexofenadine (ALLEGRA) 60 MG tablet, Take 60 mg by mouth 2 (two) times daily. (Patient taking differently: Take 60 mg by mouth daily.), Disp: , Rfl:    fluticasone  (FLONASE ) 50 MCG/ACT nasal spray, Place  1 spray into both nostrils daily. (Patient taking differently: Place 1 spray into both nostrils as needed.), Disp: 16 g, Rfl: 5   losartan  (COZAAR ) 100 MG tablet, TAKE 1 TABLET BY MOUTH EVERY DAY, Disp: 90 tablet, Rfl: 1   montelukast  (SINGULAIR ) 10 MG tablet, Take 1 tablet (10 mg total) by mouth at bedtime., Disp: 30 tablet, Rfl: 5   polycarbophil (FIBERCON) 625 MG tablet, Take 625 mg by mouth daily., Disp: , Rfl:    simvastatin  (ZOCOR ) 40 MG tablet, TAKE 1 TABLET BY MOUTH EVERYDAY AT BEDTIME, Disp: 90 tablet, Rfl: 3   clonazePAM  (KLONOPIN ) 0.5 MG tablet, TAKE 1/2-1 TABLET AT BEDTIME AS NEEDED (Patient not taking: Reported on 12/03/2023), Disp: 30 tablet, Rfl: 0 Past Medical History:  Diagnosis Date   Asthma    Colon polyps 1999   adenomatous   Diverticulosis    GERD (gastroesophageal reflux disease)    Heart murmur    Hyperlipidemia    Hypertension    Sleep apnea    Past Surgical History:  Procedure Laterality Date   BREAST EXCISIONAL BIOPSY Bilateral 1970s   CATARACT EXTRACTION Right    COLONOSCOPY W/ POLYPECTOMY  1998    negative 2003 & 2008; 2013   DILATION AND CURETTAGE OF UTERUS     x 3   fibroidectomy     x3   g2 p2     hemorrhoidal banding  2011   Dr Luis   Neuroma Resected Foot     Right    OVARIAN CYST REMOVAL     ROTATOR CUFF REPAIR     TONSILLECTOMY     Family History  Problem Relation Age of Onset   Diabetes Mother    Alzheimer's disease Mother    Heart failure Mother    Glaucoma Mother    Arthritis Mother    Colon polyps Sister    Breast cancer Sister 71   Heart disease Sister    Bladder Cancer Sister    Diverticulitis Sister    Breast cancer Sister 34   Glaucoma Sister    Lung cancer Sister    Heart attack Maternal Aunt 59   Diabetes Maternal Aunt    Diabetes Maternal Uncle    Heart attack Maternal Uncle    Stroke Paternal Aunt 34       cns aneurysm   Uterine cancer Paternal Aunt    Diabetes Paternal Aunt    Diabetes Maternal Grandmother     Stroke Maternal Grandfather    Diabetes Other        MGGM   Colon cancer Other        cousin   Esophageal cancer Neg Hx    Stomach cancer Neg Hx    Rectal cancer Neg Hx    Social History   Socioeconomic History   Marital status: Widowed    Spouse name: Not on file   Number of children: Not on file   Years of education: Not on file   Highest education level: 12th grade  Occupational History   Not on file  Tobacco Use  Smoking status: Never   Smokeless tobacco: Never  Vaping Use   Vaping status: Never Used  Substance and Sexual Activity   Alcohol use: No    Alcohol/week: 0.0 standard drinks of alcohol    Comment: Wine rarely   Drug use: No   Sexual activity: Not on file  Other Topics Concern   Not on file  Social History Narrative   Not on file   Social Drivers of Health   Financial Resource Strain: Low Risk  (03/17/2023)   Overall Financial Resource Strain (CARDIA)    Difficulty of Paying Living Expenses: Not hard at all  Food Insecurity: No Food Insecurity (03/17/2023)   Hunger Vital Sign    Worried About Running Out of Food in the Last Year: Never true    Ran Out of Food in the Last Year: Never true  Transportation Needs: No Transportation Needs (03/17/2023)   PRAPARE - Administrator, Civil Service (Medical): No    Lack of Transportation (Non-Medical): No  Physical Activity: Insufficiently Active (03/17/2023)   Exercise Vital Sign    Days of Exercise per Week: 2 days    Minutes of Exercise per Session: 40 min  Stress: No Stress Concern Present (03/17/2023)   [employer] of Occupational Health - Occupational Stress Questionnaire    Feeling of Stress : Only a little  Social Connections: Unknown (03/17/2023)   Social Connection and Isolation Panel    Frequency of Communication with Friends and Family: More than three times a week    Frequency of Social Gatherings with Friends and Family: More than three times a week    Attends Religious Services:  Patient declined    Database Administrator or Organizations: Yes    Attends Banker Meetings: More than 4 times per year    Marital Status: Widowed  Intimate Partner Violence: Not on file       Objective:  BP 116/82   Pulse 74   Temp 98.3 F (36.8 C)   Ht 5' 7 (1.702 m) Comment: Per pt  Wt 194 lb 6.4 oz (88.2 kg)   SpO2 96% Comment: RA  BMI 30.45 kg/m  BMI Readings from Last 3 Encounters:  12/03/23 30.45 kg/m  11/18/23 30.48 kg/m  10/04/23 30.23 kg/m    Physical Exam: Physical Exam   ENT: Nasal mucosa with barkiness. No hypertrophy of inferior turbinates. Tonsils are normal sized. Modified Mallampati score is normal. PULMONARY: Lungs clear to auscultation bilaterally, no adventitious breath sounds. CARDIOVASCULAR: Regular rate and rhythm, S1 S2 normal, no murmurs. ABDOMEN: Abdomen soft, nontender. Bowel sounds are normal. EXTREMITIES: No peripheral edema noted.       Diagnostic Review:  Last metabolic panel Lab Results  Component Value Date   GLUCOSE 107 (H) 04/24/2023   NA 140 04/24/2023   K 4.2 04/24/2023   CL 105 04/24/2023   CO2 26 04/24/2023   BUN 12 04/24/2023   CREATININE 0.97 04/24/2023   GFR 56.85 (L) 04/24/2023   CALCIUM 9.6 04/24/2023   PROT 6.5 02/08/2023   ALBUMIN 4.4 02/08/2023   BILITOT 0.9 02/08/2023   ALKPHOS 57 02/08/2023   AST 15 02/08/2023   ALT 9 02/08/2023         Assessment & Plan:   Assessment & Plan OSA (obstructive sleep apnea)     Class 1 obesity     Allergic rhinitis due to pollen, unspecified seasonality     Exercise-induced asthma Potential EIB. Though no classic s/s of asthma.  Less likely cardiac etiology of intermittent DOE.        Assessment and Plan     Mild Obstructive sleep apnea Significant air leakage from nasal pillows due to mouth breathing. Discomfort with nasal pillows and reluctance to use full or hybrid mask. No daytime fatigue. - Ordered chin strap to address air leakage.  Cont nasal mask.  - Requested mask fitting with DME company. - side sleep  Exercise-induced bronchospasm Intermittent shortness of breath during physical activity. No asthma diagnosis. Pulmonary function test showed mild restriction (though per prior definition does not have restriction- likely related to body habitus). - Prescribed Singulair  to assess improvement in exercise-induced symptoms. - Continue albuterol  inhaler as needed.  Allergic rhinitis due to pollen Post-nasal drip and nasal congestion improved with Flonase . No sinus headaches or infections. - Continue Flonase  nasal spray. - Use saline nasal spray as needed.       Notes from Dr Meade 11/19/23 reviewed as to gather relevant information for patient care and formulating plan.   Return in about 6 months (around 06/01/2024).   I personally spent a total of 30 minutes in the care of the patient today including preparing to see the patient, getting/reviewing separately obtained history, performing a medically appropriate exam/evaluation, counseling and educating, placing orders, documenting clinical information in the EHR, independently interpreting results, and communicating results.   Shakeema Lippman, MD

## 2023-12-23 ENCOUNTER — Other Ambulatory Visit: Payer: Self-pay | Admitting: Surgery

## 2023-12-30 ENCOUNTER — Encounter (HOSPITAL_BASED_OUTPATIENT_CLINIC_OR_DEPARTMENT_OTHER): Payer: Self-pay | Admitting: Surgery

## 2024-01-08 NOTE — H&P (Signed)
 "  PROVIDER: VICENTA DASIE POLI, MD  MRN: [id number] DOB: [birth year] DATE OF ENCOUNTER: 12/23/2023 Subjective   Chief Complaint: Follow-up   History of Present Illness: Sydney Robinson is a 78 y.o. female who is seen today for a possible recurrent right axillary mass. She has had a previous sebaceous cyst excised from her right axilla. She reports that she is had a recurrent mass which is now painful and burning. She has not had any drainage from the area. She is otherwise complaints.    Review of Systems: A complete review of systems was obtained from the patient. I have reviewed this information and discussed as appropriate with the patient. See HPI as well for other ROS.  ROS   Medical History: Past Medical History:  Diagnosis Date  Arthritis  Asthma, unspecified asthma severity, unspecified whether complicated, unspecified whether persistent (HHS-HCC)  GERD (gastroesophageal reflux disease)  Hyperlipidemia  Hypertension  Sleep apnea   There is no problem list on file for this patient.  Past Surgical History:  Procedure Laterality Date  COLONOSCOPY 1998  HEMORRHOIDECTOMY INTERNAL & EXTERNAL 2011  BREAST CYST SURGERY  BIOPSY - 1970s  CATARACT EXTRACTION  DILATION AND CURETTAGE, DIAGNOSTIC / THERAPEUTIC  FIBROIDECTOMY  FOOT SURGERY  OVARIAN CYST SURGERY  TONSILLECTOMY    Allergies  Allergen Reactions  Crab Other (See Comments)  Shellfish Containing Products Diarrhea, Nausea And Vomiting and Nausea  Per patient, only crab meat  Hydrochlorothiazide  Other (See Comments)  Lightheadedness at 12.5 mg   Current Outpatient Medications on File Prior to Visit  Medication Sig Dispense Refill  albuterol  MDI, PROVENTIL , VENTOLIN , PROAIR , HFA 90 mcg/actuation inhaler TAKE 2 PUFFS BY MOUTH EVERY 6 HOURS AS NEEDED FOR WHEEZE OR SHORTNESS OF BREATH  clonazePAM  (KLONOPIN ) 0.5 MG tablet TAKE 1/2-1 TABLET AT BEDTIME AS NEEDED  fexofenadine (ALLEGRA) 60 MG tablet Take 60 mg by  mouth 2 (two) times daily  labetaloL  (TRANDATE ) 300 MG tablet TAKE HALF A TABLETS (150 MG TOTAL) BY MOUTH 2 (TWO) TIMES DAILY.  simvastatin  (ZOCOR ) 40 MG tablet Take 1 tablet by mouth at bedtime   No current facility-administered medications on file prior to visit.   History reviewed. No pertinent family history.   Social History   Tobacco Use  Smoking Status Never  Smokeless Tobacco Never    Social History   Socioeconomic History  Marital status: Widowed  Tobacco Use  Smoking status: Never  Smokeless tobacco: Never  Substance and Sexual Activity  Alcohol use: Not Currently  Drug use: Never   Social Drivers of Health   Financial Resource Strain: Low Risk (03/17/2023)  Received from [facility]  Overall Financial Resource Strain (CARDIA)  Difficulty of Paying Living Expenses: Not hard at all  Food Insecurity: No Food Insecurity (03/17/2023)  Received from [facility]  Hunger Vital Sign  Within the past 12 months, you worried that your food would run out before you got the money to buy more.: Never true  Within the past 12 months, the food you bought just didn't last and you didn't have money to get more.: Never true  Transportation Needs: No Transportation Needs (03/17/2023)  Received from [facility] - Transportation  Lack of Transportation (Medical): No  Lack of Transportation (Non-Medical): No  Physical Activity: Insufficiently Active (03/17/2023)  Received from [facility]  Exercise Vital Sign  On average, how many days per week do you engage in moderate to strenuous exercise (like a brisk walk)?: 2 days  On average, how many minutes do  you engage in exercise at this level?: 40 min  Stress: No Stress Concern Present (03/17/2023)  Received from [facility] - Occupational Stress Questionnaire  Feeling of Stress : Only a little  Social Connections: Unknown (03/17/2023)  Received from [facility]  Social Connection and  Isolation Panel  In a typical week, how many times do you talk on the phone with family, friends, or neighbors?: More than three times a week  How often do you get together with friends or relatives?: More than three times a week  How often do you attend church or religious services?: Patient declined  Do you belong to any clubs or organizations such as church groups, unions, fraternal or athletic groups, or school groups?: Yes  How often do you attend meetings of the clubs or organizations you belong to?: More than 4 times per year  Are you married, widowed, divorced, separated, never married, or living with a partner?: Widowed  Housing Stability: Unknown (12/23/2023)  Housing Stability Vital Sign  Homeless in the Last Year: No   Objective:   Vitals:  12/23/23 1543  PainSc: 0-No pain   There is no height or weight on file to calculate BMI.  Physical Exam   She appears well on exam  In her right axilla, at the site of her previous excision there is a 2 cm mass which is tender but not erythematous. 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 As it is quite tender and causing discomfort, surgical excision of the right axillary mass to relieve her discomfort and for histologic evaluation to rule out a malignancy. We again discussed the procedure in detail including the risks which include but are not limited to bleeding, recurrence, injury to surrounding structures, excetra. Again she was to proceed with surgery which will be scheduled  "

## 2024-01-09 NOTE — Anesthesia Preprocedure Evaluation (Signed)
"                                    Anesthesia Evaluation  Patient identified by MRN, date of birth, ID band Patient awake    Reviewed: Allergy & Precautions, NPO status , Patient's Chart, lab work & pertinent test results  History of Anesthesia Complications Negative for: history of anesthetic complications  Airway Mallampati: II  TM Distance: >3 FB Neck ROM: Full    Dental  (+) Missing,    Pulmonary asthma , sleep apnea    Pulmonary exam normal        Cardiovascular hypertension, Pt. on medications Normal cardiovascular exam     Neuro/Psych negative neurological ROS     GI/Hepatic Neg liver ROS,GERD  ,,  Endo/Other  negative endocrine ROS    Renal/GU negative Renal ROS     Musculoskeletal  (+) Arthritis ,    Abdominal   Peds  Hematology negative hematology ROS (+)   Anesthesia Other Findings RIGHT AXILLARY MASS  Reproductive/Obstetrics                              Anesthesia Physical Anesthesia Plan  ASA: 2  Anesthesia Plan: General   Post-op Pain Management: Tylenol  PO (pre-op)*   Induction: Intravenous  PONV Risk Score and Plan: 3 and Treatment may vary due to age or medical condition, Ondansetron and Dexamethasone  Airway Management Planned: LMA  Additional Equipment: None  Intra-op Plan:   Post-operative Plan: Extubation in OR  Informed Consent: I have reviewed the patients History and Physical, chart, labs and discussed the procedure including the risks, benefits and alternatives for the proposed anesthesia with the patient or authorized representative who has indicated his/her understanding and acceptance.     Dental advisory given  Plan Discussed with: CRNA  Anesthesia Plan Comments:          Anesthesia Quick Evaluation  "

## 2024-01-10 ENCOUNTER — Encounter (HOSPITAL_BASED_OUTPATIENT_CLINIC_OR_DEPARTMENT_OTHER): Payer: Self-pay | Admitting: Surgery

## 2024-01-10 ENCOUNTER — Ambulatory Visit (HOSPITAL_BASED_OUTPATIENT_CLINIC_OR_DEPARTMENT_OTHER)
Admission: RE | Admit: 2024-01-10 | Discharge: 2024-01-10 | Disposition: A | Discharge status: Home / Self Care | Attending: Surgery | Admitting: Surgery

## 2024-01-10 ENCOUNTER — Other Ambulatory Visit: Payer: Self-pay

## 2024-01-10 ENCOUNTER — Ambulatory Visit (HOSPITAL_BASED_OUTPATIENT_CLINIC_OR_DEPARTMENT_OTHER): Payer: Self-pay

## 2024-01-10 ENCOUNTER — Encounter (HOSPITAL_BASED_OUTPATIENT_CLINIC_OR_DEPARTMENT_OTHER)
Admission: RE | Disposition: A | Discharge status: Home / Self Care | Payer: Self-pay | Source: Home / Self Care | Attending: Surgery

## 2024-01-10 DIAGNOSIS — R2231 Localized swelling, mass and lump, right upper limb: Secondary | ICD-10-CM

## 2024-01-10 DIAGNOSIS — K219 Gastro-esophageal reflux disease without esophagitis: Secondary | ICD-10-CM | POA: Diagnosis not present

## 2024-01-10 DIAGNOSIS — R222 Localized swelling, mass and lump, trunk: Secondary | ICD-10-CM | POA: Diagnosis present

## 2024-01-10 DIAGNOSIS — M1990 Unspecified osteoarthritis, unspecified site: Secondary | ICD-10-CM | POA: Diagnosis not present

## 2024-01-10 DIAGNOSIS — I10 Essential (primary) hypertension: Secondary | ICD-10-CM | POA: Insufficient documentation

## 2024-01-10 DIAGNOSIS — L905 Scar conditions and fibrosis of skin: Secondary | ICD-10-CM | POA: Insufficient documentation

## 2024-01-10 DIAGNOSIS — G4730 Sleep apnea, unspecified: Secondary | ICD-10-CM | POA: Diagnosis not present

## 2024-01-10 DIAGNOSIS — J45909 Unspecified asthma, uncomplicated: Secondary | ICD-10-CM | POA: Diagnosis not present

## 2024-01-10 DIAGNOSIS — Z01818 Encounter for other preprocedural examination: Secondary | ICD-10-CM

## 2024-01-10 HISTORY — PX: EXCISION, MASS, UPPER EXTREMITY: SHX7567

## 2024-01-10 SURGERY — EXCISION, MASS, UPPER EXTREMITY
Anesthesia: General | Site: Axilla | Laterality: Right

## 2024-01-10 MED ORDER — EPHEDRINE SULFATE (PRESSORS) 25 MG/5ML IV SOSY
PREFILLED_SYRINGE | INTRAVENOUS | Status: DC | PRN
  Administered 2024-01-10 (×2): 10 mg via INTRAVENOUS

## 2024-01-10 MED ORDER — ACETAMINOPHEN 500 MG PO TABS: 1000.0000 mg | ORAL_TABLET | Freq: Once | ORAL | Status: AC

## 2024-01-10 MED ORDER — OXYCODONE HCL 5 MG PO TABS: 5.0000 mg | ORAL_TABLET | Freq: Once | ORAL | Status: DC | PRN

## 2024-01-10 MED ORDER — ACETAMINOPHEN 500 MG PO TABS
ORAL_TABLET | ORAL | Status: AC
  Filled 2024-01-10: qty 2

## 2024-01-10 MED ORDER — FENTANYL CITRATE (PF) 100 MCG/2ML IJ SOLN
INTRAMUSCULAR | Status: AC
  Filled 2024-01-10: qty 2

## 2024-01-10 MED ORDER — CHLORHEXIDINE GLUCONATE CLOTH 2 % EX PADS: 6.0000 | MEDICATED_PAD | Freq: Once | CUTANEOUS | Status: DC

## 2024-01-10 MED ORDER — OXYCODONE HCL 5 MG/5ML PO SOLN: 5.0000 mg | Freq: Once | ORAL | Status: DC | PRN

## 2024-01-10 MED ORDER — ACETAMINOPHEN 500 MG PO TABS
1000.0000 mg | ORAL_TABLET | ORAL | Status: AC
  Administered 2024-01-10: 1000 mg via ORAL

## 2024-01-10 MED ORDER — ONDANSETRON HCL 4 MG/2ML IJ SOLN
INTRAMUSCULAR | Status: DC | PRN
  Administered 2024-01-10: 4 mg via INTRAVENOUS

## 2024-01-10 MED ORDER — CEFAZOLIN SODIUM-DEXTROSE 2-4 GM/100ML-% IV SOLN
2.0000 g | INTRAVENOUS | Status: AC
  Administered 2024-01-10: 2 g via INTRAVENOUS

## 2024-01-10 MED ORDER — BUPIVACAINE-EPINEPHRINE 0.5% -1:200000 IJ SOLN
INTRAMUSCULAR | Status: DC | PRN
  Administered 2024-01-10: 20 mL

## 2024-01-10 MED ORDER — DROPERIDOL 2.5 MG/ML IJ SOLN: 0.6250 mg | Freq: Once | INTRAMUSCULAR | Status: DC | PRN

## 2024-01-10 MED ORDER — FENTANYL CITRATE (PF) 100 MCG/2ML IJ SOLN: 25.0000 ug | INTRAMUSCULAR | Status: DC | PRN

## 2024-01-10 MED ORDER — 0.9 % SODIUM CHLORIDE (POUR BTL) OPTIME
TOPICAL | Status: DC | PRN
  Administered 2024-01-10: 500 mL

## 2024-01-10 MED ORDER — PROPOFOL 10 MG/ML IV BOLUS
INTRAVENOUS | Status: DC | PRN
  Administered 2024-01-10: 30 mg via INTRAVENOUS
  Administered 2024-01-10: 170 mg via INTRAVENOUS

## 2024-01-10 MED ORDER — LACTATED RINGERS IV SOLN: INTRAVENOUS | Status: DC

## 2024-01-10 MED ORDER — LIDOCAINE HCL (CARDIAC) PF 100 MG/5ML IV SOSY
PREFILLED_SYRINGE | INTRAVENOUS | Status: DC | PRN
  Administered 2024-01-10: 100 mg via INTRAVENOUS

## 2024-01-10 MED ORDER — FENTANYL CITRATE (PF) 100 MCG/2ML IJ SOLN
INTRAMUSCULAR | Status: DC | PRN
  Administered 2024-01-10: 50 ug via INTRAVENOUS
  Administered 2024-01-10 (×2): 25 ug via INTRAVENOUS

## 2024-01-10 MED ORDER — TRAMADOL HCL 50 MG PO TABS: 50.0000 mg | ORAL_TABLET | Freq: Four times a day (QID) | ORAL | 0 refills | Status: AC | PRN

## 2024-01-10 MED ORDER — DEXAMETHASONE SODIUM PHOSPHATE 4 MG/ML IJ SOLN
INTRAMUSCULAR | Status: DC | PRN
  Administered 2024-01-10: 5 mg via INTRAVENOUS

## 2024-01-10 MED ORDER — ENSURE PRE-SURGERY PO LIQD: 296.0000 mL | Freq: Once | ORAL | Status: DC

## 2024-01-10 MED ORDER — CEFAZOLIN SODIUM-DEXTROSE 2-4 GM/100ML-% IV SOLN
INTRAVENOUS | Status: AC
  Filled 2024-01-10: qty 100

## 2024-01-10 SURGICAL SUPPLY — 34 items
BLADE SURG 15 STRL LF DISP TIS (BLADE) ×1 IMPLANT
CANISTER SUCT 1200ML W/VALVE (MISCELLANEOUS) ×1 IMPLANT
CHLORAPREP W/TINT 26 (MISCELLANEOUS) ×1 IMPLANT
CLIP APPLIE 9.375 MED OPEN (MISCELLANEOUS) IMPLANT
COVER BACK TABLE 60X90IN (DRAPES) ×1 IMPLANT
COVER MAYO STAND STRL (DRAPES) ×1 IMPLANT
DERMABOND ADVANCED .7 DNX12 (GAUZE/BANDAGES/DRESSINGS) ×2 IMPLANT
DRAIN CHANNEL 19F RND (DRAIN) IMPLANT
DRAIN PENROSE 12X.25 LTX STRL (MISCELLANEOUS) IMPLANT
DRAPE LAPAROSCOPIC ABDOMINAL (DRAPES) IMPLANT
DRAPE LAPAROTOMY 100X72 PEDS (DRAPES) ×1 IMPLANT
DRAPE UTILITY XL STRL (DRAPES) ×1 IMPLANT
ELECTRODE REM PT RTRN 9FT ADLT (ELECTROSURGICAL) ×1 IMPLANT
EVACUATOR SILICONE 100CC (DRAIN) IMPLANT
GLOVE SURG SIGNA 7.5 PF LTX (GLOVE) ×1 IMPLANT
GOWN STRL REUS W/ TWL LRG LVL3 (GOWN DISPOSABLE) ×1 IMPLANT
GOWN STRL REUS W/ TWL XL LVL3 (GOWN DISPOSABLE) ×1 IMPLANT
NEEDLE HYPO 25X1 1.5 SAFETY (NEEDLE) ×1 IMPLANT
PACK BASIN DAY SURGERY FS (CUSTOM PROCEDURE TRAY) ×1 IMPLANT
PENCIL SMOKE EVACUATOR (MISCELLANEOUS) ×1 IMPLANT
PIN SAFETY STERILE (MISCELLANEOUS) ×1 IMPLANT
SLEEVE SCD COMPRESS KNEE MED (STOCKING) ×1 IMPLANT
SOLN 0.9% NACL POUR BTL 1000ML (IV SOLUTION) ×1 IMPLANT
SPIKE FLUID TRANSFER (MISCELLANEOUS) IMPLANT
SPONGE T-LAP 4X18 ~~LOC~~+RFID (SPONGE) ×1 IMPLANT
STAPLER SKIN PROX WIDE 3.9 (STAPLE) IMPLANT
SUT ETHILON 2 0 FS 18 (SUTURE) IMPLANT
SUT MNCRL AB 4-0 PS2 18 (SUTURE) ×1 IMPLANT
SUT VIC AB 3-0 SH 27X BRD (SUTURE) IMPLANT
SYR BULB EAR ULCER 3OZ GRN STR (SYRINGE) IMPLANT
SYR CONTROL 10ML LL (SYRINGE) ×1 IMPLANT
TOWEL GREEN STERILE FF (TOWEL DISPOSABLE) ×1 IMPLANT
TUBE CONNECTING 20X1/4 (TUBING) ×1 IMPLANT
YANKAUER SUCT BULB TIP NO VENT (SUCTIONS) ×1 IMPLANT

## 2024-01-10 NOTE — Discharge Instructions (Addendum)
 You may shower starting tomorrow  Ice pack, tylenol , and ibuprofen also for pain  No vigorous activity for 2 weeks   Post Anesthesia Home Care Instructions  Activity: Get plenty of rest for the remainder of the day. A responsible individual must stay with you for 24 hours following the procedure.  For the next 24 hours, DO NOT: -Drive a car -Advertising copywriter -Drink alcoholic beverages -Take any medication unless instructed by your physician -Make any legal decisions or sign important papers.  Meals: Start with liquid foods such as gelatin or soup. Progress to regular foods as tolerated. Avoid greasy, spicy, heavy foods. If nausea and/or vomiting occur, drink only clear liquids until the nausea and/or vomiting subsides. Call your physician if vomiting continues.  Special Instructions/Symptoms: Your throat may feel dry or sore from the anesthesia or the breathing tube placed in your throat during surgery. If this causes discomfort, gargle with warm salt water. The discomfort should disappear within 24 hours.  Next dose of tylenol  will be at 11:30pm today

## 2024-01-10 NOTE — Anesthesia Procedure Notes (Signed)
 Procedure Name: LMA Insertion Date/Time: 01/10/2024 9:39 AM  Performed by: Pam Macario BROCKS, CRNAPre-anesthesia Checklist: Patient identified, Emergency Drugs available, Suction available, Patient being monitored and Timeout performed Patient Re-evaluated:Patient Re-evaluated prior to induction Oxygen Delivery Method: Circle system utilized Preoxygenation: Pre-oxygenation with 100% oxygen Induction Type: IV induction Ventilation: Mask ventilation without difficulty LMA: LMA inserted LMA Size: 4.0 Number of attempts: 1 Airway Equipment and Method: Bite block Placement Confirmation: positive ETCO2, breath sounds checked- equal and bilateral and CO2 detector Tube secured with: Tape Dental Injury: Teeth and Oropharynx as per pre-operative assessment

## 2024-01-10 NOTE — Anesthesia Postprocedure Evaluation (Signed)
"   Anesthesia Post Note  Patient: Sydney Robinson  Procedure(s) Performed: EXCISION, MASS, UPPER EXTREMITY (Right: Axilla)     Patient location during evaluation: PACU Anesthesia Type: General Level of consciousness: awake and alert Pain management: pain level controlled Vital Signs Assessment: post-procedure vital signs reviewed and stable Respiratory status: spontaneous breathing, nonlabored ventilation and respiratory function stable Cardiovascular status: blood pressure returned to baseline Postop Assessment: no apparent nausea or vomiting Anesthetic complications: no   No notable events documented.  Last Vitals:  Vitals:   01/10/24 1015 01/10/24 1030  BP: (!) 114/58 119/62  Pulse: 89 77  Resp: 17 13  Temp: (!) 36.2 C   SpO2: 100% 100%    Last Pain:  Vitals:   01/10/24 1015  PainSc: 0-No pain                 [name]      "

## 2024-01-10 NOTE — Transfer of Care (Signed)
 Immediate Anesthesia Transfer of Care Note  Patient: Sydney Robinson  Procedure(s) Performed: EXCISION, MASS, UPPER EXTREMITY (Right: Axilla)  Patient Location: PACU  Anesthesia Type:General  Level of Consciousness: awake and alert   Airway & Oxygen Therapy: Patient Spontanous Breathing and Patient connected to face mask oxygen  Post-op Assessment: Report given to RN and Post -op Vital signs reviewed and stable  Post vital signs: Reviewed and stable  Last Vitals:  Vitals Value Taken Time  BP 114/58 01/10/24 10:15  Temp    Pulse 85 01/10/24 10:17  Resp 14 01/10/24 10:17  SpO2 100 % 01/10/24 10:17  Vitals shown include unfiled device data.  Last Pain: There were no vitals filed for this visit.       Complications: No notable events documented.

## 2024-01-10 NOTE — Interval H&P Note (Signed)
 History and Physical Interval Note:no change in H and p  01/10/2024 9:23 AM  Sydney Robinson  has presented today for surgery, with the diagnosis of RIGHT AXILLARY MASS.  The various methods of treatment have been discussed with the patient and family. After consideration of risks, benefits and other options for treatment, the patient has consented to  Procedures: EXCISION, MASS, UPPER EXTREMITY (Right) as a surgical intervention.  The patient's history has been reviewed, patient examined, no change in status, stable for surgery.  I have reviewed the patient's chart and labs.  Questions were answered to the patient's satisfaction.     Vicenta Poli

## 2024-01-10 NOTE — Op Note (Signed)
" ° °  Sydney Robinson 01/10/2024    Pre-op Diagnosis: RIGHT AXILLARY MASS (2 CM)     Post-op Diagnosis: SAME  Procedures: EXCISION 2 CM RIGHT AXILLARY MASS   Surgeon(s): Vernetta Berg, MD  Anesthesia: General  Staff:  Circulator: Jorja Arland CHRISTELLA, RN Scrub Person: Arbutus Suzen CHRISTELLA  Estimated Blood Loss: Minimal               Specimens: SENT TO PATH  Findings: The patient was found to have a 2 cm axillary mass with overlying keloid consistent with a possible recurrent sebaceous cyst  Procedure: The patient was brought to the op room identified the correct patient.  She was placed upon the operating table general esthesia was induced.  Her right axilla was prepped and draped in usual sterile fashion.  At her previous sebaceous cyst removal site there was a hard keloid with what appeared to be an underlying mass measuring about 2 cm.  I anesthetized around skin with Marcaine and performed elliptical incision to remove the previous scar and keloid with a scalpel.  I then dissected down to the subcutaneous tissue circumferentially with electrocautery.  I then removed the mass and the overlying skin in its entirety with electrocautery.  This was then sent to pathology for evaluation.  I achieved hemostasis with the cautery.  I injected the incision further with Marcaine.  I then closed the subcutaneous tissue with interrupted 3-0 Vicryl sutures and closed the skin with a running 4-0 Monocryl.  Dermabond was then applied.  The patient tolerated the procedure well.  All the counts were correct at the end of the procedure.  The patient was then extubated in the operating room and taken in a stable condition to the recovery room.          Berg Vernetta   Date: 01/10/2024  Time: 10:09 AM    "

## 2024-01-11 ENCOUNTER — Encounter (HOSPITAL_BASED_OUTPATIENT_CLINIC_OR_DEPARTMENT_OTHER): Payer: Self-pay | Admitting: Surgery

## 2024-01-13 LAB — SURGICAL PATHOLOGY

## 2024-02-08 ENCOUNTER — Other Ambulatory Visit: Payer: Self-pay | Admitting: Internal Medicine

## 2024-02-11 ENCOUNTER — Encounter: Payer: Medicare Other | Admitting: Internal Medicine

## 2024-02-12 ENCOUNTER — Other Ambulatory Visit: Payer: Self-pay | Admitting: Internal Medicine

## 2024-02-12 DIAGNOSIS — Z1231 Encounter for screening mammogram for malignant neoplasm of breast: Secondary | ICD-10-CM

## 2024-03-26 ENCOUNTER — Ambulatory Visit

## 2024-06-08 ENCOUNTER — Encounter: Admitting: Internal Medicine
